# Patient Record
Sex: Female | Born: 1935
Health system: Southern US, Community
[De-identification: ages and names within clinical notes are randomized; demographics above are authoritative.]

## PROBLEM LIST (undated history)

## (undated) DIAGNOSIS — J309 Allergic rhinitis, unspecified: Secondary | ICD-10-CM

## (undated) DIAGNOSIS — R55 Syncope and collapse: Secondary | ICD-10-CM

## (undated) DIAGNOSIS — I10 Essential (primary) hypertension: Secondary | ICD-10-CM

## (undated) DIAGNOSIS — M159 Polyosteoarthritis, unspecified: Secondary | ICD-10-CM

## (undated) DIAGNOSIS — F419 Anxiety disorder, unspecified: Secondary | ICD-10-CM

## (undated) DIAGNOSIS — N12 Tubulo-interstitial nephritis, not specified as acute or chronic: Secondary | ICD-10-CM

## (undated) DIAGNOSIS — E785 Hyperlipidemia, unspecified: Secondary | ICD-10-CM

## (undated) DIAGNOSIS — K635 Polyp of colon: Secondary | ICD-10-CM

## (undated) DIAGNOSIS — N6019 Diffuse cystic mastopathy of unspecified breast: Secondary | ICD-10-CM

## (undated) DIAGNOSIS — K219 Gastro-esophageal reflux disease without esophagitis: Secondary | ICD-10-CM

## (undated) DIAGNOSIS — E039 Hypothyroidism, unspecified: Secondary | ICD-10-CM

## (undated) DIAGNOSIS — R42 Dizziness and giddiness: Secondary | ICD-10-CM

## (undated) HISTORY — DX: Polyosteoarthritis, unspecified: M15.9

## (undated) HISTORY — DX: Anxiety disorder, unspecified: F41.9

## (undated) HISTORY — DX: Polyp of colon: K63.5

## (undated) HISTORY — DX: Essential (primary) hypertension: I10

## (undated) HISTORY — DX: Hyperlipidemia, unspecified: E78.5

## (undated) HISTORY — DX: Syncope and collapse: R55

## (undated) HISTORY — DX: Gastro-esophageal reflux disease without esophagitis: K21.9

## (undated) HISTORY — DX: Allergic rhinitis, unspecified: J30.9

## (undated) HISTORY — DX: Hypothyroidism, unspecified: E03.9

## (undated) HISTORY — DX: Tubulo-interstitial nephritis, not specified as acute or chronic: N12

## (undated) HISTORY — DX: Diffuse cystic mastopathy of unspecified breast: N60.19

## (undated) HISTORY — DX: Dizziness and giddiness: R42

---

## 1994-07-02 HISTORY — PX: THYROIDECTOMY, PARTIAL: SHX18

## 1999-03-03 ENCOUNTER — Encounter: Payer: Self-pay | Admitting: Internal Medicine

## 1999-03-03 LAB — CONVERTED CEMR LAB

## 2002-04-22 ENCOUNTER — Encounter: Payer: Self-pay | Admitting: Gastroenterology

## 2002-05-11 ENCOUNTER — Inpatient Hospital Stay (HOSPITAL_COMMUNITY): Admission: EM | Admit: 2002-05-11 | Discharge: 2002-05-12 | Payer: Self-pay | Admitting: Emergency Medicine

## 2003-10-25 ENCOUNTER — Encounter: Payer: Self-pay | Admitting: Gastroenterology

## 2004-07-12 ENCOUNTER — Ambulatory Visit: Payer: Self-pay | Admitting: Internal Medicine

## 2004-08-18 ENCOUNTER — Ambulatory Visit: Payer: Self-pay | Admitting: Internal Medicine

## 2004-08-24 ENCOUNTER — Ambulatory Visit: Payer: Self-pay | Admitting: Family Medicine

## 2005-01-10 ENCOUNTER — Ambulatory Visit: Payer: Self-pay | Admitting: Internal Medicine

## 2005-02-16 ENCOUNTER — Ambulatory Visit: Payer: Self-pay | Admitting: Internal Medicine

## 2005-03-08 ENCOUNTER — Encounter: Payer: Self-pay | Admitting: Internal Medicine

## 2005-03-19 ENCOUNTER — Ambulatory Visit: Payer: Self-pay | Admitting: Internal Medicine

## 2005-03-21 ENCOUNTER — Ambulatory Visit: Payer: Self-pay | Admitting: Internal Medicine

## 2005-07-19 ENCOUNTER — Ambulatory Visit: Payer: Self-pay | Admitting: Internal Medicine

## 2006-01-18 ENCOUNTER — Ambulatory Visit: Payer: Self-pay | Admitting: Internal Medicine

## 2006-05-21 ENCOUNTER — Ambulatory Visit: Payer: Self-pay | Admitting: Internal Medicine

## 2006-10-01 ENCOUNTER — Ambulatory Visit: Payer: Self-pay | Admitting: Internal Medicine

## 2006-10-01 LAB — CONVERTED CEMR LAB
Albumin: 4 g/dL (ref 3.5–5.2)
BUN: 14 mg/dL (ref 6–23)
CO2: 33 meq/L — ABNORMAL HIGH (ref 19–32)
Chloride: 100 meq/L (ref 96–112)
Creatinine, Ser: 0.9 mg/dL (ref 0.4–1.2)
GFR calc non Af Amer: 64 mL/min
Sodium: 140 meq/L (ref 135–145)

## 2007-02-21 ENCOUNTER — Encounter: Payer: Self-pay | Admitting: Internal Medicine

## 2007-03-17 ENCOUNTER — Encounter: Payer: Self-pay | Admitting: Internal Medicine

## 2007-03-17 DIAGNOSIS — Z860100 Personal history of colon polyps, unspecified: Secondary | ICD-10-CM | POA: Insufficient documentation

## 2007-03-17 DIAGNOSIS — N6019 Diffuse cystic mastopathy of unspecified breast: Secondary | ICD-10-CM

## 2007-03-17 DIAGNOSIS — I10 Essential (primary) hypertension: Secondary | ICD-10-CM

## 2007-03-17 DIAGNOSIS — K219 Gastro-esophageal reflux disease without esophagitis: Secondary | ICD-10-CM | POA: Insufficient documentation

## 2007-03-17 DIAGNOSIS — M810 Age-related osteoporosis without current pathological fracture: Secondary | ICD-10-CM | POA: Insufficient documentation

## 2007-04-02 ENCOUNTER — Ambulatory Visit: Payer: Self-pay | Admitting: Internal Medicine

## 2007-04-02 DIAGNOSIS — R32 Unspecified urinary incontinence: Secondary | ICD-10-CM

## 2007-10-06 ENCOUNTER — Ambulatory Visit: Payer: Self-pay | Admitting: Internal Medicine

## 2007-10-07 LAB — CONVERTED CEMR LAB
BUN: 23 mg/dL (ref 6–23)
CO2: 26 meq/L (ref 19–32)
Chloride: 104 meq/L (ref 96–112)
Creatinine, Ser: 0.79 mg/dL (ref 0.40–1.20)
Eosinophils Relative: 5 % (ref 0–5)
Glucose, Bld: 79 mg/dL (ref 70–99)
HCT: 41.8 % (ref 36.0–46.0)
Hemoglobin: 13.6 g/dL (ref 12.0–15.0)
Lymphocytes Relative: 32 % (ref 12–46)
Monocytes Absolute: 0.4 10*3/uL (ref 0.1–1.0)
Monocytes Relative: 8 % (ref 3–12)
Neutro Abs: 2.7 10*3/uL (ref 1.7–7.7)
RBC: 4.54 M/uL (ref 3.87–5.11)

## 2007-12-23 ENCOUNTER — Telehealth: Payer: Self-pay | Admitting: Internal Medicine

## 2008-04-16 ENCOUNTER — Ambulatory Visit: Payer: Self-pay | Admitting: Internal Medicine

## 2008-09-21 ENCOUNTER — Encounter (INDEPENDENT_AMBULATORY_CARE_PROVIDER_SITE_OTHER): Payer: Self-pay | Admitting: *Deleted

## 2008-09-22 ENCOUNTER — Ambulatory Visit: Payer: Self-pay | Admitting: Internal Medicine

## 2008-09-23 LAB — CONVERTED CEMR LAB
ALT: 22 units/L (ref 0–35)
Basophils Relative: 0.5 % (ref 0.0–3.0)
CO2: 30 meq/L (ref 19–32)
Chloride: 106 meq/L (ref 96–112)
Direct LDL: 181.6 mg/dL
Eosinophils Absolute: 0.2 10*3/uL (ref 0.0–0.7)
Free T4: 1 ng/dL (ref 0.6–1.6)
GFR calc non Af Amer: 50.52 mL/min (ref >60)
Glucose, Bld: 84 mg/dL (ref 70–99)
HCT: 37.2 % (ref 36.0–46.0)
HDL: 53.1 mg/dL (ref >39.00)
Hemoglobin: 12.6 g/dL (ref 12.0–15.0)
Lymphocytes Relative: 25.4 % (ref 12.0–46.0)
Monocytes Absolute: 0.6 10*3/uL (ref 0.1–1.0)
Neutrophils Relative %: 61.6 % (ref 43.0–77.0)
Platelets: 211 10*3/uL (ref 150.0–400.0)
Potassium: 4 meq/L (ref 3.5–5.1)
Sodium: 142 meq/L (ref 135–145)
TSH: 1.98 microintl units/mL (ref 0.35–5.50)
Total Bilirubin: 0.9 mg/dL (ref 0.3–1.2)
Total CHOL/HDL Ratio: 5
Triglycerides: 108 mg/dL (ref 0.0–149.0)
VLDL: 21.6 mg/dL (ref 0.0–40.0)

## 2009-03-22 ENCOUNTER — Encounter (INDEPENDENT_AMBULATORY_CARE_PROVIDER_SITE_OTHER): Payer: Self-pay | Admitting: *Deleted

## 2009-03-29 ENCOUNTER — Ambulatory Visit: Payer: Self-pay | Admitting: Internal Medicine

## 2009-03-30 ENCOUNTER — Telehealth: Payer: Self-pay | Admitting: Gastroenterology

## 2009-09-27 ENCOUNTER — Ambulatory Visit: Payer: Self-pay | Admitting: Internal Medicine

## 2009-09-28 LAB — CONVERTED CEMR LAB
ALT: 16 units/L (ref 0–35)
AST: 19 units/L (ref 0–37)
Albumin: 4.6 g/dL (ref 3.5–5.2)
Alkaline Phosphatase: 65 units/L (ref 39–117)
Basophils Absolute: 0 10*3/uL (ref 0.0–0.1)
Basophils Relative: 1 % (ref 0–1)
Free T4: 1.28 ng/dL (ref 0.80–1.80)
Glucose, Bld: 92 mg/dL (ref 70–99)
Hemoglobin: 14.5 g/dL (ref 12.0–15.0)
Lymphocytes Relative: 24 % (ref 12–46)
MCHC: 33.9 g/dL (ref 30.0–36.0)
Neutro Abs: 3.9 10*3/uL (ref 1.7–7.7)
Neutrophils Relative %: 64 % (ref 43–77)
Platelets: 182 10*3/uL (ref 150–400)
Potassium: 4 meq/L (ref 3.5–5.3)
RDW: 12.8 % (ref 11.5–15.5)
Sodium: 141 meq/L (ref 135–145)
Total Bilirubin: 0.5 mg/dL (ref 0.3–1.2)
Total Protein: 7.3 g/dL (ref 6.0–8.3)

## 2010-04-04 ENCOUNTER — Ambulatory Visit: Payer: Self-pay | Admitting: Internal Medicine

## 2010-08-03 NOTE — Assessment & Plan Note (Signed)
Summary: FOLLOW UP / LFW   Vital Signs:  Patient profile:   77 year old female Weight:      162 pounds BMI:     27.91 Temp:     97.5 degrees F oral Pulse rate:   60 / minute Pulse rhythm:   regular BP sitting:   138 / 68  (left arm) Cuff size:   regular  Vitals Entered By: Mervin Hack CMA Duncan Dull) (September 27, 2009 10:26 AM) CC: 6 month follow-up   History of Present Illness: Doing well No new concerns  Still troubled with urinary incontinence Unable to get in house without losing urine feels it is the spasm Meds haven't helped Just wears a pad Doesn't keep her from going out or doing Fine if she voids regularly  doing fine with BP med No chest pain No sig headaches---occ allergy related  uses OTC allergy med ?loratadine This controls symptoms  Still on calcium and vitamin D doing some weight work walks only a little  Heartburn controlled by meds  Allergies: 1)  Detrol La (Tolterodine Tartrate) 2)  Buspar 3)  Ditropan  Past History:  Past medical, surgical, family and social histories (including risk factors) reviewed for relevance to current acute and chronic problems.  Past Medical History: Reviewed history from 04/16/2008 and no changes required. Colonic polyps, hx of GERD Hyperlipidemia Hypertension Hypothyroidism--post-op Osteoporosis Fibrocystic breasts Urinary incontinence  Past Surgical History: Reviewed history from 03/17/2007 and no changes required. Thyroidectomy, partial 1996 CP- Cath benign 01/03 Echo- normal LV fx, diast. dys fx 01/03 Rectal bleed - ? post polypectomy 11/03 DEXA 10/00 Echo- LV EF 60%, mild MR/TR 09/06  Family History: Reviewed history from 03/17/2007 and no changes required. Sister with HTN Sister with melanoma  Social History: Reviewed history from 03/17/2007 and no changes required. Marital Status: Widowed Children: 2 adopted children Occupation: Retired - Midwife Former Smoker--quit  1970 Alcohol use-no  Review of Systems       [employer] isup 5#---has had recent stress and then eats more Son giving her trouble--nothing new though sleeps okay  Physical Exam  General:  alert and normal appearance.   Neck:  supple, no masses, no thyromegaly, and no cervical lymphadenopathy.   Lungs:  normal respiratory effort and normal breath sounds.   Heart:  normal rate, regular rhythm, no murmur, and no gallop.   Abdomen:  soft and non-tender.   Msk:  no joint tenderness and no joint swelling.   Pulses:  1+ in feet Extremities:  no edema Neurologic:  alert & oriented X3, strength normal in all extremities, and gait normal.   Skin:  no rashes and no suspicious lesions.   Psych:  normally interactive, good eye contact, not anxious appearing, and not depressed appearing.     Impression & Recommendations:  Problem # 1:  HYPERTENSION (ICD-401.9) Assessment Unchanged  good control due for labs  Her updated medication list for this problem includes:    Triamterene-hctz 37.5-25 Mg Tabs (Triamterene-hctz) .Marland Kitchen... 1 tab daily  BP today: 138/68 Prior BP: 140/70 (03/29/2009)  Labs Reviewed: K+: 4.0 (09/22/2008) Creat: : 1.1 (09/22/2008)   Chol: 259 (09/22/2008)   HDL: 53.10 (09/22/2008)   TG: 108.0 (09/22/2008)  Orders: TLB-Renal Function Panel (80069-RENAL) TLB-CBC Platelet - w/Differential (85025-CBCD) TLB-Hepatic/Liver Function Pnl (80076-HEPATIC) Venipuncture (60454)  Problem # 2:  HYPOTHYROIDISM, POST-OP (ICD-244.9) Assessment: Comment Only  due for labs has not needed meds  Orders: TLB-TSH (Thyroid Stimulating Hormone) (84443-TSH) TLB-T4 (Thyrox), Free [phone])  Problem #  3:  OSTEOPOROSIS (ICD-733.00) Assessment: Unchanged on appropriate meds discussed weight bearing exercise  Her updated medication list for this problem includes:    Calcium 600/vitamin D 600-400 Mg-unit Tabs (Calcium carbonate-vitamin d) .Marland Kitchen... Take 1 by mouth two times a day    Vitamin  D3 400 Unit Tabs (Cholecalciferol) .Marland Kitchen... Take 1 by mouth once daily  Problem # 4:  GERD (ICD-530.81) Assessment: Unchanged fine on the med  Her updated medication list for this problem includes:    Omeprazole 20 Mg Cpdr (Omeprazole) .Marland Kitchen... Take one by mouth two times a day  Problem # 5:  URINARY INCONTINENCE (ICD-788.30) Assessment: Unchanged uses pads and behavioral approaches  Complete Medication List: 1)  Triamterene-hctz 37.5-25 Mg Tabs (Triamterene-hctz) .Marland Kitchen.. 1 tab daily 2)  Lorazepam 0.5 Mg Tabs (Lorazepam) .... Take 1/2- 1 by mouth two times a day as needed 3)  Omeprazole 20 Mg Cpdr (Omeprazole) .... Take one by mouth two times a day 4)  Fish Oil Concentrate 1000 Mg Caps (Omega-3 fatty acids) .... Take one by mouth once a day 5)  Centrum Silver Tabs (Multiple vitamins-minerals) .... Take 1 tablet by mouth once a day 6)  Vitamin C Cr 500 Mg Cr-caps (Ascorbic acid) .Marland Kitchen.. 1 daily by mouth 7)  Calcium 600/vitamin D 600-400 Mg-unit Tabs (Calcium carbonate-vitamin d) .... Take 1 by mouth two times a day 8)  Vitamin D3 400 Unit Tabs (Cholecalciferol) .... Take 1 by mouth once daily 9)  B-50 Complex Tabs (B complex-folic acid) .... Take 1 by mouth once daily 10)  Turmeric Curcumin Caps (Misc natural products) .... Once daily  Patient Instructions: 1)  Please schedule a follow-up appointment in 6 months .   Current Allergies (reviewed today): DETROL LA (TOLTERODINE TARTRATE) BUSPAR DITROPAN  Appended Document: FOLLOW UP / LFW

## 2010-08-03 NOTE — Assessment & Plan Note (Signed)
Summary: 6 MONTH FOLLOW UP/RBH   Vital Signs:  Patient profile:   77 year old female Weight:      158 pounds Temp:     97.5 degrees F oral Pulse rate:   60 / minute Pulse rhythm:   regular BP sitting:   128 / 68  (left arm) Cuff size:   regular  Vitals Entered By: Mervin Hack CMA Duncan Dull) (April 04, 2010 10:54 AM) CC: 6 month follow-up   History of Present Illness: Doing "great" Good recent reports from dentist and eye doctor  Has had some spells of diarrhea over the summer lasted 2 months tried align and immodium Stayed runny and as often as 2-3 times per day No new supplements  Stomach has been quiet discussed decreasing the omeprazole  No chest pain  No SOB  Still with ongoing urinary incontinence May be somewhat worse just uses pad doesn't limit her activity  Allergies: 1)  Detrol La (Tolterodine Tartrate) 2)  Buspar 3)  Ditropan  Past History:  Past medical, surgical, family and social histories (including risk factors) reviewed for relevance to current acute and chronic problems.  Past Medical History: Reviewed history from 04/16/2008 and no changes required. Colonic polyps, hx of GERD Hyperlipidemia Hypertension Hypothyroidism--post-op Osteoporosis Fibrocystic breasts Urinary incontinence  Past Surgical History: Reviewed history from 03/17/2007 and no changes required. Thyroidectomy, partial 1996 CP- Cath benign 01/03 Echo- normal LV fx, diast. dys fx 01/03 Rectal bleed - ? post polypectomy 11/03 DEXA 10/00 Echo- LV EF 60%, mild MR/TR 09/06  Family History: Reviewed history from 03/17/2007 and no changes required. Sister with HTN Sister with melanoma  Social History: Reviewed history from 03/17/2007 and no changes required. Marital Status: Widowed Children: 2 adopted children Occupation: Retired - Midwife Former Smoker--quit 1970 Alcohol use-no  Review of Systems       sleeps well appetite is fine weight is  fairly stable-- down 4# this time  Physical Exam  General:  alert and normal appearance.   Neck:  supple, no masses, no thyromegaly, no carotid bruits, and no cervical lymphadenopathy.   Lungs:  normal respiratory effort, no intercostal retractions, no accessory muscle use, and normal breath sounds.   Heart:  normal rate, regular rhythm, no murmur, and no gallop.   Abdomen:  soft and non-tender.   Msk:  no joint tenderness and no joint swelling.   Extremities:  no edema Psych:  normally interactive, good eye contact, not anxious appearing, and not depressed appearing.     Impression & Recommendations:  Problem # 1:  HYPERTENSION (ICD-401.9) Assessment Unchanged good control no changes needed labs next time  Her updated medication list for this problem includes:    Triamterene-hctz 37.5-25 Mg Tabs (Triamterene-hctz) .Marland Kitchen... 1 tab daily  BP today: 128/68 Prior BP: 138/68 (09/27/2009)  Labs Reviewed: K+: 4.0 (09/27/2009) Creat: : 0.87 (09/27/2009)   Chol: 259 (09/22/2008)   HDL: 53.10 (09/22/2008)   TG: 108.0 (09/22/2008)  Problem # 2:  URINARY INCONTINENCE (ICD-788.30) Assessment: Unchanged does okay with just the pads  Problem # 3:  OSTEOPOROSIS (ICD-733.00) Assessment: Unchanged mild balance issues tries to stay active continue current meds  Her updated medication list for this problem includes:    Calcium 600/vitamin D 600-400 Mg-unit Tabs (Calcium carbonate-vitamin d) .Marland Kitchen... Take 1 by mouth two times a day    Vitamin D3 400 Unit Tabs (Cholecalciferol) .Marland Kitchen... Take 1 by mouth once daily  Problem # 4:  GERD (ICD-530.81) Assessment: Unchanged will try decreasing to  daily unclear if this could be related to loose bowels  Her updated medication list for this problem includes:    Omeprazole 20 Mg Cpdr (Omeprazole) .Marland Kitchen... Take one by mouth once to twice a day  Complete Medication List: 1)  Triamterene-hctz 37.5-25 Mg Tabs (Triamterene-hctz) .Marland Kitchen.. 1 tab daily 2)  Omeprazole  20 Mg Cpdr (Omeprazole) .... Take one by mouth once to twice a day 3)  Fish Oil Concentrate 1000 Mg Caps (Omega-3 fatty acids) .... Take one by mouth once a day 4)  Centrum Silver Tabs (Multiple vitamins-minerals) .... Take 1 tablet by mouth once a day 5)  Vitamin C Cr 500 Mg Cr-caps (Ascorbic acid) .Marland Kitchen.. 1 daily by mouth 6)  Calcium 600/vitamin D 600-400 Mg-unit Tabs (Calcium carbonate-vitamin d) .... Take 1 by mouth two times a day 7)  Vitamin D3 400 Unit Tabs (Cholecalciferol) .... Take 1 by mouth once daily 8)  B-50 Complex Tabs (B complex-folic acid) .... Take 1 by mouth once daily 9)  Lecithin Gran (Lecithin) .... Take 1 by mouth two times a day  Patient Instructions: 1)  Please try taking omeprazole just once a day 2)  Please schedule a follow-up appointment in 6 months .   Current Allergies (reviewed today): DETROL LA (TOLTERODINE TARTRATE) BUSPAR DITROPAN  Appended Document: 6 MONTH FOLLOW UP/RBH    Clinical Lists Changes  Orders: Added new Service order of Flu Vaccine 1yrs + MEDICARE PATIENTS (W1191) - Signed Added new Service order of Administration Flu vaccine - MCR (Y7829) - Signed Observations: Added new observation of FLU VAX#1VIS: 01/24/10 version given April 04, 2010. (04/04/2010 11:11) Added new observation of FLU VAXLOT: [id number] (04/04/2010 11:11) Added new observation of FLU VAX EXP: 12/30/2010 (04/04/2010 11:11) Added new observation of FLU VAXBY: DeShannon Smith CMA (AAMA) (04/04/2010 11:11) Added new observation of FLU VAXRTE: IM (04/04/2010 11:11) Added new observation of FLU VAX DSE: 0.5 ml (04/04/2010 11:11) Added new observation of FLU VAXMFR: GlaxoSmithKline (04/04/2010 11:11) Added new observation of FLU VAX SITE: left deltoid (04/04/2010 11:11) Added new observation of FLU VAX: Fluvax MCR (04/04/2010 11:11)       Influenza Vaccine    Vaccine Type: Fluvax MCR    Site: left deltoid    Mfr: GlaxoSmithKline    Dose: 0.5 ml    Route: IM     Given by: Mervin Hack CMA (AAMA)    Exp. Date: 12/30/2010    Lot #: [id number]    VIS given: 01/24/10 version given April 04, 2010.  Flu Vaccine Consent Questions    Do you have a history of severe allergic reactions to this vaccine? no    Any prior history of allergic reactions to egg and/or gelatin? no    Do you have a sensitivity to the preservative Thimersol? no    Do you have a past history of Guillan-Barre Syndrome? no    Do you currently have an acute febrile illness? no    Have you ever had a severe reaction to latex? no    Vaccine information given and explained to patient? yes    Are you currently pregnant? no

## 2010-08-16 ENCOUNTER — Encounter: Payer: Self-pay | Admitting: Internal Medicine

## 2010-10-10 ENCOUNTER — Ambulatory Visit (INDEPENDENT_AMBULATORY_CARE_PROVIDER_SITE_OTHER): Payer: Medicare Other | Admitting: Internal Medicine

## 2010-10-10 ENCOUNTER — Encounter: Payer: Self-pay | Admitting: Internal Medicine

## 2010-10-10 VITALS — BP 112/70 | HR 64 | Temp 97.7°F | Ht 64.0 in | Wt 162.0 lb

## 2010-10-10 DIAGNOSIS — R32 Unspecified urinary incontinence: Secondary | ICD-10-CM

## 2010-10-10 DIAGNOSIS — I10 Essential (primary) hypertension: Secondary | ICD-10-CM

## 2010-10-10 DIAGNOSIS — M810 Age-related osteoporosis without current pathological fracture: Secondary | ICD-10-CM

## 2010-10-10 DIAGNOSIS — K219 Gastro-esophageal reflux disease without esophagitis: Secondary | ICD-10-CM

## 2010-10-10 LAB — HEPATIC FUNCTION PANEL
ALT: 15 U/L (ref 0–35)
Albumin: 4.1 g/dL (ref 3.5–5.2)
Bilirubin, Direct: 0.1 mg/dL (ref 0.0–0.3)
Total Protein: 6.8 g/dL (ref 6.0–8.3)

## 2010-10-10 LAB — CBC WITH DIFFERENTIAL/PLATELET
Basophils Relative: 0.5 % (ref 0.0–3.0)
Eosinophils Absolute: 0.3 10*3/uL (ref 0.0–0.7)
Hemoglobin: 15.4 g/dL — ABNORMAL HIGH (ref 12.0–15.0)
Lymphocytes Relative: 29.2 % (ref 12.0–46.0)
MCHC: 34.5 g/dL (ref 30.0–36.0)
Monocytes Relative: 8.2 % (ref 3.0–12.0)
Neutrophils Relative %: 57.6 % (ref 43.0–77.0)
RBC: 4.78 Mil/uL (ref 3.87–5.11)
WBC: 5.6 10*3/uL (ref 4.5–10.5)

## 2010-10-10 LAB — BASIC METABOLIC PANEL
BUN: 17 mg/dL (ref 6–23)
CO2: 31 mEq/L (ref 19–32)
Chloride: 104 mEq/L (ref 96–112)
Potassium: 4 mEq/L (ref 3.5–5.1)

## 2010-10-10 NOTE — Progress Notes (Signed)
  Subjective:    Patient ID: Yvette Thomas, female    DOB: 1924-12-10, 77 y.o.   MRN: [id number]  HPI DOing "super" Some "old age aches and pain" Stays active--cleans and does laundry, etc Has a garden also Exercises every morning---routine including jogging in place Considering yoga--has DVD Discussed walking  Has been able to cut down on omeprazole Taking only one a day Also eats an apple a day Bowels are more regular----tends to get loose stools in the summer Does increase fruits then  Still with incontinence Wears pads Gets spasms at times---losing urine just standing up from sitting position  No chest pain No SOB No edema Does gets some dull sinus headaches in AM-----sneezes and rhinorrhea. Better with moving around or an advil  Past Medical History  Diagnosis Date  . Colon polyps   . GERD (gastroesophageal reflux disease)   . Hyperlipidemia   . Hypertension   . Hypothyroidism   . Osteoporosis   . Fibrocystic breast   . Urinary incontinence     Past Surgical History  Procedure Date  . Thyroidectomy, partial 1996    Family History  Problem Relation Age of Onset  . Hypertension Sister   . Melanoma Sister     History   Social History  . Marital Status: Married    Spouse Name: N/A    Number of Children: 2  . Years of Education: N/A   Occupational History  . RETIRED     [employer]   Social History Main Topics  . Smoking status: Former Smoker    Quit date: 07/02/1968  . Smokeless tobacco: Not on file  . Alcohol Use: No  . Drug Use: Not on file  . Sexually Active: Not on file   Other Topics Concern  . Not on file   Social History Narrative  . No narrative on file   Review of Systems Appetite is fine Weight is stable Sleeps well---naps in day also    Objective:   Physical Exam  Constitutional: She appears well-developed and well-nourished.  Neck: Normal range of motion. No thyromegaly present.  Cardiovascular: Normal rate,  regular rhythm, normal heart sounds and intact distal pulses.  Exam reveals no gallop.   No murmur heard. Pulmonary/Chest: Effort normal and breath sounds normal. No respiratory distress. She has no wheezes. She has no rales.  Abdominal: Soft. She exhibits no mass. There is no tenderness.  Musculoskeletal: Normal range of motion. She exhibits no edema and no tenderness.  Lymphadenopathy:    She has no cervical adenopathy.  Psychiatric: She has a normal mood and affect. Her behavior is normal. Judgment and thought content normal.          Assessment & Plan:

## 2010-10-10 NOTE — Patient Instructions (Signed)
If your heartburn is quiet, please try to skip days with the omeprazole and slowly reduce how often you take it

## 2010-11-17 NOTE — H&P (Signed)
Yvette Thomas, Yvette Thomas                          ACCOUNT NO.:  [id number]   MEDICAL RECORD NO.:  [id number]                   PATIENT TYPE:  EMS   LOCATION:  ED                                   FACILITY:  [facility]   PHYSICIAN:  Wilhemina Bonito. Marina Goodell, M.D. [facility]             DATE OF BIRTH:  [birth year]   DATE OF ADMISSION:  05/11/2002  DATE OF DISCHARGE:                                HISTORY & PHYSICAL   CHIEF COMPLAINT:  Rectal bleeding.   HISTORY OF PRESENT ILLNESS:  The patient is a very nice 77 year old white  female, generally in good health, known to Dr. Orlie Dakin, who has a history of  chronic  gastroesophageal reflux disease, hemorrhoids, partial thyroidectomy  for a benign tumor in 1996. The patient had been referred to Dr. Arlyce Dice for  screening colonoscopy which she underwent on April 22, 2002. She was found  to have three polyps, two small polyps in the right colon which were  removed, and one larger polyp approximately 20 mm in the splenic flexure  which was removed with saline injection, snare and cautery. She was also  noted to have left colon diverticula. A biopsy from the larger polyp is  consistent with a benign adenoma.   The patient relates that she did have some left upper quadrant pain and  bloating for two to three days after the procedure, no severe pain. She was  able to eat. She did not develop any fever, nausea or vomiting, etc. She did  become constipated and then the first bowel movement had a small amount of  bright red blood on the tissue with straining. Since that time she has  intermittently noted some small amounts of bright red blood on the tissue,  but normal appearing bowel movements and  has not had any residual  discomfort. Three days ago she apparently saw some dark blood on the tissue  again with a normal looking bowel movement, and then this morning had two  grossly bloody bowel movements with a lot more  blood than she had been  seeing with blood in the  commode mixed in with the stool and describes it as  purplish red with clots. This occurred on two occasions and she then went to  see Dr. Orlie Dakin. She had no associated pain, cramping, diaphoresis, light  headedness, shortness of breath, etc. She was seen and evaluated by Dr.  Orlie Dakin. Dr. Arlyce Dice was contacted and she was referred to the emergency room  for evaluation.   She is noted to be hemodynamically stable but on rectal exam has grossly  bloody maroonish stool and is admitted for supportive management with  probable post polypectomy bleed. She has not been on any aspirin or  nonsteroidal anti-inflammatory drugs.   CURRENT MEDICATIONS:  1. Prilosec 20 p.o. q.d.  2. Actonel 35 every week.  3. Calcium q.d.  4. Vitamin E.  5. Zinc.  6. Folic acid.  7. Garlic.  8. Vitamin C.  9. Fish oil on a daily basis.   ALLERGIES:  No known drug allergies.   PAST MEDICAL HISTORY:  As outlined above.   SOCIAL HISTORY:  The patient is single. Lives alone. Her daughter  accompanies her today. No tobacco and no ETOH. She is retired.   FAMILY HISTORY:  Negative for GI disease.   REVIEW OF SYSTEMS:  CARDIOVASCULAR:  Negative. The patient did have a  catheterization last year which was negative.  PULMONARY:  Negative. GU:  Pertinent for some urinary stress incontinence.  GI:  As above.   PHYSICAL EXAMINATION:  GENERAL:  A well developed elderly white female in no  acute distress. Color is good.  SKIN:  Warm and dry.  VITAL SIGNS:  Blood pressure 159/87, pulse in the 70s, respirations 18,  temperature 96.8.  HEENT:  Normocephalic, atraumatic. EOMI. PERRLA.  Sclerae anicteric.  NECK:  Supple without nodes. She does have an incisional scar on the  anterior neck.  CARDIOVASCULAR:  Regular rate and rhythm with S1 and S2.  PULMONARY:  Clear.  ABDOMEN:  Soft, nontender. There are no palpable mass or hepatosplenomegaly.  Bowel sounds are active.  RECTAL:  Exam reveals grossly bloody maroonish  appearing stool.  There is no  external lesion or hemorrhoids noted. She is not tender on exam.  EXTREMITIES:  No cyanosis, clubbing or edema.  NEUROLOGIC:  Grossly nonfocal.   LABORATORY DATA:  Pending at the time of admission.   IMPRESSION:  109. A 77 year old white female with lower GI bleed, 19 days status post     polypectomy, suspect post polypectomy bleed from splenic flexure polyp     site, rule out diverticular bleed, although feel less likely.  2. Colon polyps.  3. Gastroesophageal reflux disease.  4. Status post partial thyroidectomy.   PLAN:  The patient is admitted to the service of Dr. Yancey Flemings for IV fluid  hydration. She will be placed at bed rest and on bowel rest with clear  liquids. Will transfuse her as indicated. Serial H&Hs if she has persistent  significant bleed. We will plan a repeat bowel prep and recauterization.  Hopefully this will not be necessary and she can be managed conservatively.        Amy Esterwood, P.A.-C. [facility]                John N. Marina Goodell, M.D. [facility]    AE/MEDQ  D:  05/11/2002  T:  05/11/2002  Job:  [id number]   cc:   Dr. Orlie Dakin

## 2010-11-17 NOTE — Discharge Summary (Signed)
Yvette Thomas, Yvette Thomas                          ACCOUNT NO.:  [id number]   MEDICAL RECORD NO.:  [id number]                   PATIENT TYPE:  INP   LOCATION:  0471                                 FACILITY:  [facility]   PHYSICIAN:  Wilhemina Bonito. Marina Goodell, M.D. [facility]             DATE OF BIRTH:  [birth year]   DATE OF ADMISSION:  05/11/2002  DATE OF DISCHARGE:  05/12/2002                                 DISCHARGE SUMMARY   ADMITTING DIAGNOSES:  43. A 77 year old white female with acute lower gastrointestinal bleed 19     days status post polypectomy, suspect post polypectomy bleed from splenic     flexure polyp site.  Rule out diverticular bleed, though less likely.  2. History of colon polyps.  3. Gastroesophageal reflux disease.  4. Status post partial thyroidectomy.   DISCHARGE DIAGNOSES:  74. A 77 year old white female, stable status post self limited post     polypectomy bleed with mild anemia and no transfusion requirement.  2. Other diagnoses as listed above.   CONSULTATIONS:  None.   PROCEDURE:  None.   BRIEF HISTORY:  The patient is a very nice 77 year old white female  generally in good health primary patient of Dr. Alphonsus Sias.  She had been  referred to Dr. Arlyce Dice for screening colonoscopy which she underwent on  April 22, 2002.  She was found to have three polyps, two small polyps in  the right colon which were removed and one larger polyp approximately 20 mm  in the splenic flexure which was also removed with saline injections and  cautery.  She was also noted to have left colon diverticula.  Biopsy from  the larger polyp is consistent with a benign adenoma.   The patient related that she had some left upper quadrant discomfort and  bloating for two to three days after the procedure, but no severe pain.  She  was able to eat.  Did not develop any fever, nausea, vomiting, etc. and this  discomfort resolved.  She did become constipated and with her first bowel  movement a few days later  had a small amount of bright red blood on the  tissue.  Since that time she says intermittently she has seen small amounts  of bright red blood, but normal appearing bowel movements.  Three days prior  to admission she noted some dark blood on her tissue and then on the morning  of admission had two grossly bloody bowel movements.  She was seen by Dr.  Alphonsus Sias and then referred for admission.   LABORATORY STUDIES:  On May 11, 2002 WBC 6.5, hemoglobin 12.5,  hematocrit 36.6, MCV 90.2, platelets 222,000.  Serial values were drawn.  On  November 11 hemoglobin 12.3, hematocrit 35.7.  Follow-up later that day  hemoglobin 13, hematocrit of 38.  Pro time 12.8, INR 0.9, PTT 32.   X-ray studies:  None.   HOSPITAL COURSE:  The  patient was admitted through [facility]  Room to the service of Dr. Yancey Flemings who was covering the hospital.  She  was initially placed bed rest, started on a clear liquid diet, started on IV  fluids and serial H&Hs were obtained.  Her hemoglobin was 12.5 on admission.  She did not manifest any further active bleeding through the remainder of  her hospitalization.  The following morning her hemoglobin remained stable  and her diet was advanced.  She tolerated this without difficulty.  We  checked one more hemoglobin in the afternoon of discharge, again stable, and  she was allowed discharge to home with instructions to limit her activity  level over the next few days, stay off of aspirin and all anti-inflammatory  medication.  She was asked to follow a low residue diet for two weeks and  use a stool softener and/or Milk of Magnesia to prevent constipation.  We  plan to check a follow-up hemoglobin via Dr. Alphonsus Sias office later in the week  and she is scheduled a follow-up appointment with Dr. Arlyce Dice in the office  on Wednesday, November 19.  She was to call for any problems in the interim.   CONDITION ON DISCHARGE:  Stable.   DISCHARGE MEDICATIONS:  1.  Prilosec 20 mg q.d.  2. Actonel 35 mg q. week as previous.  3. Calcium.  4. Vitamin E.  5. Zinc.  6. Folic acid.  7. Vitamin C.  8. Fish oil as previous.     Amy Esterwood, P.A.-C. [facility]                John N. Marina Goodell, M.D. [facility]    AE/MEDQ  D:  05/19/2002  T:  05/19/2002  Job:  [id number]

## 2010-11-30 ENCOUNTER — Other Ambulatory Visit: Payer: Self-pay | Admitting: Internal Medicine

## 2011-01-10 ENCOUNTER — Other Ambulatory Visit: Payer: Self-pay | Admitting: Internal Medicine

## 2011-04-10 ENCOUNTER — Ambulatory Visit (INDEPENDENT_AMBULATORY_CARE_PROVIDER_SITE_OTHER): Payer: Medicare Other | Admitting: Internal Medicine

## 2011-04-10 ENCOUNTER — Encounter: Payer: Self-pay | Admitting: Internal Medicine

## 2011-04-10 VITALS — BP 133/62 | HR 77 | Temp 98.4°F | Ht 64.0 in | Wt 160.0 lb

## 2011-04-10 DIAGNOSIS — K219 Gastro-esophageal reflux disease without esophagitis: Secondary | ICD-10-CM

## 2011-04-10 DIAGNOSIS — I10 Essential (primary) hypertension: Secondary | ICD-10-CM

## 2011-04-10 DIAGNOSIS — Z23 Encounter for immunization: Secondary | ICD-10-CM

## 2011-04-10 DIAGNOSIS — J309 Allergic rhinitis, unspecified: Secondary | ICD-10-CM | POA: Insufficient documentation

## 2011-04-10 DIAGNOSIS — M810 Age-related osteoporosis without current pathological fracture: Secondary | ICD-10-CM

## 2011-04-10 DIAGNOSIS — M159 Polyosteoarthritis, unspecified: Secondary | ICD-10-CM | POA: Insufficient documentation

## 2011-04-10 DIAGNOSIS — R32 Unspecified urinary incontinence: Secondary | ICD-10-CM

## 2011-04-10 NOTE — Assessment & Plan Note (Signed)
On calcium and vitamin D Does daily exercises

## 2011-04-10 NOTE — Assessment & Plan Note (Signed)
Still a problem but just uses pads

## 2011-04-10 NOTE — Progress Notes (Signed)
Subjective:    Patient ID: Yvette Thomas, female    DOB: [birth year], 77 y.o.   MRN: [id number]  HPI Doing fair but "for the first time in 4 years, I feel old" Has been having a lot of stress this summer Son in accident--foot and shoulder injury that required surgery (scooter in [place])  More arthritis pain in back and neck advil does help Walks okay Still does regular AM exercises---balance, etc  Some sinus problems Feels fuzzy headed Lots of nasal discharge in AM Sneezes, then it clear OTC allergy pill helps  Some memory problems Trouble with directions but doesn't get "lost" in car No functional changes--has put bills to automatic drafts for convenience Lives alone and does all ADLs and instrumental ADLs  No chest pain No edema No SOB occ dull headaches---relates to sinus  No bladder medications Still troubled with urgency and urge incontinence but meds caused side effects Uses several pads a day  Still with reflux Now down to once a day but can't cut it further  Current Outpatient Prescriptions on File Prior to Visit  Medication Sig Dispense Refill  . Ascorbic Acid (VITAMIN C) 500 MG tablet Take 500 mg by mouth daily.        . Calcium Carbonate-Vitamin D (CALCIUM 600+D) 600-400 MG-UNIT per tablet Take 1 tablet by mouth 2 (two) times daily.        . Cholecalciferol (VITAMIN D3) 400 UNITS CAPS Take 1 capsule by mouth daily.        . Lecithin GRAN Use as directed 1 capsule in the mouth or throat daily.        . Multiple Vitamins-Minerals (CENTRUM SILVER PO) Take 1 tablet by mouth daily.        . Omega-3 Fatty Acids (FISH OIL) 1000 MG CAPS Take 1 capsule by mouth daily.        Marland Kitchen triamterene-hydrochlorothiazide (MAXZIDE-25) 37.5-25 MG per tablet TAKE ONE TABLET BY MOUTH EVERY DAY  90 tablet  3  . Vitamins-Lipotropics (B-50 COMPLEX) TABS Take 1 tablet by mouth daily.          Allergies  Allergen Reactions  . Buspirone Hcl     REACTION: Tinnitis, dizzy  .  Oxybutynin Chloride     REACTION: Dried up  . Tolterodine Tartrate     REACTION: Headache    Past Medical History  Diagnosis Date  . Colon polyps   . GERD (gastroesophageal reflux disease)   . Hyperlipidemia   . Hypertension   . Hypothyroidism   . Osteoporosis   . Fibrocystic breast   . Urinary incontinence   . Allergic rhinitis, cause unspecified   . Osteoarthritis of multiple joints     Past Surgical History  Procedure Date  . Thyroidectomy, partial 1996    Family History  Problem Relation Age of Onset  . Hypertension Sister   . Melanoma Sister     History   Social History  . Marital Status: Widowed    Spouse Name: N/A    Number of Children: 2  . Years of Education: N/A   Occupational History  . RETIRED     [employer]   Social History Main Topics  . Smoking status: Former Smoker    Quit date: 07/02/1968  . Smokeless tobacco: Never Used  . Alcohol Use: No  . Drug Use: Not on file  . Sexually Active: Not on file   Other Topics Concern  . Not on file   Social History Narrative  .  No narrative on file   Review of Systems Appetite is fine Weight fairly stable Sleeps okay     Objective:   Physical Exam  Constitutional: She appears well-developed and well-nourished. No distress.  HENT:  Mouth/Throat: Oropharynx is clear and moist. No oropharyngeal exudate.       Pale nasal mucosal swelling  Neck: Normal range of motion. No thyromegaly present.  Cardiovascular: Normal rate, regular rhythm, normal heart sounds and intact distal pulses.  Exam reveals no gallop.   No murmur heard. Pulmonary/Chest: Effort normal and breath sounds normal. No respiratory distress. She has no wheezes. She has no rales.  Abdominal: Soft. There is no tenderness.  Musculoskeletal: She exhibits no edema and no tenderness.  Lymphadenopathy:    She has no cervical adenopathy.  Psychiatric: She has a normal mood and affect. Her behavior is normal. Judgment and  thought content normal.          Assessment & Plan:

## 2011-04-10 NOTE — Assessment & Plan Note (Signed)
Has cut down on PPI Discussed trying days off

## 2011-04-10 NOTE — Assessment & Plan Note (Signed)
BP Readings from Last 3 Encounters:  04/10/11 133/62  10/10/10 112/70  04/04/10 128/68   Good control No changes Labs next time

## 2011-04-10 NOTE — Assessment & Plan Note (Signed)
Mild and not really limiting Does okay with occ OTC NSAID

## 2011-04-10 NOTE — Assessment & Plan Note (Signed)
Discussed Neti pot or continuing OTC antihistamine

## 2011-04-10 NOTE — Patient Instructions (Addendum)
Please try to skip one day a week on the omeprazole. If you do okay after a month, try to skip 2 days per week Please try saline sprays or Neti pot for your nasal congestion

## 2011-10-10 ENCOUNTER — Ambulatory Visit (INDEPENDENT_AMBULATORY_CARE_PROVIDER_SITE_OTHER): Payer: Medicare Other | Admitting: Internal Medicine

## 2011-10-10 ENCOUNTER — Encounter: Payer: Self-pay | Admitting: *Deleted

## 2011-10-10 ENCOUNTER — Encounter: Payer: Self-pay | Admitting: Internal Medicine

## 2011-10-10 VITALS — BP 120/70 | HR 74 | Temp 98.2°F | Ht 64.0 in | Wt 160.0 lb

## 2011-10-10 DIAGNOSIS — I10 Essential (primary) hypertension: Secondary | ICD-10-CM

## 2011-10-10 DIAGNOSIS — M159 Polyosteoarthritis, unspecified: Secondary | ICD-10-CM

## 2011-10-10 DIAGNOSIS — M810 Age-related osteoporosis without current pathological fracture: Secondary | ICD-10-CM

## 2011-10-10 DIAGNOSIS — K219 Gastro-esophageal reflux disease without esophagitis: Secondary | ICD-10-CM

## 2011-10-10 LAB — HEPATIC FUNCTION PANEL
ALT: 20 U/L (ref 0–35)
AST: 20 U/L (ref 0–37)
Total Protein: 7.5 g/dL (ref 6.0–8.3)

## 2011-10-10 LAB — BASIC METABOLIC PANEL
CO2: 27 mEq/L (ref 19–32)
Chloride: 106 mEq/L (ref 96–112)
Creatinine, Ser: 1 mg/dL (ref 0.4–1.2)
Potassium: 3.9 mEq/L (ref 3.5–5.1)
Sodium: 143 mEq/L (ref 135–145)

## 2011-10-10 LAB — CBC WITH DIFFERENTIAL/PLATELET
Basophils Relative: 0.5 % (ref 0.0–3.0)
Eosinophils Absolute: 0.3 10*3/uL (ref 0.0–0.7)
HCT: 44.2 % (ref 36.0–46.0)
Hemoglobin: 15.1 g/dL — ABNORMAL HIGH (ref 12.0–15.0)
Lymphs Abs: 1.9 10*3/uL (ref 0.7–4.0)
MCHC: 34 g/dL (ref 30.0–36.0)
MCV: 92.6 fl (ref 78.0–100.0)
Monocytes Absolute: 0.7 10*3/uL (ref 0.1–1.0)
Neutro Abs: 4.1 10*3/uL (ref 1.4–7.7)
RBC: 4.77 Mil/uL (ref 3.87–5.11)

## 2011-10-10 NOTE — Assessment & Plan Note (Signed)
Failed wean attempt Will continue daily

## 2011-10-10 NOTE — Assessment & Plan Note (Signed)
Does okay with ben gay and ibuprofen occ

## 2011-10-10 NOTE — Assessment & Plan Note (Signed)
discussed trying to walk more

## 2011-10-10 NOTE — Progress Notes (Signed)
Subjective:    Patient ID: Yvette Thomas, female    DOB: 10/11/1924, 78 y.o.   MRN: [id number]  HPI Doing fairly well No major new concerns  Still concerned about her memory Remains independent in ADLs, instrumental ADLs, etc Forgets names No concerns from family though  Stomach has been okay Tried to wean the omeprazole but it didn't work Still on daily med  Still does daily exercises Has her regimen she has done for 6 years---ROM in bed (arms and legs)----calisthenics Still has neck and back pain--bengay helps and uses ibuprofen prn (several times per week)  Allergies are acting up some chroinic sinus symptoms--uses OTC sinus pill helps and [place]  No chest pain No SOB---except occ sensation of SOB while in a dream No edema No dizziness or syncope  Current Outpatient Prescriptions on File Prior to Visit  Medication Sig Dispense Refill  . Ascorbic Acid (VITAMIN C) 500 MG tablet Take 500 mg by mouth daily.        . Calcium Carbonate-Vitamin D (CALCIUM 600+D) 600-400 MG-UNIT per tablet Take 1 tablet by mouth 2 (two) times daily.        . Cholecalciferol (VITAMIN D3) 400 UNITS CAPS Take 1 capsule by mouth daily.        . Multiple Vitamins-Minerals (CENTRUM SILVER PO) Take 1 tablet by mouth daily.        . Omega-3 Fatty Acids (FISH OIL) 1000 MG CAPS Take 1 capsule by mouth daily.        Marland Kitchen omeprazole (PRILOSEC) 20 MG capsule Take 20 mg by mouth daily.        Marland Kitchen triamterene-hydrochlorothiazide (MAXZIDE-25) 37.5-25 MG per tablet TAKE ONE TABLET BY MOUTH EVERY DAY  90 tablet  3  . Vitamins-Lipotropics (B-50 COMPLEX) TABS Take 1 tablet by mouth daily.          Allergies  Allergen Reactions  . Buspirone Hcl     REACTION: Tinnitis, dizzy  . Oxybutynin Chloride     REACTION: Dried up  . Tolterodine Tartrate     REACTION: Headache    Past Medical History  Diagnosis Date  . Colon polyps   . GERD (gastroesophageal reflux disease)   . Hyperlipidemia   . Hypertension   .  Hypothyroidism   . Osteoporosis   . Fibrocystic breast   . Urinary incontinence   . Allergic rhinitis, cause unspecified   . Osteoarthritis of multiple joints     Past Surgical History  Procedure Date  . Thyroidectomy, partial 1996    Family History  Problem Relation Age of Onset  . Hypertension Sister   . Melanoma Sister     History   Social History  . Marital Status: Widowed    Spouse Name: N/A    Number of Children: 2  . Years of Education: N/A   Occupational History  . RETIRED     [employer]   Social History Main Topics  . Smoking status: Former Smoker    Quit date: 07/02/1968  . Smokeless tobacco: Never Used  . Alcohol Use: No  . Drug Use: Not on file  . Sexually Active: Not on file   Other Topics Concern  . Not on file   Social History Narrative  . No narrative on file   Review of Systems Weight stable Eats okay Generally sleeps okay---irregular and often on couch at times. NO sig daytime somnolence    Objective:   Physical Exam  Constitutional: She appears well-developed and well-nourished. No distress.  Neck: Normal range of motion. Neck supple. No thyromegaly present.  Cardiovascular: Normal rate, regular rhythm, normal heart sounds and intact distal pulses.  Exam reveals no gallop.   No murmur heard. Pulmonary/Chest: Effort normal and breath sounds normal. No respiratory distress. She has no wheezes. She has no rales.  Abdominal: Soft. There is no tenderness.  Musculoskeletal: She exhibits no edema and no tenderness.  Lymphadenopathy:    She has no cervical adenopathy.  Psychiatric: She has a normal mood and affect. Her behavior is normal. Thought content normal.          Assessment & Plan:

## 2011-10-10 NOTE — Assessment & Plan Note (Signed)
BP Readings from Last 3 Encounters:  10/10/11 120/70  04/10/11 133/62  10/10/10 112/70   Good control No changes needed Due for labs

## 2012-01-15 ENCOUNTER — Encounter: Payer: Self-pay | Admitting: Gastroenterology

## 2012-01-21 ENCOUNTER — Other Ambulatory Visit: Payer: Self-pay | Admitting: Internal Medicine

## 2012-02-20 ENCOUNTER — Other Ambulatory Visit: Payer: Self-pay | Admitting: Internal Medicine

## 2012-05-06 ENCOUNTER — Ambulatory Visit (INDEPENDENT_AMBULATORY_CARE_PROVIDER_SITE_OTHER): Payer: Medicare Other | Admitting: Internal Medicine

## 2012-05-06 ENCOUNTER — Encounter: Payer: Self-pay | Admitting: Internal Medicine

## 2012-05-06 VITALS — BP 112/68 | HR 62 | Temp 98.2°F | Wt 159.0 lb

## 2012-05-06 DIAGNOSIS — M159 Polyosteoarthritis, unspecified: Secondary | ICD-10-CM

## 2012-05-06 DIAGNOSIS — J309 Allergic rhinitis, unspecified: Secondary | ICD-10-CM

## 2012-05-06 DIAGNOSIS — F411 Generalized anxiety disorder: Secondary | ICD-10-CM

## 2012-05-06 DIAGNOSIS — Z23 Encounter for immunization: Secondary | ICD-10-CM

## 2012-05-06 DIAGNOSIS — F39 Unspecified mood [affective] disorder: Secondary | ICD-10-CM | POA: Insufficient documentation

## 2012-05-06 DIAGNOSIS — K219 Gastro-esophageal reflux disease without esophagitis: Secondary | ICD-10-CM

## 2012-05-06 DIAGNOSIS — I10 Essential (primary) hypertension: Secondary | ICD-10-CM

## 2012-05-06 DIAGNOSIS — F419 Anxiety disorder, unspecified: Secondary | ICD-10-CM

## 2012-05-06 NOTE — Assessment & Plan Note (Signed)
Told her it was okay to continue OTC antihistamine nightly

## 2012-05-06 NOTE — Assessment & Plan Note (Signed)
Seems mostly situational with family stress Liked valium in past---discussed concerns with this Consider SSRI if worse

## 2012-05-06 NOTE — Assessment & Plan Note (Signed)
BP Readings from Last 3 Encounters:  05/06/12 112/68  10/10/11 120/70  04/10/11 133/62

## 2012-05-06 NOTE — Progress Notes (Signed)
Subjective:    Patient ID: Yvette Thomas, female    DOB: 12/23/1924, 78 y.o.   MRN: [id number]  HPI "I'm just falling apart"  Having increased reflux problems Has tried to reduce the omprazole---down to three times a week Really has had resurgence of heartburn and acid on lower dose  Back feels more stooped over Still on vitamin D Never Rx for her bones Some back pain---uses advil which helps pain but causes constipation Neck is better--had a hard time this summer  Now with "catch" in left elbow  Pain if she twists it No known injury Knee pain at times also (and charley horses at times in thighs)  Runny nose and sneezing Gets "spacy" in head with sinus headaches Uses OTC allergy pill with success (at bedtime)  Current Outpatient Prescriptions on File Prior to Visit  Medication Sig Dispense Refill  . Calcium Carbonate-Vitamin D (CALCIUM 600+D) 600-400 MG-UNIT per tablet Take 1 tablet by mouth 2 (two) times daily.        Marland Kitchen ibuprofen (ADVIL,MOTRIN) 200 MG tablet Take 200-400 mg by mouth 2 (two) times daily as needed. For arthritis pain      . Multiple Vitamins-Minerals (CENTRUM SILVER PO) Take 1 tablet by mouth daily.        . Omega-3 Fatty Acids (FISH OIL) 1000 MG CAPS Take 1 capsule by mouth daily.        Marland Kitchen triamterene-hydrochlorothiazide (MAXZIDE-25) 37.5-25 MG per tablet TAKE ONE TABLET BY MOUTH EVERY DAY  90 tablet  3  . Vitamins-Lipotropics (B-50 COMPLEX) TABS Take 1 tablet by mouth daily.          Allergies  Allergen Reactions  . Buspirone Hcl     REACTION: Tinnitis, dizzy  . Oxybutynin Chloride     REACTION: Dried up  . Tolterodine Tartrate     REACTION: Headache    Past Medical History  Diagnosis Date  . Colon polyps   . GERD (gastroesophageal reflux disease)   . Hyperlipidemia   . Hypertension   . Hypothyroidism   . Osteoporosis   . Fibrocystic breast   . Urinary incontinence   . Allergic rhinitis, cause unspecified   . Osteoarthritis of multiple  joints     Past Surgical History  Procedure Date  . Thyroidectomy, partial 1996    Family History  Problem Relation Age of Onset  . Hypertension Sister   . Melanoma Sister     History   Social History  . Marital Status: Widowed    Spouse Name: N/A    Number of Children: 2  . Years of Education: N/A   Occupational History  . RETIRED     [employer]   Social History Main Topics  . Smoking status: Former Smoker    Quit date: 07/02/1968  . Smokeless tobacco: Never Used  . Alcohol Use: No  . Drug Use: Not on file  . Sexually Active: Not on file   Other Topics Concern  . Not on file   Social History Narrative  . No narrative on file   Review of Systems Still with sleep problems. Falls asleep on couch then inconsistent with going to bed. Knows this is her pattern but not good about changing Appetite is fine Weight stable Stress with son and 2 children in [place]     Objective:   Physical Exam  Constitutional: She appears well-developed and well-nourished. No distress.  Neck: Normal range of motion. Neck supple. No thyromegaly present.  Cardiovascular: Normal rate, regular  rhythm and normal heart sounds.  Exam reveals no gallop.   No murmur heard. Pulmonary/Chest: Effort normal and breath sounds normal. No respiratory distress. She has no wheezes. She has no rales.  Abdominal: Soft. There is no tenderness.  Musculoskeletal: She exhibits no edema.       Normal ROM and no tenderness in left elbow  Lymphadenopathy:    She has no cervical adenopathy.  Psychiatric: She has a normal mood and affect. Her behavior is normal.       Stress with son but no overt depression          Assessment & Plan:

## 2012-05-06 NOTE — Assessment & Plan Note (Signed)
Seems to be reasonably controlled with ibuprofen Normal renal function  Lab Results  Component Value Date   CREATININE 1.0 10/10/2011   On PPI

## 2012-05-06 NOTE — Patient Instructions (Signed)
Please take the omeprazole every day again

## 2012-05-06 NOTE — Assessment & Plan Note (Signed)
Has failed trying to wean Will go back to daily on the omeprazole

## 2012-09-29 ENCOUNTER — Ambulatory Visit (INDEPENDENT_AMBULATORY_CARE_PROVIDER_SITE_OTHER): Payer: Medicare Other | Admitting: Internal Medicine

## 2012-09-29 ENCOUNTER — Encounter: Payer: Self-pay | Admitting: Internal Medicine

## 2012-09-29 VITALS — BP 156/84 | HR 67 | Temp 98.2°F | Wt 163.0 lb

## 2012-09-29 DIAGNOSIS — S46811A Strain of other muscles, fascia and tendons at shoulder and upper arm level, right arm, initial encounter: Secondary | ICD-10-CM

## 2012-09-29 DIAGNOSIS — S43499A Other sprain of unspecified shoulder joint, initial encounter: Secondary | ICD-10-CM

## 2012-09-29 MED ORDER — TRAMADOL HCL 50 MG PO TABS: 25.0000 mg | ORAL_TABLET | Freq: Three times a day (TID) | ORAL | Status: DC | PRN

## 2012-09-29 NOTE — Patient Instructions (Signed)
Please try chiropractic or massage therapy for your pain. Let me know if you want to try physical therapy. You can take the stronger pain medicine, tramadol, if the pain is bad. Start with a half tablet

## 2012-09-29 NOTE — Progress Notes (Signed)
Subjective:    Patient ID: Yvette Thomas, female    DOB: [birth year], 79 y.o.   MRN: [id number]  HPI "I'm falling apart" Has had a hard past year  Still concerned about her arthritis Lately, the ibuprofen hasn't been helping Right shoulder pain---seems to be pulling her over Trouble standing up straight Started about a month ago No known injury No swelling Uses arm okay but limited abduction (has to use other arm to lift over head) Tried sleeping with pillow under back to straighten her out---may have helped Ibuprofen not doing much anymore---even trying 2 at a time Heat is inconsistent helping  Current Outpatient Prescriptions on File Prior to Visit  Medication Sig Dispense Refill  . Calcium Carbonate-Vitamin D (CALCIUM 600+D) 600-400 MG-UNIT per tablet Take 1 tablet by mouth 2 (two) times daily.        Marland Kitchen ibuprofen (ADVIL,MOTRIN) 200 MG tablet Take 200-400 mg by mouth 2 (two) times daily as needed. For arthritis pain      . Multiple Vitamins-Minerals (CENTRUM SILVER PO) Take 1 tablet by mouth daily.        . Omega-3 Fatty Acids (FISH OIL) 1000 MG CAPS Take 1 capsule by mouth daily.        Marland Kitchen omeprazole (PRILOSEC) 20 MG capsule Take 20 mg by mouth daily.       Marland Kitchen triamterene-hydrochlorothiazide (MAXZIDE-25) 37.5-25 MG per tablet TAKE ONE TABLET BY MOUTH EVERY DAY  90 tablet  3  . Vitamins-Lipotropics (B-50 COMPLEX) TABS Take 1 tablet by mouth daily.         No current facility-administered medications on file prior to visit.    Allergies  Allergen Reactions  . Buspirone Hcl     REACTION: Tinnitis, dizzy  . Oxybutynin Chloride     REACTION: Dried up  . Tolterodine Tartrate     REACTION: Headache    Past Medical History  Diagnosis Date  . Colon polyps   . GERD (gastroesophageal reflux disease)   . Hyperlipidemia   . Hypertension   . Hypothyroidism   . Osteoporosis   . Fibrocystic breast   . Urinary incontinence   . Allergic rhinitis, cause unspecified   .  Osteoarthritis of multiple joints   . Anxiety     Past Surgical History  Procedure Laterality Date  . Thyroidectomy, partial  1996    Family History  Problem Relation Age of Onset  . Hypertension Sister   . Melanoma Sister     History   Social History  . Marital Status: Widowed    Spouse Name: N/A    Number of Children: 2  . Years of Education: N/A   Occupational History  . RETIRED     [employer]   Social History Main Topics  . Smoking status: Former Smoker    Quit date: 07/02/1968  . Smokeless tobacco: Never Used  . Alcohol Use: No  . Drug Use: Not on file  . Sexually Active: Not on file   Other Topics Concern  . Not on file   Social History Narrative  . No narrative on file   Review of Systems Still has issues with her nerves---trying to work on her estate---"more than I can handle" Appetite still good--still likes sweets Weight up 4#     Objective:   Physical Exam  Constitutional: She appears well-developed and well-nourished. No distress.  Musculoskeletal:  Mild kyphoscoliosis curved towards left Right shoulder is tilted down ROM of right shoulder is fairly good---just mild decreased abduction  Right trapezius is tight and slightly tender No localized shoulder tenderness          Assessment & Plan:

## 2012-09-29 NOTE — Assessment & Plan Note (Signed)
Seems to be related to her kyphoscoliosis She is considering chiropractic and I also suggested massage Tramadol if pain is especially bad Continue heat

## 2012-10-13 ENCOUNTER — Encounter: Payer: Self-pay | Admitting: Internal Medicine

## 2012-10-13 ENCOUNTER — Ambulatory Visit (INDEPENDENT_AMBULATORY_CARE_PROVIDER_SITE_OTHER): Payer: Medicare Other | Admitting: Internal Medicine

## 2012-10-13 VITALS — BP 150/80 | HR 69 | Temp 98.2°F | Ht 64.0 in | Wt 162.0 lb

## 2012-10-13 DIAGNOSIS — Z0000 Encounter for general adult medical examination without abnormal findings: Secondary | ICD-10-CM

## 2012-10-13 DIAGNOSIS — I10 Essential (primary) hypertension: Secondary | ICD-10-CM

## 2012-10-13 DIAGNOSIS — Z23 Encounter for immunization: Secondary | ICD-10-CM

## 2012-10-13 DIAGNOSIS — K219 Gastro-esophageal reflux disease without esophagitis: Secondary | ICD-10-CM

## 2012-10-13 DIAGNOSIS — S46811D Strain of other muscles, fascia and tendons at shoulder and upper arm level, right arm, subsequent encounter: Secondary | ICD-10-CM

## 2012-10-13 DIAGNOSIS — Z5189 Encounter for other specified aftercare: Secondary | ICD-10-CM

## 2012-10-13 LAB — BASIC METABOLIC PANEL
BUN: 16 mg/dL (ref 6–23)
Calcium: 9.7 mg/dL (ref 8.4–10.5)
GFR: 68.29 mL/min (ref >60.00)
Potassium: 3.8 mEq/L (ref 3.5–5.1)
Sodium: 139 mEq/L (ref 135–145)

## 2012-10-13 LAB — HEPATIC FUNCTION PANEL
ALT: 18 U/L (ref 0–35)
AST: 19 U/L (ref 0–37)
Alkaline Phosphatase: 51 U/L (ref 39–117)
Bilirubin, Direct: 0 mg/dL (ref 0.0–0.3)
Total Bilirubin: 0.5 mg/dL (ref 0.3–1.2)

## 2012-10-13 LAB — CBC WITH DIFFERENTIAL/PLATELET
Basophils Absolute: 0 10*3/uL (ref 0.0–0.1)
Eosinophils Relative: 3.3 % (ref 0.0–5.0)
HCT: 42.5 % (ref 36.0–46.0)
Hemoglobin: 14.6 g/dL (ref 12.0–15.0)
Lymphocytes Relative: 26.7 % (ref 12.0–46.0)
Lymphs Abs: 1.7 10*3/uL (ref 0.7–4.0)
Monocytes Relative: 7.9 % (ref 3.0–12.0)
Platelets: 191 10*3/uL (ref 150.0–400.0)
WBC: 6.4 10*3/uL (ref 4.5–10.5)

## 2012-10-13 NOTE — Progress Notes (Signed)
Subjective:    Patient ID: Yvette Thomas, female    DOB: 11/15/1924, 79 y.o.   MRN: [id number]  HPI Here for physical Her shoulder and neck did get a little better with chiropractic ---has gone 3 times Signed long term contract Pain goes into back at times Uses advil and now has the tramadol for prn  No other concerns No falls Tries to stay active-- goes out with friends. Independent with instrumental ADLs Current Outpatient Prescriptions on File Prior to Visit  Medication Sig Dispense Refill  . Calcium Carbonate-Vitamin D (CALCIUM 600+D) 600-400 MG-UNIT per tablet Take 1 tablet by mouth 2 (two) times daily.        Marland Kitchen ibuprofen (ADVIL,MOTRIN) 200 MG tablet Take 200-400 mg by mouth 2 (two) times daily as needed. For arthritis pain      . Multiple Vitamins-Minerals (CENTRUM SILVER PO) Take 1 tablet by mouth daily.        . Omega-3 Fatty Acids (FISH OIL) 1000 MG CAPS Take 1 capsule by mouth daily.        Marland Kitchen omeprazole (PRILOSEC) 20 MG capsule Take 20 mg by mouth daily.       . traMADol (ULTRAM) 50 MG tablet Take 0.5-1 tablets (25-50 mg total) by mouth 3 (three) times daily as needed for pain.  40 tablet  0  . triamterene-hydrochlorothiazide (MAXZIDE-25) 37.5-25 MG per tablet TAKE ONE TABLET BY MOUTH EVERY DAY  90 tablet  3  . Vitamins-Lipotropics (B-50 COMPLEX) TABS Take 1 tablet by mouth daily.         No current facility-administered medications on file prior to visit.    Allergies  Allergen Reactions  . Buspirone Hcl     REACTION: Tinnitis, dizzy  . Oxybutynin Chloride     REACTION: Dried up  . Tolterodine Tartrate     REACTION: Headache    Past Medical History  Diagnosis Date  . Colon polyps   . GERD (gastroesophageal reflux disease)   . Hyperlipidemia   . Hypertension   . Hypothyroidism   . Osteoporosis   . Fibrocystic breast   . Urinary incontinence   . Allergic rhinitis, cause unspecified   . Osteoarthritis of multiple joints   . Anxiety     Past Surgical  History  Procedure Laterality Date  . Thyroidectomy, partial  1996    Family History  Problem Relation Age of Onset  . Hypertension Sister   . Melanoma Sister   . Heart disease Mother   . Heart disease Father   . Hypertension Brother     History   Social History  . Marital Status: Widowed    Spouse Name: N/A    Number of Children: 2  . Years of Education: N/A   Occupational History  . RETIRED     [employer]   Social History Main Topics  . Smoking status: Former Smoker    Quit date: 07/02/1968  . Smokeless tobacco: Never Used  . Alcohol Use: No  . Drug Use: Not on file  . Sexually Active: Not on file   Other Topics Concern  . Not on file   Social History Narrative   Has living will    No formal health care POA but requests daughter.   Would accept resuscitation attempts but no prolonged artificial ventilation.   Would consider tube feedings   Review of Systems  Constitutional: Negative for fatigue and unexpected weight change.       Wears seat belt  HENT: Negative for  hearing loss, dental problem and tinnitus.        Regular with dentist  Eyes: Negative for visual disturbance.       No diplopia or unilateral vision loss Has had recurrent flashes of light  Respiratory: Negative for cough, chest tightness and shortness of breath.   Cardiovascular: Positive for palpitations. Negative for chest pain.       Rare skips in heart  Gastrointestinal: Positive for constipation. Negative for nausea, vomiting, abdominal pain and blood in stool.       Heartburn controlled on daily omeprazole---didn't tolerate further wean Some constipation with pain meds---better now  Endocrine: Negative for cold intolerance and heat intolerance.  Genitourinary: Negative for dysuria and difficulty urinating.       Has incontinence despite frequent voiding---wears protection  Musculoskeletal: Positive for back pain and arthralgias. Negative for joint swelling.  Skin: Negative  for rash.       No suspicious lesions  Allergic/Immunologic: Positive for environmental allergies. Negative for immunocompromised state.       Uses OTC sinus pills with good results  Neurological: Positive for headaches. Negative for dizziness, syncope, weakness, light-headedness and numbness.       Occasional headaches ---advil helps  Psychiatric/Behavioral: Positive for sleep disturbance. Negative for dysphoric mood. The patient is not nervous/anxious.        Sleeps irregular hours       Objective:   Physical Exam  Constitutional: She is oriented to person, place, and time. She appears well-developed and well-nourished. No distress.  HENT:  Head: Normocephalic.  Right Ear: External ear normal.  Left Ear: External ear normal.  Mouth/Throat: Oropharynx is clear and moist. No oropharyngeal exudate.  Eyes: Conjunctivae and EOM are normal. Pupils are equal, round, and reactive to light.  Neck: Normal range of motion. Neck supple. No thyromegaly present.  Cardiovascular: Normal rate, regular rhythm, normal heart sounds and intact distal pulses.  Exam reveals no gallop.   No murmur heard. Pulmonary/Chest: Effort normal and breath sounds normal. No respiratory distress. She has no wheezes. She has no rales.  Abdominal: Soft. There is no tenderness.  Musculoskeletal: She exhibits no edema and no tenderness.  Lymphadenopathy:    She has no cervical adenopathy.  Neurological: She is alert and oriented to person, place, and time.  Skin: No rash noted. No erythema.  Psychiatric: She has a normal mood and affect. Her behavior is normal.          Assessment & Plan:

## 2012-10-13 NOTE — Assessment & Plan Note (Signed)
BP Readings from Last 3 Encounters:  10/13/12 150/80  09/29/12 156/84  05/06/12 112/68   BP okay for her No change Due for labs

## 2012-10-13 NOTE — Assessment & Plan Note (Signed)
Okay with daily omeprazole 

## 2012-10-13 NOTE — Assessment & Plan Note (Signed)
Generally healthy Needs to work on [employer] or regular walking

## 2012-10-13 NOTE — Addendum Note (Signed)
Addended by: Sueanne Margarita on: 10/13/2012 12:32 PM   Modules accepted: Orders

## 2012-10-13 NOTE — Assessment & Plan Note (Signed)
Now getting chiropractic care Has meds as well

## 2012-10-14 ENCOUNTER — Encounter: Payer: Self-pay | Admitting: *Deleted

## 2012-12-15 ENCOUNTER — Telehealth: Payer: Self-pay

## 2012-12-15 ENCOUNTER — Ambulatory Visit (INDEPENDENT_AMBULATORY_CARE_PROVIDER_SITE_OTHER): Payer: Medicare Other | Admitting: Family Medicine

## 2012-12-15 ENCOUNTER — Encounter: Payer: Self-pay | Admitting: Family Medicine

## 2012-12-15 VITALS — BP 139/94 | HR 96 | Temp 97.8°F | Ht 64.0 in

## 2012-12-15 DIAGNOSIS — H8110 Benign paroxysmal vertigo, unspecified ear: Secondary | ICD-10-CM

## 2012-12-15 MED ORDER — MECLIZINE HCL 25 MG PO TABS: 25.0000 mg | ORAL_TABLET | Freq: Three times a day (TID) | ORAL | Status: DC | PRN

## 2012-12-15 NOTE — Telephone Encounter (Signed)
Pt already has appt today to see Dr Patsy Lager at 12:30 pm.; pt said 12/14/12 had dizziness when stood up and head felt tight. No CP, SOB, or H/A. Today BP 198/89 and dizzy only when stands up fast. Advised pt to get up slowly and keep appt already scheduled; if condition changes or worsens pt will call back.

## 2012-12-15 NOTE — Progress Notes (Signed)
Nature conservation officer at [facility] [address] [place] [address] Phone: [phone] Fax: [phone]  Date:  12/15/2012   Name:  Yvette Thomas   DOB:  07/21/1924   MRN:  [id number] Gender: female Age: 79 y.o.  Primary Physician:  Tillman Abide, MD  Evaluating MD: Hannah Beat, MD   Chief Complaint: Dizziness   History of Present Illness:  Yvette Thomas is a 79 y.o. pleasant patient who presents with the following:  Acute vertigo: Woke up yesterday morning, and was getting some spinning. Got back in the bed and the next time, felt like spinning again. BP was 200/90 at that time. Was checking her BP off and on. When getting up from sitting, had spinning.   No recent illness.  No history of vertigo, daughter has intermittent vertigo. No syncope or presyncope.   Patient Active Problem List   Diagnosis Date Noted  . Routine general medical examination at a health care facility 10/13/2012  . Strain of right trapezius muscle 09/29/2012  . Anxiety   . Allergic rhinitis, cause unspecified   . Osteoarthritis of multiple joints   . URINARY INCONTINENCE 04/02/2007  . HYPERTENSION 03/17/2007  . GERD 03/17/2007  . FIBROCYSTIC BREAST DISEASE 03/17/2007  . OSTEOPOROSIS 03/17/2007  . COLONIC POLYPS, HX OF 03/17/2007    Past Medical History  Diagnosis Date  . Colon polyps   . GERD (gastroesophageal reflux disease)   . Hyperlipidemia   . Hypertension   . Hypothyroidism   . Osteoporosis   . Fibrocystic breast   . Urinary incontinence   . Allergic rhinitis, cause unspecified   . Osteoarthritis of multiple joints   . Anxiety     Past Surgical History  Procedure Laterality Date  . Thyroidectomy, partial  1996    History   Social History  . Marital Status: Widowed    Spouse Name: N/A    Number of Children: 2  . Years of Education: N/A   Occupational History  . RETIRED     [employer]   Social History Main Topics  . Smoking status: Former  Smoker    Quit date: 07/02/1968  . Smokeless tobacco: Never Used  . Alcohol Use: No  . Drug Use: Not on file  . Sexually Active: Not on file   Other Topics Concern  . Not on file   Social History Narrative   Has living will    No formal health care POA but requests daughter.   Would accept resuscitation attempts but no prolonged artificial ventilation.   Would consider tube feedings    Family History  Problem Relation Age of Onset  . Hypertension Sister   . Melanoma Sister   . Heart disease Mother   . Heart disease Father   . Hypertension Brother     Allergies  Allergen Reactions  . Buspirone Hcl     REACTION: Tinnitis, dizzy  . Oxybutynin Chloride     REACTION: Dried up  . Tolterodine Tartrate     REACTION: Headache    Medication list has been reviewed and updated.  Outpatient Prescriptions Prior to Visit  Medication Sig Dispense Refill  . Calcium Carbonate-Vitamin D (CALCIUM 600+D) 600-400 MG-UNIT per tablet Take 1 tablet by mouth 2 (two) times daily.        Marland Kitchen ibuprofen (ADVIL,MOTRIN) 200 MG tablet Take 200-400 mg by mouth 2 (two) times daily as needed. For arthritis pain      . Multiple Vitamins-Minerals (CENTRUM SILVER PO) Take  1 tablet by mouth daily.        . Omega-3 Fatty Acids (FISH OIL) 1000 MG CAPS Take 1 capsule by mouth daily.        Marland Kitchen omeprazole (PRILOSEC) 20 MG capsule Take 20 mg by mouth daily.       . traMADol (ULTRAM) 50 MG tablet Take 0.5-1 tablets (25-50 mg total) by mouth 3 (three) times daily as needed for pain.  40 tablet  0  . triamterene-hydrochlorothiazide (MAXZIDE-25) 37.5-25 MG per tablet TAKE ONE TABLET BY MOUTH EVERY DAY  90 tablet  3  . Vitamins-Lipotropics (B-50 COMPLEX) TABS Take 1 tablet by mouth daily.         No facility-administered medications prior to visit.    Review of Systems:   GEN: No acute illnesses, no fevers, chills. GI: No n/v/d, eating normally Pulm: No SOB Interactive and getting along well at  home.  Otherwise, ROS is as per the HPI.   Physical Examination: BP 139/94  Pulse 96  Temp(Src) 97.8 F (36.6 C)  Ht 5\' 4"  (1.626 m)  SpO2 97%  Ideal Body Weight: Weight in (lb) to have BMI = 25: 145.3  Filed Vitals:   12/15/12 1240 12/15/12 1242 12/15/12 1245 12/15/12 1246  BP:  141/79 157/86 139/94  Pulse:  73 86 96  Temp: 97.8 F (36.6 C)     Height: 5\' 4"  (1.626 m)     SpO2: 97%        GEN: WDWN, NAD, Non-toxic, A & O x 3 HEENT: Atraumatic, Normocephalic. Neck supple. No masses, No LAD. Ears and Nose: No external deformity. TM clear. Vertigo induced with rotation of head. No nystagmus. Dramatic vertigo from sitting up after lying down.  CV: RRR, No M/G/R. No JVD. No thrill. No extra heart sounds. PULM: CTA B, no wheezes, crackles, rhonchi. No retractions. No resp. distress. No accessory muscle use. EXTR: No c/c/e NEURO Normal gait.  PSYCH: Normally interactive. Conversant. Not depressed or anxious appearing.  Calm demeanor.    Assessment and Plan:  Benign paroxysmal positional vertigo  Classic history and inducible in the office. No driving until improved Meclizine If not improved in a few weeks, then vestibular rehab an option vs. Potential other causes  Orders Today:  No orders of the defined types were placed in this encounter.    Updated Medication List: (Includes new medications, updates to list, dose adjustments) Meds ordered this encounter  Medications  . meclizine (ANTIVERT) 25 MG tablet    Sig: Take 1 tablet (25 mg total) by mouth 3 (three) times daily as needed for dizziness.    Dispense:  30 tablet    Refill:  1    Medications Discontinued: There are no discontinued medications.    Signed, Elpidio Galea. Angalina Ante, MD 12/15/2012 12:55 PM

## 2012-12-22 ENCOUNTER — Telehealth: Payer: Self-pay

## 2012-12-22 NOTE — Telephone Encounter (Signed)
Pt seen 12/15/12 for dizziness; pt taking meclizine 25 mg 1/2 tab three times a day; dizziness is better but still dizzy when stands; advised to get up from sitting or laying position slowly. Pt started with sinus pressure and allergies; head feels full and tight. No fever,cough, S/T or earache.Pt wants to know if can take OTC med she has, Diphen Hy-dramamine 25 mg for sinus and allergies. Pt also wants Dr Karle Starch opinion; Dr Patsy Lager advised pt not to see chiropractor for 1 month due to moving head around; pt wants to return to chiropractor asap for back issue. Please advise. [facility] [address]

## 2012-12-22 NOTE — Telephone Encounter (Signed)
I would recommend cetirizine 10mg  daily or loratadine 10mg  1-2 daily for allergies. The diphenyhydramine may cause a lot of sedation when used with meclizine  I think it would be better to wait on the chirpractor until the vertigo is mostly or all gone

## 2012-12-23 NOTE — Telephone Encounter (Signed)
Spoke with patient and advised results, [place] the [place] and per pt she will try one of these

## 2013-01-12 ENCOUNTER — Ambulatory Visit: Payer: Medicare Other | Admitting: Internal Medicine

## 2013-01-12 ENCOUNTER — Telehealth: Payer: Self-pay

## 2013-01-12 NOTE — Telephone Encounter (Signed)
Pt rescheduled appt for tomorrow 01/13/2013

## 2013-01-12 NOTE — Telephone Encounter (Signed)
Probably best to keep the appointment to check BP here and to be sure there are no worrisome neurologic signs given that the vertigo is still here a month later

## 2013-01-12 NOTE — Telephone Encounter (Signed)
Pt continues with vertigo and pt wants to come in for appt. Pt scheduled to see Dr Alphonsus Sias today at 2:45 pm. Pt wanted to ask Dr Alphonsus Sias if pt could continue with Meclizine and if so she will need refill to [facility] [address]. Pt wants to know if could start going back to chiropractor and massage therapist even thou still having dizziness.Pt's blood pressure at home averages 170/80s in the morning; after rest BP averages 130/78. If Dr Alphonsus Sias can answer questions and thinks BP is OK pt will cancel appt today Please advise. Pt request call back.

## 2013-01-13 ENCOUNTER — Encounter: Payer: Self-pay | Admitting: Internal Medicine

## 2013-01-13 ENCOUNTER — Ambulatory Visit (INDEPENDENT_AMBULATORY_CARE_PROVIDER_SITE_OTHER): Payer: Medicare Other | Admitting: Internal Medicine

## 2013-01-13 VITALS — BP 140/70 | HR 72 | Temp 97.5°F | Wt 159.0 lb

## 2013-01-13 DIAGNOSIS — R42 Dizziness and giddiness: Secondary | ICD-10-CM

## 2013-01-13 MED ORDER — MECLIZINE HCL 25 MG PO TABS: 12.5000 mg | ORAL_TABLET | Freq: Three times a day (TID) | ORAL | Status: DC | PRN

## 2013-01-13 NOTE — Assessment & Plan Note (Signed)
Persistent but no worrisome features Will continue the meclizine for now If persists, would consider ENT eval  Okay to try chiropractor for her usual symptoms if needed

## 2013-01-13 NOTE — Patient Instructions (Signed)
If your vertigo doesn't improve in the next month or so, please call and I can refer you to an ENT for further evaluation. Okay to go back to the chiropractor if needed.

## 2013-01-13 NOTE — Progress Notes (Signed)
Subjective:    Patient ID: Yvette Thomas, female    DOB: 05-04-1925, 79 y.o.   MRN: [id number]  HPI Still having dizziness Stopped driving for safety over the past 2 weeks or more  When she gets up in AM, getting out of bed starts the symptoms Rocking sensation back and forth Trouble with balance Occurs if gets up from low chair Notices it if bends head forward in chair at hair dresser Nausea only the very first time. No vomiting  Has tinnitus Hearing is okay  Using meclizine 1/2 tab tid Has continued this --it does seem to help some  Current Outpatient Prescriptions on File Prior to Visit  Medication Sig Dispense Refill  . Calcium Carbonate-Vitamin D (CALCIUM 600+D) 600-400 MG-UNIT per tablet Take 1 tablet by mouth 2 (two) times daily.        Marland Kitchen ibuprofen (ADVIL,MOTRIN) 200 MG tablet Take 200-400 mg by mouth 2 (two) times daily as needed. For arthritis pain      . Multiple Vitamins-Minerals (CENTRUM SILVER PO) Take 1 tablet by mouth daily.        . Omega-3 Fatty Acids (FISH OIL) 1000 MG CAPS Take 1 capsule by mouth daily.        Marland Kitchen omeprazole (PRILOSEC) 20 MG capsule Take 20 mg by mouth daily.       . traMADol (ULTRAM) 50 MG tablet Take 0.5-1 tablets (25-50 mg total) by mouth 3 (three) times daily as needed for pain.  40 tablet  0  . triamterene-hydrochlorothiazide (MAXZIDE-25) 37.5-25 MG per tablet TAKE ONE TABLET BY MOUTH EVERY DAY  90 tablet  3  . Vitamins-Lipotropics (B-50 COMPLEX) TABS Take 1 tablet by mouth daily.         No current facility-administered medications on file prior to visit.    Allergies  Allergen Reactions  . Buspirone Hcl     REACTION: Tinnitis, dizzy  . Oxybutynin Chloride     REACTION: Dried up  . Tolterodine Tartrate     REACTION: Headache    Past Medical History  Diagnosis Date  . Colon polyps   . GERD (gastroesophageal reflux disease)   . Hyperlipidemia   . Hypertension   . Hypothyroidism   . Osteoporosis   . Fibrocystic breast   .  Urinary incontinence   . Allergic rhinitis, cause unspecified   . Osteoarthritis of multiple joints   . Anxiety     Past Surgical History  Procedure Laterality Date  . Thyroidectomy, partial  1996    Family History  Problem Relation Age of Onset  . Hypertension Sister   . Melanoma Sister   . Heart disease Mother   . Heart disease Father   . Hypertension Brother     History   Social History  . Marital Status: Widowed    Spouse Name: N/A    Number of Children: 2  . Years of Education: N/A   Occupational History  . RETIRED     [employer] TEACHER   Social History Main Topics  . Smoking status: Former Smoker    Quit date: 07/02/1968  . Smokeless tobacco: Never Used  . Alcohol Use: No  . Drug Use: Not on file  . Sexually Active: Not on file   Other Topics Concern  . Not on file   Social History Narrative   Has living will    No formal health care POA but requests daughter.   Would accept resuscitation attempts but no prolonged artificial ventilation.   Would consider  tube feedings   Review of Systems No headaches---but has sense of pressure Some sinus fullness No weakness in extremities No speech or swallowing difficulty Notices memory issues---not really changed in past month (but more nocticeable in the past year)    Objective:   Physical Exam  Constitutional: She appears well-developed and well-nourished. No distress.  HENT:  Mouth/Throat: Oropharynx is clear and moist. No oropharyngeal exudate.  TMs fine  Eyes: Conjunctivae and EOM are normal. Pupils are equal, round, and reactive to light.  No nystagmus  Neurological: She is alert. She has normal strength. She displays no tremor. No cranial nerve deficit. She exhibits normal muscle tone. She displays a negative Romberg sign. Coordination and gait normal.          Assessment & Plan:

## 2013-01-24 ENCOUNTER — Other Ambulatory Visit: Payer: Self-pay | Admitting: Internal Medicine

## 2013-04-03 ENCOUNTER — Other Ambulatory Visit: Payer: Self-pay | Admitting: Internal Medicine

## 2013-04-14 ENCOUNTER — Ambulatory Visit: Payer: Medicare Other | Admitting: Internal Medicine

## 2013-04-15 ENCOUNTER — Ambulatory Visit (INDEPENDENT_AMBULATORY_CARE_PROVIDER_SITE_OTHER): Payer: Medicare Other | Admitting: Internal Medicine

## 2013-04-15 ENCOUNTER — Encounter: Payer: Self-pay | Admitting: Internal Medicine

## 2013-04-15 VITALS — BP 128/70 | HR 68 | Temp 98.2°F | Wt 157.0 lb

## 2013-04-15 DIAGNOSIS — M159 Polyosteoarthritis, unspecified: Secondary | ICD-10-CM

## 2013-04-15 DIAGNOSIS — I10 Essential (primary) hypertension: Secondary | ICD-10-CM

## 2013-04-15 DIAGNOSIS — R42 Dizziness and giddiness: Secondary | ICD-10-CM

## 2013-04-15 DIAGNOSIS — K219 Gastro-esophageal reflux disease without esophagitis: Secondary | ICD-10-CM

## 2013-04-15 DIAGNOSIS — Z23 Encounter for immunization: Secondary | ICD-10-CM

## 2013-04-15 NOTE — Assessment & Plan Note (Signed)
Worst area is neck Some help with chiropractic

## 2013-04-15 NOTE — Assessment & Plan Note (Signed)
Mild symptoms Continues on the PPI

## 2013-04-15 NOTE — Addendum Note (Signed)
Addended by: Sueanne Margarita on: 04/15/2013 03:54 PM   Modules accepted: Orders

## 2013-04-15 NOTE — Assessment & Plan Note (Signed)
Better now Has the meclizine for if it recurs

## 2013-04-15 NOTE — Assessment & Plan Note (Signed)
BP Readings from Last 3 Encounters:  04/15/13 128/70  01/13/13 140/70  12/15/12 139/94   Good control Lab Results  Component Value Date   CREATININE 0.8 10/13/2012

## 2013-04-15 NOTE — Progress Notes (Signed)
Subjective:    Patient ID: Yvette Thomas, female    DOB: 03/05/1925, 79 y.o.   MRN: [id number]  HPI Doing well  Vertigo is better Seems to have stopped completely No longer needing the meclizine  Continues with the chiropractor Notes are reviewed Still has problems with neck flexion and trouble holding her head and shoulders up No major pain issues advil helps---but causes constipation (apple juice helps this)  No chest pain (other than heartburn--heaviness when she lies down) No overt heartburn No swallowing problems  No SOB Rare headaches--relates to allergy and sinus Some edema--- not bad  Current Outpatient Prescriptions on File Prior to Visit  Medication Sig Dispense Refill  . Calcium Carbonate-Vitamin D (CALCIUM 600+D) 600-400 MG-UNIT per tablet Take 1 tablet by mouth 2 (two) times daily.        Marland Kitchen ibuprofen (ADVIL,MOTRIN) 200 MG tablet Take 200-400 mg by mouth 2 (two) times daily as needed. For arthritis pain      . Multiple Vitamins-Minerals (CENTRUM SILVER PO) Take 1 tablet by mouth daily.        . Omega-3 Fatty Acids (FISH OIL) 1000 MG CAPS Take 1 capsule by mouth daily.        Marland Kitchen triamterene-hydrochlorothiazide (MAXZIDE-25) 37.5-25 MG per tablet TAKE ONE TABLET BY MOUTH EVERY DAY  90 tablet  3  . Vitamins-Lipotropics (B-50 COMPLEX) TABS Take 1 tablet by mouth daily.         No current facility-administered medications on file prior to visit.    Allergies  Allergen Reactions  . Buspirone Hcl     REACTION: Tinnitis, dizzy  . Oxybutynin Chloride     REACTION: Dried up  . Tolterodine Tartrate     REACTION: Headache    Past Medical History  Diagnosis Date  . Colon polyps   . GERD (gastroesophageal reflux disease)   . Hyperlipidemia   . Hypertension   . Hypothyroidism   . Osteoporosis   . Fibrocystic breast   . Urinary incontinence   . Allergic rhinitis, cause unspecified   . Osteoarthritis of multiple joints   . Anxiety     Past Surgical History   Procedure Laterality Date  . Thyroidectomy, partial  1996    Family History  Problem Relation Age of Onset  . Hypertension Sister   . Melanoma Sister   . Heart disease Mother   . Heart disease Father   . Hypertension Brother     History   Social History  . Marital Status: Widowed    Spouse Name: N/A    Number of Children: 2  . Years of Education: N/A   Occupational History  . RETIRED     [employer] TEACHER   Social History Main Topics  . Smoking status: Former Smoker    Quit date: 07/02/1968  . Smokeless tobacco: Never Used  . Alcohol Use: No  . Drug Use: Not on file  . Sexual Activity: Not on file   Other Topics Concern  . Not on file   Social History Narrative   Has living will    No formal health care POA but requests daughter.   Would accept resuscitation attempts but no prolonged artificial ventilation.   Would consider tube feedings   Review of Systems Appetite is fine Weight is down 2# Sleeps fairly well --off and on for 7-8 hours a day    Objective:   Physical Exam  Constitutional: She appears well-developed and well-nourished. No distress.  Neck: Normal range of motion. Neck  supple. No thyromegaly present.  Cardiovascular: Normal rate, regular rhythm, normal heart sounds and intact distal pulses.  Exam reveals no gallop.   No murmur heard. Pulmonary/Chest: Effort normal and breath sounds normal. No respiratory distress. She has no wheezes. She has no rales.  Musculoskeletal: She exhibits no edema and no tenderness.  Lymphadenopathy:    She has no cervical adenopathy.  Psychiatric: She has a normal mood and affect. Her behavior is normal.          Assessment & Plan:

## 2013-10-01 ENCOUNTER — Telehealth: Payer: Self-pay | Admitting: Internal Medicine

## 2013-10-01 ENCOUNTER — Emergency Department: Payer: Self-pay | Admitting: Emergency Medicine

## 2013-10-01 LAB — BASIC METABOLIC PANEL
Anion Gap: 5 — ABNORMAL LOW (ref 7–16)
BUN: 18 mg/dL (ref 7–18)
Calcium, Total: 9.2 mg/dL (ref 8.5–10.1)
Chloride: 109 mmol/L — ABNORMAL HIGH (ref 98–107)
Co2: 28 mmol/L (ref 21–32)
Creatinine: 1.01 mg/dL (ref 0.60–1.30)
EGFR (African American): 58 — ABNORMAL LOW
EGFR (Non-African Amer.): 50 — ABNORMAL LOW
Glucose: 83 mg/dL (ref 65–99)
Osmolality: 284 (ref 275–301)
POTASSIUM: 3.8 mmol/L (ref 3.5–5.1)
Sodium: 142 mmol/L (ref 136–145)

## 2013-10-01 LAB — CBC WITH DIFFERENTIAL/PLATELET
BASOS ABS: 0.1 10*3/uL (ref 0.0–0.1)
Basophil %: 1.1 %
EOS ABS: 0.3 10*3/uL (ref 0.0–0.7)
Eosinophil %: 4.1 %
HCT: 41.9 % (ref 35.0–47.0)
HGB: 14 g/dL (ref 12.0–16.0)
LYMPHS ABS: 1.4 10*3/uL (ref 1.0–3.6)
Lymphocyte %: 23.2 %
MCH: 30.8 pg (ref 26.0–34.0)
MCHC: 33.4 g/dL (ref 32.0–36.0)
MCV: 92 fL (ref 80–100)
MONO ABS: 0.4 x10 3/mm (ref 0.2–0.9)
Monocyte %: 7.1 %
NEUTROS ABS: 4 10*3/uL (ref 1.4–6.5)
Neutrophil %: 64.5 %
Platelet: 191 10*3/uL (ref 150–440)
RBC: 4.54 10*6/uL (ref 3.80–5.20)
RDW: 13.5 % (ref 11.5–14.5)
WBC: 6.2 10*3/uL (ref 3.6–11.0)

## 2013-10-01 LAB — URINALYSIS, COMPLETE
Bilirubin,UR: NEGATIVE
Blood: NEGATIVE
Glucose,UR: NEGATIVE mg/dL (ref 0–75)
KETONE: NEGATIVE
Leukocyte Esterase: NEGATIVE
Nitrite: NEGATIVE
Ph: 6 (ref 4.5–8.0)
Protein: NEGATIVE
RBC,UR: 2 /HPF (ref 0–5)
Specific Gravity: 1.014 (ref 1.003–1.030)
Squamous Epithelial: 1

## 2013-10-01 LAB — TROPONIN I
Troponin-I: <0.02 ng/mL
Troponin-I: <0.02 ng/mL

## 2013-10-01 NOTE — Telephone Encounter (Signed)
This note states that they "discussed delay with RN team lead who advised to send her to ED now".  I never spoke to anyone with [facility] or anyone regarding this patient.  Rena, the triage nurse did not either.

## 2013-10-01 NOTE — Telephone Encounter (Signed)
[facility] are [facility] Discussed with daughter Has had some problems for a couple of weeks Doesn't sound like ER is necessary  Will see what they say at urgent care  Please check on her on Monday Should probably have an appt here in follow up

## 2013-10-01 NOTE — Telephone Encounter (Signed)
Patient Information:  Caller Name: Yvette Thomas  Phone: [phone]  Patient: Yvette Thomas, Yvette Thomas  Gender: Female  DOB: Nov 24, 1924  Age: 80 Years  PCP: Tillman AbideLetvak , [facility] Practice)  Office Follow Up:  Does the office need to follow up with this patient?: No  Instructions For The Office: N/A  RN Note:  Symptomatic hypertension.  History of chronic, intermittent  vertigo and unsteady walking. Fell at home in garage 3 weeks ago and vertigo stopped for 1 week.  Evaluated by chiropractor with xrays.  No appointments remain for [facility] [place] [facility] for 10/01/13.  RN waited on prolonged hold to reach office staff for posible work in appointment  Discussed delay with RN team lead who advised to send her to ED now.  Instructed patient to have someone drive her to [facility].  Plans to go to [facility] since it is closer as soon as she can get someone to drive her.  Symptoms  Reason For Call & Symptoms: Emergent Call:  Vertigo and HTN.   Felt very "swimmy headed" when got up from sink after hair wash at beauty parlor.  Withheld BP meds this morning due to late to hair apointment and had not eaten.  BP left arm 204/105.  Reviewed Health History In EMR: Yes  Reviewed Medications In EMR: Yes  Reviewed Allergies In EMR: Yes  Reviewed Surgeries / Procedures: Yes  Date of Onset of Symptoms: 10/01/2013  Guideline(s) Used:  High Blood Pressure  Disposition Per Guideline:   Go to ED Now (or to Office with PCP Approval)  Reason For Disposition Reached:   BP > 160 / 100 and any cardiac or neurologic symptoms (e.g., chest pain, difficulty breathing, unsteady gait, blurred vision)  Advice Given:  N/A  RN Overrode Recommendation:  Go To ED  Unable to reach office scheduler for possible work in appointment.

## 2013-10-02 ENCOUNTER — Encounter: Payer: Self-pay | Admitting: Cardiovascular Disease

## 2013-10-02 ENCOUNTER — Ambulatory Visit (INDEPENDENT_AMBULATORY_CARE_PROVIDER_SITE_OTHER): Payer: Medicare PPO | Admitting: Cardiovascular Disease

## 2013-10-02 VITALS — BP 162/86 | HR 63 | Ht 60.0 in | Wt 163.0 lb

## 2013-10-02 DIAGNOSIS — I10 Essential (primary) hypertension: Secondary | ICD-10-CM

## 2013-10-02 DIAGNOSIS — R42 Dizziness and giddiness: Secondary | ICD-10-CM

## 2013-10-02 DIAGNOSIS — R0602 Shortness of breath: Secondary | ICD-10-CM

## 2013-10-02 DIAGNOSIS — R0789 Other chest pain: Secondary | ICD-10-CM

## 2013-10-02 DIAGNOSIS — N390 Urinary tract infection, site not specified: Secondary | ICD-10-CM

## 2013-10-02 DIAGNOSIS — E785 Hyperlipidemia, unspecified: Secondary | ICD-10-CM | POA: Insufficient documentation

## 2013-10-02 DIAGNOSIS — R9431 Abnormal electrocardiogram [ECG] [EKG]: Secondary | ICD-10-CM

## 2013-10-02 MED ORDER — SULFAMETHOXAZOLE-TMP DS 800-160 MG PO TABS: 1.0000 | ORAL_TABLET | Freq: Two times a day (BID) | ORAL | Status: DC

## 2013-10-02 MED ORDER — AMLODIPINE BESY-BENAZEPRIL HCL 5-40 MG PO CAPS: 1.0000 | ORAL_CAPSULE | Freq: Every day | ORAL | Status: DC

## 2013-10-02 NOTE — Assessment & Plan Note (Signed)
She seems to be having frequent episodes of benign positional vertigo. Recommended she continue her meclizine, consider evaluation by ear nose throat. She also reports having tinnitus.

## 2013-10-02 NOTE — Patient Instructions (Addendum)
Stop triamterene HCTZ Start amlodipine benazepril one a day Monitor your blood pressure at home Goal blood pressure 130 to 140   See if Dr. Alphonsus SiasLetvak can check cholesterol RED YEAST RICE for cholesterol (1 to 4 a day), Oatmeal/fiber for cholesterol  google "splash" for emergency button  Please take bactrim one pill twice a day for UTI  Please call us if you have new issues that need to be addressed before your next appt.

## 2013-10-02 NOTE — Assessment & Plan Note (Signed)
She reports that most of her other brothers and sisters are on cholesterol pill. Cholesterol in 2010 was 260. Order for lipid panel placed. Suggested she try red yeast rice, oatmeal, fiber, weight loss

## 2013-10-02 NOTE — Assessment & Plan Note (Signed)
Uncertain if Escherichia coli positive urine culture done yesterday is contributing to any symptoms. She is requesting that we treat this. We will start Bactrim DS twice a day for 5 days. Suggested she followup with Dr. Alphonsus SiasLetvak. Sensitivities are still pending

## 2013-10-02 NOTE — Assessment & Plan Note (Signed)
EKG showing right bundle branch block. EKG findings in the emergency room also consistent with right bundle branch block. She is asymptomatic, clinical exam otherwise essentially benign. She's not having symptoms, no further testing at this time

## 2013-10-02 NOTE — Progress Notes (Signed)
Patient ID: Yvette Thomas, female    DOB: Jun 26, 1925, 80 y.o.   MRN: [id number]  HPI Comments: Ms. Yvette Thomas is a pleasant 80 year old woman with history of hypertension, hyperlipidemia, remote smoking history for at least 20 years, urine incontinence who presents after being seen in the emergency room yesterday 10/01/2013 for abnormal EKG, malaise.  She reports that over the past 2 days she has had significant vertigo symptoms. She has been taking her meclizine. Blood pressure measurements at home have measured often greater than 200 systolic, over 100 diastolic. She went to the urgent care with blood pressure 170 to 180 systolic. She was sent to the emergency room .  In the emergency room she was noted to have normal sinus rhythm with right bundle branch block, T-wave abnormality in the anterolateral and inferior leads, UA was slightly positive with  urine culture growing Escherichia coli 80K cfu/ml. CBC was normal, basic metabolic panel essentially normal, negative cardiac enzymes.  CT scan of the head showed mild cerebral atrophy, chronic small vessel ischemic changes   She reports that in general she feels well apart from incontinence and her vertigo. She reports having accidents with her urine. This has been ongoing for quite some time . She is concerned about her high blood pressure. She has not been checking her blood pressure at home on a regular basis . She denies any leg edema, no near syncope or syncope . Typically meclizine helps her vertigo but she becomes very sleepy   EKG today shows normal sinus rhythm with rate 63 beats per minute, right bundle branch block    Outpatient Encounter Prescriptions as of 10/02/2013  Medication Sig  . Calcium Carbonate-Vitamin D (CALCIUM 600+D) 600-400 MG-UNIT per tablet Take 1 tablet by mouth 2 (two) times daily.    Marland Kitchen ibuprofen (ADVIL,MOTRIN) 200 MG tablet Take 200-400 mg by mouth 2 (two) times daily as needed. For arthritis pain  . Multiple  Vitamins-Minerals (CENTRUM SILVER PO) Take 1 tablet by mouth daily.    . Omega-3 Fatty Acids (FISH OIL) 1000 MG CAPS Take 1 capsule by mouth daily.    Marland Kitchen omeprazole (PRILOSEC) 20 MG capsule Take 20 mg by mouth daily.  . vitamin E 400 UNIT capsule Take 800 Units by mouth daily.  . Vitamins-Lipotropics (B-50 COMPLEX) TABS Take 1 tablet by mouth daily.    Marland Kitchen  triamterene-hydrochlorothiazide (MAXZIDE-25) 37.5-25 MG per tablet TAKE ONE TABLET BY MOUTH EVERY DAY  . [DISCONTINUED] Magnesium 100 MG CAPS Take 1 capsule by mouth daily.     Review of Systems  Constitutional: Negative.   HENT: Negative.   Eyes: Negative.   Respiratory: Negative.   Cardiovascular: Negative.   Gastrointestinal: Negative.   Endocrine: Negative.   Musculoskeletal: Negative.   Skin: Negative.   Allergic/Immunologic: Negative.   Neurological:       Vertigo  Hematological: Negative.   Psychiatric/Behavioral: Negative.   All other systems reviewed and are negative.    BP 162/86  Pulse 63  Ht 5' (1.524 m)  Wt 163 lb (73.936 kg)  BMI 31.83 kg/m2  Physical Exam  Nursing note and vitals reviewed. Constitutional: She is oriented to person, place, and time. She appears well-developed and well-nourished.  HENT:  Head: Normocephalic.  Nose: Nose normal.  Mouth/Throat: Oropharynx is clear and moist.  Eyes: Conjunctivae are normal. Pupils are equal, round, and reactive to light.  Neck: Normal range of motion. Neck supple. No JVD present.  Cardiovascular: Normal rate, regular rhythm, S1 normal, S2 normal,  normal heart sounds and intact distal pulses.  Exam reveals no gallop and no friction rub.   No murmur heard. Pulmonary/Chest: Effort normal and breath sounds normal. No respiratory distress. She has no wheezes. She has no rales. She exhibits no tenderness.  Abdominal: Soft. Bowel sounds are normal. She exhibits no distension. There is no tenderness.  Musculoskeletal: Normal range of motion. She exhibits no edema and  no tenderness.  Lymphadenopathy:    She has no cervical adenopathy.  Neurological: She is alert and oriented to person, place, and time. Coordination normal.  Skin: Skin is warm and dry. No rash noted. No erythema.  Psychiatric: She has a normal mood and affect. Her behavior is normal. Judgment and thought content normal.    Assessment and Plan

## 2013-10-02 NOTE — Assessment & Plan Note (Signed)
She commented numerous times on her incontinence. We will hold her HCTZ and given her high blood pressure in the past several days we'll start amlodipine benazepril 5/40 mg daily. We've asked her to monitor her blood pressure and followup closely with Dr. Alphonsus SiasLetvak.

## 2013-10-03 LAB — URINE CULTURE

## 2013-10-05 ENCOUNTER — Ambulatory Visit: Payer: Medicare Other | Admitting: Cardiovascular Disease

## 2013-10-06 NOTE — Telephone Encounter (Signed)
Pt has appt 10/20/13, seen by cardiology

## 2013-10-14 ENCOUNTER — Ambulatory Visit: Payer: Medicare Other | Admitting: Internal Medicine

## 2013-10-20 ENCOUNTER — Encounter: Payer: Self-pay | Admitting: Internal Medicine

## 2013-10-20 ENCOUNTER — Ambulatory Visit (INDEPENDENT_AMBULATORY_CARE_PROVIDER_SITE_OTHER): Payer: Commercial Managed Care - HMO | Admitting: Internal Medicine

## 2013-10-20 VITALS — BP 138/60 | HR 79 | Temp 97.7°F | Wt 162.0 lb

## 2013-10-20 DIAGNOSIS — R42 Dizziness and giddiness: Secondary | ICD-10-CM

## 2013-10-20 DIAGNOSIS — F39 Unspecified mood [affective] disorder: Secondary | ICD-10-CM

## 2013-10-20 DIAGNOSIS — M810 Age-related osteoporosis without current pathological fracture: Secondary | ICD-10-CM

## 2013-10-20 DIAGNOSIS — I10 Essential (primary) hypertension: Secondary | ICD-10-CM

## 2013-10-20 DIAGNOSIS — K219 Gastro-esophageal reflux disease without esophagitis: Secondary | ICD-10-CM

## 2013-10-20 DIAGNOSIS — E785 Hyperlipidemia, unspecified: Secondary | ICD-10-CM

## 2013-10-20 DIAGNOSIS — R32 Unspecified urinary incontinence: Secondary | ICD-10-CM

## 2013-10-20 LAB — CBC WITH DIFFERENTIAL/PLATELET
BASOS ABS: 0.1 10*3/uL (ref 0.0–0.1)
Basophils Relative: 0.7 % (ref 0.0–3.0)
Eosinophils Absolute: 0.3 10*3/uL (ref 0.0–0.7)
Eosinophils Relative: 3.9 % (ref 0.0–5.0)
HCT: 42.9 % (ref 36.0–46.0)
Hemoglobin: 14.4 g/dL (ref 12.0–15.0)
Lymphocytes Relative: 22.7 % (ref 12.0–46.0)
Lymphs Abs: 1.7 10*3/uL (ref 0.7–4.0)
MCHC: 33.5 g/dL (ref 30.0–36.0)
MCV: 92.3 fl (ref 78.0–100.0)
MONOS PCT: 6.8 % (ref 3.0–12.0)
Monocytes Absolute: 0.5 10*3/uL (ref 0.1–1.0)
NEUTROS PCT: 65.9 % (ref 43.0–77.0)
Neutro Abs: 5.1 10*3/uL (ref 1.4–7.7)
PLATELETS: 197 10*3/uL (ref 150.0–400.0)
RBC: 4.65 Mil/uL (ref 3.87–5.11)
RDW: 13.1 % (ref 11.5–14.6)
WBC: 7.7 10*3/uL (ref 4.5–10.5)

## 2013-10-20 LAB — LIPID PANEL
CHOL/HDL RATIO: 4
Cholesterol: 226 mg/dL — ABNORMAL HIGH (ref 0–200)
HDL: 56.9 mg/dL (ref >39.00)
LDL CALC: 145 mg/dL — AB (ref 0–99)
TRIGLYCERIDES: 122 mg/dL (ref 0.0–149.0)
VLDL: 24.4 mg/dL (ref 0.0–40.0)

## 2013-10-20 LAB — COMPREHENSIVE METABOLIC PANEL
ALT: 20 U/L (ref 0–35)
AST: 24 U/L (ref 0–37)
Albumin: 4.3 g/dL (ref 3.5–5.2)
Alkaline Phosphatase: 57 U/L (ref 39–117)
BUN: 19 mg/dL (ref 6–23)
CO2: 29 meq/L (ref 19–32)
CREATININE: 0.8 mg/dL (ref 0.4–1.2)
Calcium: 10.1 mg/dL (ref 8.4–10.5)
Chloride: 106 mEq/L (ref 96–112)
GFR: 77.64 mL/min (ref >60.00)
GLUCOSE: 104 mg/dL — AB (ref 70–99)
Potassium: 3.8 mEq/L (ref 3.5–5.1)
SODIUM: 142 meq/L (ref 135–145)
Total Bilirubin: 0.8 mg/dL (ref 0.3–1.2)
Total Protein: 7.3 g/dL (ref 6.0–8.3)

## 2013-10-20 LAB — TSH: TSH: 2 u[IU]/mL (ref 0.35–5.50)

## 2013-10-20 LAB — T4, FREE: FREE T4: 1.06 ng/dL (ref 0.60–1.60)

## 2013-10-20 NOTE — Assessment & Plan Note (Signed)
Quiet on the med 

## 2013-10-20 NOTE — Assessment & Plan Note (Signed)
Gives her lots of trouble at times Sedation with meclizine but it helps Discussed using lowest effective dose

## 2013-10-20 NOTE — Assessment & Plan Note (Signed)
Is on vitamin D 

## 2013-10-20 NOTE — Assessment & Plan Note (Signed)
Anxiety at times Not bad enough for meds

## 2013-10-20 NOTE — Progress Notes (Signed)
Pre visit review using our clinic review tool, if applicable. No additional management support is needed unless otherwise documented below in the visit note. 

## 2013-10-20 NOTE — Patient Instructions (Signed)
Please bring your blood pressure cuff to your next appointment

## 2013-10-20 NOTE — Assessment & Plan Note (Signed)
Discussed primary prevention--she prefers to stay off meds (other than red yeast rice)

## 2013-10-20 NOTE — Assessment & Plan Note (Signed)
BP Readings from Last 3 Encounters:  10/20/13 138/60  10/02/13 162/86  04/15/13 128/70   Better now Generally tolerating the new med Still gets some high numbers at home--will have her bring her machine to her next visit

## 2013-10-20 NOTE — Assessment & Plan Note (Signed)
Better off the diuretic but persists Uses pad

## 2013-10-20 NOTE — Progress Notes (Signed)
Subjective:    Patient ID: Yvette Thomas, female    DOB: 11/07/1924, 80 y.o.   MRN: [id number]  HPI Reviewed recent history ER visit than follow up with Dr Mariah MillingGollan meds changed--off diuretic and on amlodipine/benazepril Takes her BP regularly---very high at home at times. 193/92 this AM--but fine here (might have been anxious) Usually 140-160/80's  No headaches Still foggy and dull and wobbly on feet Vertigo has returned--- but better right now again Meclizine helps the vertigo but feels foggy. Even at half dose  No longer having urinary spasm and less frequently Still has incontinence and needs pad Never had burning---discussed that she probably had asymptomatic bacteriuria  Stomach fine on the PPI No swallowing problems  Current Outpatient Prescriptions on File Prior to Visit  Medication Sig Dispense Refill  . amLODipine-benazepril (LOTREL) 5-40 MG per capsule Take 1 capsule by mouth daily.  30 capsule  6  . Calcium Carbonate-Vitamin D (CALCIUM 600+D) 600-400 MG-UNIT per tablet Take 1 tablet by mouth 2 (two) times daily.        Marland Kitchen. ibuprofen (ADVIL,MOTRIN) 200 MG tablet Take 200-400 mg by mouth 2 (two) times daily as needed. For arthritis pain      . Multiple Vitamins-Minerals (CENTRUM SILVER PO) Take 1 tablet by mouth daily.        . Omega-3 Fatty Acids (FISH OIL) 1000 MG CAPS Take 1 capsule by mouth daily.        Marland Kitchen. omeprazole (PRILOSEC) 20 MG capsule Take 20 mg by mouth daily.      . Vitamins-Lipotropics (B-50 COMPLEX) TABS Take 1 tablet by mouth daily.         No current facility-administered medications on file prior to visit.    Allergies  Allergen Reactions  . Buspirone Hcl     REACTION: Tinnitis, dizzy  . Oxybutynin Chloride     REACTION: Dried up  . Tolterodine Tartrate     REACTION: Headache    Past Medical History  Diagnosis Date  . Colon polyps   . GERD (gastroesophageal reflux disease)   . Hyperlipidemia   . Hypertension   . Hypothyroidism   .  Osteoporosis   . Fibrocystic breast   . Urinary incontinence   . Allergic rhinitis, cause unspecified   . Osteoarthritis of multiple joints   . Anxiety   . Syncope and collapse     Hx  . Pyelitis   . Vertigo     Past Surgical History  Procedure Laterality Date  . Thyroidectomy, partial  1996    Family History  Problem Relation Age of Onset  . Hypertension Sister   . Melanoma Sister   . Heart disease Mother   . Heart disease Father   . Hypertension Brother     History   Social History  . Marital Status: Widowed    Spouse Name: N/A    Number of Children: 2  . Years of Education: N/A   Occupational History  . RETIRED     [employer]   Social History Main Topics  . Smoking status: Former Smoker -- 25 years    Quit date: 07/02/1968  . Smokeless tobacco: Never Used  . Alcohol Use: No  . Drug Use: No  . Sexual Activity: Not on file   Other Topics Concern  . Not on file   Social History Narrative   Has living will    No formal health care POA but requests daughter.   Would accept resuscitation attempts but no  prolonged artificial ventilation.   Would consider tube feedings   Review of Systems Appetite is fine Weight is stable--wants to lose some of her stomach Sleeps okay Slight pedal edema by the end of the day---better in AM No dizziness or syncope    Objective:   Physical Exam  Constitutional: She appears well-developed and well-nourished. No distress.  Neck: Normal range of motion. Neck supple. No thyromegaly present.  Cardiovascular: Normal rate, regular rhythm and normal heart sounds.  Exam reveals no gallop.   No murmur heard. Pulmonary/Chest: Effort normal and breath sounds normal. No respiratory distress. She has no wheezes. She has no rales.  Musculoskeletal: She exhibits no edema and no tenderness.  Lymphadenopathy:    She has no cervical adenopathy.  Psychiatric: She has a normal mood and affect. Her behavior is normal.           Assessment & Plan:

## 2013-10-21 ENCOUNTER — Telehealth: Payer: Self-pay | Admitting: Internal Medicine

## 2013-10-21 NOTE — Telephone Encounter (Signed)
Relevant patient education assigned to patient using Emmi. ° °

## 2014-01-04 ENCOUNTER — Encounter: Payer: Self-pay | Admitting: Cardiovascular Disease

## 2014-01-04 ENCOUNTER — Ambulatory Visit (INDEPENDENT_AMBULATORY_CARE_PROVIDER_SITE_OTHER): Payer: Commercial Managed Care - HMO | Admitting: Cardiovascular Disease

## 2014-01-04 VITALS — BP 152/80 | HR 72 | Ht 60.0 in | Wt 164.5 lb

## 2014-01-04 DIAGNOSIS — R42 Dizziness and giddiness: Secondary | ICD-10-CM

## 2014-01-04 DIAGNOSIS — E785 Hyperlipidemia, unspecified: Secondary | ICD-10-CM

## 2014-01-04 DIAGNOSIS — R32 Unspecified urinary incontinence: Secondary | ICD-10-CM

## 2014-01-04 DIAGNOSIS — I10 Essential (primary) hypertension: Secondary | ICD-10-CM

## 2014-01-04 MED ORDER — CLONIDINE HCL 0.1 MG PO TABS: 0.1000 mg | ORAL_TABLET | Freq: Two times a day (BID) | ORAL | Status: DC

## 2014-01-04 MED ORDER — BENAZEPRIL HCL 40 MG PO TABS: 40.0000 mg | ORAL_TABLET | Freq: Every day | ORAL | Status: DC

## 2014-01-04 NOTE — Progress Notes (Signed)
Patient ID: Yvette Thomas, female    DOB: Mar 05, 1925, 80 y.o.   MRN: [id number]  HPI Comments: Yvette Thomas is a pleasant 80 year old woman with history of hypertension, hyperlipidemia, remote smoking history for at least 20 years, urine incontinence, seen in the emergency room  10/01/2013 for abnormal EKG, malaise, who presents for routine followup.  On her prior clinic visit, triamterene HCTZ was held for symptoms of incontinence, palpitations, dizziness. In followup today she reports that the symptoms have resolved. On the other hand she has developed worsening lower extremity edema. She attributes this to the new blood pressure pill. Otherwise she feels well overall. She does report having some continued incontinence. No spasms.  She has difficulty using her blood pressure cuff machine at home  On her last clinic visit she had vertigo symptoms. She has been taking her meclizine. Blood pressure measurements at home were elevated She was sent to the emergency room . EKG showed normal sinus rhythm with right bundle branch block, T-wave abnormality in the anterolateral and inferior leads, UA was slightly positive with  urine culture growing Escherichia coli 80K cfu/ml. CBC was normal, basic metabolic panel essentially normal, negative cardiac enzymes.  CT scan of the head showed mild cerebral atrophy, chronic small vessel ischemic changes   Outpatient Encounter Prescriptions as of 01/04/2014  Medication Sig  . amLODipine-benazepril (LOTREL) 5-40 MG per capsule Take 1 capsule by mouth daily.  . Calcium Carbonate-Vitamin D (CALCIUM 600+D) 600-400 MG-UNIT per tablet Take 1 tablet by mouth 2 (two) times daily.    Marland Kitchen. ibuprofen (ADVIL,MOTRIN) 200 MG tablet Take 200-400 mg by mouth 2 (two) times daily as needed. For arthritis pain  . Multiple Vitamins-Minerals (CENTRUM SILVER PO) Take 1 tablet by mouth daily.    . Omega-3 Fatty Acids (FISH OIL) 1000 MG CAPS Take 1 capsule by mouth daily.    Marland Kitchen. omeprazole  (PRILOSEC) 20 MG capsule Take 20 mg by mouth daily.  . Red Yeast Rice 600 MG CAPS Take 1 capsule by mouth daily.  . Vitamins-Lipotropics (B-50 COMPLEX) TABS Take 1 tablet by mouth daily.    . [DISCONTINUED] aspirin 81 MG tablet Take 81 mg by mouth daily.     Review of Systems  Constitutional: Negative.   HENT: Negative.   Eyes: Negative.   Respiratory: Negative.   Cardiovascular: Positive for leg swelling.  Gastrointestinal: Negative.   Endocrine: Negative.   Genitourinary:       Incontinence  Musculoskeletal: Negative.   Skin: Negative.   Allergic/Immunologic: Negative.   Neurological: Negative.        Vertigo  Hematological: Negative.   Psychiatric/Behavioral: Negative.   All other systems reviewed and are negative.   BP 152/80  Pulse 72  Ht 5' (1.524 m)  Wt 164 lb 8 oz (74.617 kg)  BMI 32.13 kg/m2  Physical Exam  Nursing note and vitals reviewed. Constitutional: She is oriented to person, place, and time. She appears well-developed and well-nourished.  HENT:  Head: Normocephalic.  Nose: Nose normal.  Mouth/Throat: Oropharynx is clear and moist.  Eyes: Conjunctivae are normal. Pupils are equal, round, and reactive to light.  Neck: Normal range of motion. Neck supple. No JVD present.  Cardiovascular: Normal rate, regular rhythm, S1 normal, S2 normal, normal heart sounds and intact distal pulses.  Exam reveals no gallop and no friction rub.   No murmur heard. 1+ pitting edema to the mid shins bilaterally  Pulmonary/Chest: Effort normal and breath sounds normal. No respiratory distress. She has no  wheezes. She has no rales. She exhibits no tenderness.  Abdominal: Soft. Bowel sounds are normal. She exhibits no distension. There is no tenderness.  Musculoskeletal: Normal range of motion. She exhibits no edema and no tenderness.  Lymphadenopathy:    She has no cervical adenopathy.  Neurological: She is alert and oriented to person, place, and time. Coordination normal.   Skin: Skin is warm and dry. No rash noted. No erythema.  Psychiatric: She has a normal mood and affect. Her behavior is normal. Judgment and thought content normal.    Assessment and Plan

## 2014-01-04 NOTE — Assessment & Plan Note (Signed)
HCTZ held on her prior clinic visit in an effort to make her incontinence better. She reports having less spasm, possibly still having symptoms even without HCTZ.

## 2014-01-04 NOTE — Assessment & Plan Note (Signed)
We did not discuss her cholesterol with her on today's visit. Given recent side effects from the amlodipine, would not start any new medications at this time. We'll discuss whether she would like to be on a cholesterol medication in the future. She has indicated that she does not like taking medications.

## 2014-01-04 NOTE — Assessment & Plan Note (Signed)
She does not check her blood pressure at home. Borderline elevated today. Given her worsening leg edema, we will hold the amlodipine benazepril and continue benazepril 40 mg daily. We will try to avoid adding the HCTZ back as she reports her vertigo/palpitations, incontinence may be better. We have suggested she try clonidine 0.1 mg twice a day and asked her to call us if she has side effects of dry mouth. Also suggested she bring in her blood pressure cuff to our office or to Dr. Alphonsus SiasLetvak to have this checked.

## 2014-01-04 NOTE — Patient Instructions (Addendum)
Please hold the benazepril  amlodipine  Start benazepril one a day  Also start clonidine one pill twice a day  Please call us if you have new issues that need to be addressed before your next appt.  Your physician wants you to follow-up in: 1 month

## 2014-01-04 NOTE — Assessment & Plan Note (Signed)
Reports her vertigo has resolved. No longer taking meclizine

## 2014-01-19 ENCOUNTER — Ambulatory Visit (INDEPENDENT_AMBULATORY_CARE_PROVIDER_SITE_OTHER): Payer: Commercial Managed Care - HMO | Admitting: Internal Medicine

## 2014-01-19 ENCOUNTER — Encounter: Payer: Self-pay | Admitting: Internal Medicine

## 2014-01-19 VITALS — BP 148/80 | HR 65 | Temp 98.0°F | Wt 158.0 lb

## 2014-01-19 DIAGNOSIS — I10 Essential (primary) hypertension: Secondary | ICD-10-CM

## 2014-01-19 NOTE — Progress Notes (Signed)
Pre visit review using our clinic review tool, if applicable. No additional management support is needed unless otherwise documented below in the visit note. 

## 2014-01-19 NOTE — Progress Notes (Signed)
Subjective:    Patient ID: Yvette Thomas, female    DOB: Feb 07, 1925, 80 y.o.   MRN: [id number]  HPI Here with sister Yvette Thomas  Now off the amlodipine---leg swelling is some better but still noted at night Off the HCTZ--still notes some incontinence   Has some chest pain from hiatal hernia Will try a tums-this helps Has to sleep sitting up--- otherwise will get the symptoms back No PND  Does note some swimmy headedness-- also some vertigo which is different Gets pressure in head at times Some DOE--may be worse over the last few months  Did test her new BP cuff here--seemed to correlate with our cuff  Current Outpatient Prescriptions on File Prior to Visit  Medication Sig Dispense Refill  . benazepril (LOTENSIN) 40 MG tablet Take 1 tablet (40 mg total) by mouth daily.  30 tablet  6  . Calcium Carbonate-Vitamin D (CALCIUM 600+D) 600-400 MG-UNIT per tablet Take 1 tablet by mouth 2 (two) times daily.        . cloNIDine (CATAPRES) 0.1 MG tablet Take 1 tablet (0.1 mg total) by mouth 2 (two) times daily.  60 tablet  11  . ibuprofen (ADVIL,MOTRIN) 200 MG tablet Take 200-400 mg by mouth 2 (two) times daily as needed. For arthritis pain      . Multiple Vitamins-Minerals (CENTRUM SILVER PO) Take 1 tablet by mouth daily.        . Omega-3 Fatty Acids (FISH OIL) 1000 MG CAPS Take 1 capsule by mouth daily.        Marland Kitchen. omeprazole (PRILOSEC) 20 MG capsule Take 20 mg by mouth daily.      . Red Yeast Rice 600 MG CAPS Take 1 capsule by mouth daily.      . Vitamins-Lipotropics (B-50 COMPLEX) TABS Take 1 tablet by mouth daily.         No current facility-administered medications on file prior to visit.    Allergies  Allergen Reactions  . Buspirone Hcl     REACTION: Tinnitis, dizzy  . Oxybutynin Chloride     REACTION: Dried up  . Tolterodine Tartrate     REACTION: Headache    Past Medical History  Diagnosis Date  . Colon polyps   . GERD (gastroesophageal reflux disease)   . Hyperlipidemia     . Hypertension   . Hypothyroidism   . Osteoporosis   . Fibrocystic breast   . Urinary incontinence   . Allergic rhinitis, cause unspecified   . Osteoarthritis of multiple joints   . Anxiety   . Syncope and collapse     Hx  . Pyelitis   . Vertigo     Past Surgical History  Procedure Laterality Date  . Thyroidectomy, partial  1996    Family History  Problem Relation Age of Onset  . Hypertension Sister   . Melanoma Sister   . Heart disease Mother   . Heart disease Father   . Hypertension Brother     History   Social History  . Marital Status: Widowed    Spouse Name: N/A    Number of Children: 2  . Years of Education: N/A   Occupational History  . RETIRED     [employer]   Social History Main Topics  . Smoking status: Former Smoker -- 25 years    Quit date: 07/02/1968  . Smokeless tobacco: Never Used  . Alcohol Use: No  . Drug Use: No  . Sexual Activity: Not on file   Other  Topics Concern  . Not on file   Social History Narrative   Has living will    No formal health care POA but requests daughter.   Would accept resuscitation attempts but no prolonged artificial ventilation.   Would consider tube feedings   Review of Systems Has gained a few pounds Constipated recently--better with stool softener--relates to being tense    Objective:   Physical Exam  Constitutional: She appears well-developed and well-nourished. No distress.  Neck: Normal range of motion. Neck supple. No thyromegaly present.  Cardiovascular: Normal rate, regular rhythm and normal heart sounds.  Exam reveals no gallop.   No murmur heard. Pulmonary/Chest: Effort normal and breath sounds normal. No respiratory distress. She has no wheezes. She has no rales.  Musculoskeletal: She exhibits no edema.  Lymphadenopathy:    She has no cervical adenopathy.  Psychiatric:   A little anxious          Assessment & Plan:

## 2014-01-19 NOTE — Assessment & Plan Note (Signed)
BP Readings from Last 3 Encounters:  01/19/14 148/80  01/04/14 152/80  10/20/13 138/60   Okay off the amlodipine---edema definitely better and weight down 6# Was only taking the clonidine once a day---told her to take it bid. May want to change to patch for predictable dosing and less risk of rebound Has follow up coming up with Dr Mariah MillingGollan

## 2014-01-27 ENCOUNTER — Telehealth: Payer: Self-pay

## 2014-01-27 NOTE — Telephone Encounter (Signed)
Pt wants to know if she should renew her BP medication. States she has a lot of other questions about how she has been feeling, dizzy, and some vertigo. Please call.

## 2014-01-27 NOTE — Telephone Encounter (Signed)
Would continue on clonidine Refill clonidine 0.1 mg TID or 1 1/2 pills BID Would monitor BP for a few more days If still elevated, call us on Friday or Monday

## 2014-01-27 NOTE — Telephone Encounter (Signed)
Spoke w/ pt.  She reports that she was prescribed clonidine 0.1mg  BID at her last ov to try and see if this would help w/ her BP.  She reports a 3 day average of 167/90; she woke up w/ HA this am, 192/100, after her meds 162/91. She reports that her ankles are still a little swollen, she his continuing to have vertigo, fogginess, anxiety, dry mouth, and reflux from her hernia.  She has an appt w/ Dr. Mariah MillingGollan on 8/10, but would like to know if she should refill her clonidine or if something else needs to be called in.  Please advise.  Thank you.

## 2014-01-28 NOTE — Telephone Encounter (Signed)
Patient called again. She is going out please call her on her cell phone # [phone]

## 2014-01-28 NOTE — Telephone Encounter (Signed)
Spoke w/ pt.  Advised her of Dr. Windell HummingbirdGollan's recommendation.  She verbalizes understanding and will call if sx do not resolve.

## 2014-02-08 ENCOUNTER — Encounter: Payer: Self-pay | Admitting: Cardiovascular Disease

## 2014-02-08 ENCOUNTER — Ambulatory Visit (INDEPENDENT_AMBULATORY_CARE_PROVIDER_SITE_OTHER): Payer: Commercial Managed Care - HMO | Admitting: Cardiovascular Disease

## 2014-02-08 VITALS — BP 180/82 | HR 62 | Ht 60.0 in | Wt 158.5 lb

## 2014-02-08 DIAGNOSIS — E785 Hyperlipidemia, unspecified: Secondary | ICD-10-CM

## 2014-02-08 DIAGNOSIS — I10 Essential (primary) hypertension: Secondary | ICD-10-CM

## 2014-02-08 DIAGNOSIS — F39 Unspecified mood [affective] disorder: Secondary | ICD-10-CM

## 2014-02-08 DIAGNOSIS — R9431 Abnormal electrocardiogram [ECG] [EKG]: Secondary | ICD-10-CM

## 2014-02-08 MED ORDER — DOXAZOSIN MESYLATE 4 MG PO TABS: 4.0000 mg | ORAL_TABLET | Freq: Every day | ORAL | Status: DC

## 2014-02-08 NOTE — Patient Instructions (Addendum)
Your heart rate at home is slow Only take clonidine twice a day  Please start doxazosin 4 mg every evening Monitor your blood pressure and call the office with numbers  Walk daily for leg strength  Consider holding the vit C and fish oil pills  Please call us if you have new issues that need to be addressed before your next appt.  Your physician wants you to follow-up in: 1 month

## 2014-02-08 NOTE — Assessment & Plan Note (Signed)
Heart rate is very low, possibly from the clonidine. We will decrease the dose back to 0.1 mg twice a day with close monitoring of her pulse rate. For high pressure, we will avoid calcium channel blockers and diuretics as she has had side effects on both. We'll start doxazosin 4 mg every evening with close monitoring of her blood pressure. This could be increased up to 4 mg twice a day if tolerated. Other options include hydralazine.

## 2014-02-08 NOTE — Assessment & Plan Note (Signed)
Currently not on a statin. Recommended weight loss through diet management. Also strongly recommended daily walking

## 2014-02-08 NOTE — Assessment & Plan Note (Signed)
She reports having significant anxiety and would like a pill for that. Recommended she talk with primary care. Strongly recommended she start a daily walking program as this would help her mood and her sleep.

## 2014-02-08 NOTE — Progress Notes (Signed)
Patient ID: Yvette Thomas, female    DOB: [birth year], 80 y.o.   MRN: [id number]  HPI Comments: [name] is a pleasant 80 year old woman with history of hypertension, hyperlipidemia, remote smoking history for at least 20 years, urine incontinence, seen in the emergency room  10/01/2013 for abnormal EKG, malaise, who presents for routine followup. She reports having a Hiatal hernia She feels that she has a problem with anxiety  In followup today, she reports that her blood pressure has been elevated at home. Typically systolic pressure runs between 160 and 190, diastolic 80 into the 90s, heart rates in the 40s.  On her prior clinic visit, triamterene HCTZ was held for symptoms of incontinence, palpitations, dizziness. Calcium channel blocker caused leg swelling She was started on clonidine Now she is taking clonidine 0.1 mg 3 times a day and continues to have high blood pressure  Previous history of vertigo symptoms. She was previously taking  meclizine.   EKG shows normal sinus rhythm with rate 62 beats per minute, right bundle branch block    Outpatient Encounter Prescriptions as of 02/08/2014  Medication Sig  . aspirin EC 81 MG tablet Take 81 mg by mouth every other day.  . benazepril (LOTENSIN) 40 MG tablet Take 1 tablet (40 mg total) by mouth daily.  . Calcium Carbonate-Vitamin D (CALCIUM 600+D) 600-400 MG-UNIT per tablet Take 1 tablet by mouth 2 (two) times daily.    . Cholecalciferol (VITAMIN D-3 PO) Take 400 Units by mouth daily.  . cloNIDine (CATAPRES) 0.1 MG tablet Take 1 tablet (0.1 mg total) by mouth 2 (two) times daily.  Marland Kitchen ibuprofen (ADVIL,MOTRIN) 200 MG tablet Take 200-400 mg by mouth 2 (two) times daily as needed. For arthritis pain  . Multiple Vitamins-Minerals (CENTRUM SILVER PO) Take 1 tablet by mouth daily.    . Omega-3 Fatty Acids (FISH OIL) 1000 MG CAPS Take 1 capsule by mouth daily.    Marland Kitchen omeprazole (PRILOSEC) 20 MG capsule Take 20 mg by mouth daily.  . Red Yeast  Rice 600 MG CAPS Take 1 capsule by mouth daily.  . Vitamins-Lipotropics (B-50 COMPLEX) TABS Take 1 tablet by mouth daily.      Review of Systems  Constitutional: Negative.   HENT: Negative.   Eyes: Negative.   Respiratory: Negative.   Cardiovascular: Positive for leg swelling.  Gastrointestinal: Negative.   Endocrine: Negative.   Genitourinary:       Incontinence  Musculoskeletal: Negative.   Skin: Negative.   Allergic/Immunologic: Negative.   Neurological: Negative.        Vertigo  Hematological: Negative.   Psychiatric/Behavioral: The patient is nervous/anxious.   All other systems reviewed and are negative.   BP 180/82  Pulse 62  Ht 5' (1.524 m)  Wt 158 lb 8 oz (71.895 kg)  BMI 30.95 kg/m2  Physical Exam  Nursing note and vitals reviewed. Constitutional: She is oriented to person, place, and time. She appears well-developed and well-nourished.  HENT:  Head: Normocephalic.  Nose: Nose normal.  Mouth/Throat: Oropharynx is clear and moist.  Eyes: Conjunctivae are normal. Pupils are equal, round, and reactive to light.  Neck: Normal range of motion. Neck supple. No JVD present.  Cardiovascular: Normal rate, regular rhythm, S1 normal, S2 normal, normal heart sounds and intact distal pulses.  Exam reveals no gallop and no friction rub.   No murmur heard. 1+ pitting edema to the mid shins bilaterally  Pulmonary/Chest: Effort normal and breath sounds normal. No respiratory distress. She has no  wheezes. She has no rales. She exhibits no tenderness.  Abdominal: Soft. Bowel sounds are normal. She exhibits no distension. There is no tenderness.  Musculoskeletal: Normal range of motion. She exhibits no edema and no tenderness.  Lymphadenopathy:    She has no cervical adenopathy.  Neurological: She is alert and oriented to person, place, and time. Coordination normal.  Skin: Skin is warm and dry. No rash noted. No erythema.  Psychiatric: She has a normal mood and affect. Her  behavior is normal. Judgment and thought content normal.    Assessment and Plan

## 2014-02-12 ENCOUNTER — Telehealth: Payer: Self-pay | Admitting: *Deleted

## 2014-02-12 NOTE — Telephone Encounter (Signed)
Patient called and is having problems with Doxazosin . She is out of focus on this medicine. Her bp this morning 188/108 p 88

## 2014-02-12 NOTE — Telephone Encounter (Signed)
Spoke w/pt. °

## 2014-02-12 NOTE — Telephone Encounter (Signed)
Spoke w/ pt.  She reports that the first 2 days after starting doxazosin, she had good days with no headaches, and her BP was good.  Reports that this am, she got a phone call and could not remember basic info, such as her phone number or zip code. She became upset and her BP went up to 188/108, HR 88 and she did not feel good.  She calmed down, ate something and rechecked her BP 181/91, HR 56. Advised pt to monitor her BP over the weekend, but she states that Dr. Mariah MillingGollan told her to call if anything unusual happened and she requests that I make him aware. She repeatedly asks for an antianxiety med, as Dr. Alphonsus SiasLetvak will not prescribe for her, as he fears it may interfere w/ her meds.  Please advise.  Thank you.

## 2014-02-15 NOTE — Telephone Encounter (Signed)
Would follow up today with blood pressure Has she started a walking program? She needs to walk daily for exercise

## 2014-02-15 NOTE — Telephone Encounter (Signed)
Spoke w/ pt.  She reports that her BP was "great" over the weekend w/ no headaches:  Sat   133/72, HR 64 Sun 135/71, HR 68 She will drop off log of BP records next week.

## 2014-02-18 ENCOUNTER — Telehealth: Payer: Self-pay | Admitting: *Deleted

## 2014-02-18 ENCOUNTER — Telehealth: Payer: Self-pay | Admitting: Internal Medicine

## 2014-02-18 NOTE — Telephone Encounter (Signed)
Please let her know that anxiety meds can have lots of side effects  If she just needs something short term, okay to send rx for hydroxyzine 25mg  1/2-1 tab tid prn for anxiety #30 x 0 If this isn't helpful, or she has ongoing symptoms, she really needs an appt to discuss this

## 2014-02-18 NOTE — Telephone Encounter (Signed)
Pt called requesting something for anxiety. She would not give me any other details other than she has some issues going on. Please advise.

## 2014-02-19 ENCOUNTER — Ambulatory Visit (INDEPENDENT_AMBULATORY_CARE_PROVIDER_SITE_OTHER): Payer: Commercial Managed Care - HMO | Admitting: Internal Medicine

## 2014-02-19 ENCOUNTER — Encounter: Payer: Self-pay | Admitting: Internal Medicine

## 2014-02-19 ENCOUNTER — Ambulatory Visit: Payer: Commercial Managed Care - HMO | Admitting: Internal Medicine

## 2014-02-19 VITALS — BP 150/80 | HR 74 | Temp 98.3°F | Wt 158.0 lb

## 2014-02-19 DIAGNOSIS — F411 Generalized anxiety disorder: Secondary | ICD-10-CM | POA: Insufficient documentation

## 2014-02-19 MED ORDER — ALPRAZOLAM 0.25 MG PO TABS: 0.1250 mg | ORAL_TABLET | Freq: Two times a day (BID) | ORAL | Status: DC | PRN

## 2014-02-19 NOTE — Progress Notes (Signed)
Pre visit review using our clinic review tool, if applicable. No additional management support is needed unless otherwise documented below in the visit note. 

## 2014-02-19 NOTE — Assessment & Plan Note (Signed)
Seems to be reactive to health issues with siblings, her BP and multiple medical visits No GAD and no depression At peace but fears disability Has dredged up old guilt also--discussed speaking to pastor  Will give alprazolam for emergency use Continue regular exercise

## 2014-02-19 NOTE — Telephone Encounter (Signed)
Spoke with patient and she will come in today at 12:30 for an appt to discuss with Dr. Alphonsus SiasLetvak

## 2014-02-19 NOTE — Patient Instructions (Signed)
If your mind is racing and the anxiety gets bad, that is when you should try the anxiety medication. Try 1/2 first, stay at home and don't drive until you see how the medication affects you.

## 2014-02-19 NOTE — Progress Notes (Signed)
Subjective:    Patient ID: Yvette Thomas, female    DOB: [birth year], 80 y.o.   MRN: [id number]  HPI Here with daughter  Has been feeling anxious Feels it goes back about 4 months Wonders if it is related to the higher BP Gets upset everytime it is taken Had ER visit Now bothered with the recent changes Sister in hospital--- may be dying Brother with recent stroke also They are a couple of years older---thinking she will be next  No ongoing anxiety problems Not depressed Doesn't have suicidal ideation. Fears disability but not really death though Fears not pleasing people---is perfectionist Has lots she "has to get done" --like cleaning house  Having daily anxiety now She can feel the pressure building in her face when the spells come down Has been trying to walk in her house--feels wobbly  Has strong faith Active in church  Current Outpatient Prescriptions on File Prior to Visit  Medication Sig Dispense Refill  . aspirin EC 81 MG tablet Take 81 mg by mouth every other day.      . benazepril (LOTENSIN) 40 MG tablet Take 1 tablet (40 mg total) by mouth daily.  30 tablet  6  . Calcium Carbonate-Vitamin D (CALCIUM 600+D) 600-400 MG-UNIT per tablet Take 1 tablet by mouth 2 (two) times daily.        . Cholecalciferol (VITAMIN D-3 PO) Take 400 Units by mouth daily.      . cloNIDine (CATAPRES) 0.1 MG tablet Take 1 tablet (0.1 mg total) by mouth 2 (two) times daily.  60 tablet  11  . doxazosin (CARDURA) 4 MG tablet Take 1 tablet (4 mg total) by mouth daily.  30 tablet  6  . ibuprofen (ADVIL,MOTRIN) 200 MG tablet Take 200-400 mg by mouth 2 (two) times daily as needed. For arthritis pain      . Multiple Vitamins-Minerals (CENTRUM SILVER PO) Take 1 tablet by mouth daily.        . Omega-3 Fatty Acids (FISH OIL) 1000 MG CAPS Take 1 capsule by mouth daily.        Marland Kitchen. omeprazole (PRILOSEC) 20 MG capsule Take 20 mg by mouth daily.      . Red Yeast Rice 600 MG CAPS Take 1 capsule by mouth  daily.      . Vitamins-Lipotropics (B-50 COMPLEX) TABS Take 1 tablet by mouth daily.         No current facility-administered medications on file prior to visit.    Allergies  Allergen Reactions  . Buspirone Hcl     REACTION: Tinnitis, dizzy  . Oxybutynin Chloride     REACTION: Dried up  . Tolterodine Tartrate     REACTION: Headache    Past Medical History  Diagnosis Date  . Colon polyps   . GERD (gastroesophageal reflux disease)   . Hyperlipidemia   . Hypertension   . Hypothyroidism   . Osteoporosis   . Fibrocystic breast   . Urinary incontinence   . Allergic rhinitis, cause unspecified   . Osteoarthritis of multiple joints   . Anxiety   . Syncope and collapse     Hx  . Pyelitis   . Vertigo     Past Surgical History  Procedure Laterality Date  . Thyroidectomy, partial  1996    Family History  Problem Relation Age of Onset  . Hypertension Sister   . Melanoma Sister   . Heart disease Mother   . Heart disease Father   . Hypertension Brother  History   Social History  . Marital Status: Widowed    Spouse Name: N/A    Number of Children: 2  . Years of Education: N/A   Occupational History  . RETIRED     [employer]   Social History Main Topics  . Smoking status: Former Smoker -- 25 years    Quit date: 07/02/1968  . Smokeless tobacco: Never Used  . Alcohol Use: No  . Drug Use: No  . Sexual Activity: Not on file   Other Topics Concern  . Not on file   Social History Narrative   Has living will    No formal health care POA but requests daughter.   Would accept resuscitation attempts but no prolonged artificial ventilation.   Would consider tube feedings   Review of Systems Some tinnitus Sleep is always a problem--initiates late, falls asleep on couch, etc Some headache with recent BP med changes Seems to be settling into current med regimen Appetite is okay     Objective:   Physical Exam  Constitutional: She appears  well-developed and well-nourished. No distress.  Psychiatric:  Mildly anxious Not depressed Remains independent and socially active Normal speech and appearance No thought process disturbance          Assessment & Plan:

## 2014-02-25 NOTE — Telephone Encounter (Signed)
Pt dropped off recent BP readings:  8/12 4:45pm 163/81, 63 8/13 10:30pm 161/80, 66 8/14 1:15pm 181/91, 56 8/15 11:20am 133/72, 64 8/16 5:10pm 135/71, 68 8/17 9:00om 166/78, 60 8/18 10:15pm 176/83, 59 8/19 11:45am 173/84, 63 8/20 3:30pm 172/84, 67 8/21 9:30am 182/98, 83 8/22 4:00pm 129/69, 64 New headache 8/23 8:35pm 162/85, 55 Dull headache 8/24     No headache 8/25 11:40pm 160/89 60 No headache

## 2014-02-27 NOTE — Telephone Encounter (Signed)
Would increase the doxazosin up to 4 mg BID Would also give hydralazine 25 mg TID PRN for BP >150 Would continue to monitor BP and heart rate at home

## 2014-03-01 MED ORDER — DOXAZOSIN MESYLATE 4 MG PO TABS: 4.0000 mg | ORAL_TABLET | Freq: Two times a day (BID) | ORAL | Status: DC

## 2014-03-01 MED ORDER — HYDRALAZINE HCL 25 MG PO TABS: 25.0000 mg | ORAL_TABLET | Freq: Three times a day (TID) | ORAL | Status: DC | PRN

## 2014-03-01 NOTE — Telephone Encounter (Signed)
Spoke w/ pt.  Advised her of Dr. Windell Hummingbird recommendation.  She is agreeable and asks that her prescription bottles be as detailed as possible. Reports that Dr. Alphonsus Sias prescribed her xanax, but she reports that it makes her dizzy, so she has not been taking it.   She will continue to monitor her BP  & HR and will call w/ numbers.  Pt states that she will let us know when she can come by to let us check her BP machine.

## 2014-03-05 ENCOUNTER — Telehealth: Payer: Self-pay

## 2014-03-05 NOTE — Telephone Encounter (Signed)
Pt called states she thinks she is having a reaction, not sure which one, sates she is taking 3, just started a new on Monday. HR is 90, BP is 112/50. Also states she has been waking up with headaches and some "swimmy headed", has had this symptom most of the summer.

## 2014-03-05 NOTE — Telephone Encounter (Signed)
If she hasn't taken hydralazine since Wed, it's unlikely that it contributed to palpitations this morning.  Her HR/BP were within an acceptable range when she checked after noon.  As her blood pressure appears to be labile, she is likely best served by continuing to take it prn as opposed to fixed dosing.  If she'd like to try a lower dose, we could call in  tid prn.

## 2014-03-05 NOTE — Telephone Encounter (Signed)
Spoke w/ pt.  Advised her of Chris's recommendation.  She verbalizes understanding and prefers to stay on the  hydralazine prn.  Asked pt to call back if we can be of further assistance.

## 2014-03-05 NOTE — Telephone Encounter (Signed)
Spoke w/ pt.  She reports that she woke up today w/ HA and her heart was pounding, so she went back to sleep. She woke up again around noon, had breakfast @ 12:30, took her pills at that time.  A 1/2 hr later, her heart was pounding again, so she took her BP: 112/52, HP 90. Pt is concerned this may be r/t hydralazine that was started this week.  Reports that she took 1 hydralazine on Tues, took 3 pills on Wed, as her BP at that time was 160/80. Reports that she is feeling fine now, w/ no HA or "wooziness". She would like to know if any changes can be made before the weekend.  Please advise.  Thank you.

## 2014-03-12 ENCOUNTER — Encounter: Payer: Self-pay | Admitting: Cardiovascular Disease

## 2014-03-12 ENCOUNTER — Ambulatory Visit (INDEPENDENT_AMBULATORY_CARE_PROVIDER_SITE_OTHER): Payer: Commercial Managed Care - HMO | Admitting: Cardiovascular Disease

## 2014-03-12 VITALS — BP 180/80 | HR 51 | Ht 60.0 in | Wt 159.0 lb

## 2014-03-12 DIAGNOSIS — E785 Hyperlipidemia, unspecified: Secondary | ICD-10-CM

## 2014-03-12 DIAGNOSIS — I10 Essential (primary) hypertension: Secondary | ICD-10-CM

## 2014-03-12 DIAGNOSIS — R0789 Other chest pain: Secondary | ICD-10-CM

## 2014-03-12 DIAGNOSIS — F411 Generalized anxiety disorder: Secondary | ICD-10-CM

## 2014-03-12 DIAGNOSIS — R42 Dizziness and giddiness: Secondary | ICD-10-CM

## 2014-03-12 MED ORDER — DOXAZOSIN MESYLATE 4 MG PO TABS: 4.0000 mg | ORAL_TABLET | Freq: Two times a day (BID) | ORAL | Status: DC

## 2014-03-12 NOTE — Assessment & Plan Note (Signed)
She is afraid of taking her Xanax. We have encouraged her to take this for hypertension in the setting of anxiety. Encouraged her to continue her walking regimen. She does go out with a friend several days per week to go shopping

## 2014-03-12 NOTE — Progress Notes (Signed)
Patient ID: Yvette Thomas, female    DOB: Mar 07, 1925, 80 y.o.   MRN: [id number]  HPI Comments: Ms. Miao is a pleasant 80 year old woman with history of hypertension, hyperlipidemia, remote smoking history for at least 20 years, urine incontinence, seen in the emergency room  10/01/2013 for abnormal EKG, malaise, who presents for routine followup. She reports having a Hiatal hernia She feels that she has a problem with anxiety  In followup today, she provides blood pressure measurements from home. In general they are better but still mildly elevated with systolic pressures in the 140-160 range. Occasional 180 and 190 systolic. She reports feeling dizzy in the head which she attributes to anxiety and her nerves. It is not vertigo She has periodically been taking hydralazine Blood pressure is elevated today, she did not take her morning medications  In the past, triamterene HCTZ was held for symptoms of incontinence, palpitations, dizziness. Calcium channel blocker caused leg swelling clonidine 0.1 mg 3 times a day caused bradycardia and the dose was decreased  Previous history of vertigo symptoms. She was previously taking  meclizine.   EKG shows normal sinus rhythm with rate 51 beats per minute, right bundle branch block    Outpatient Encounter Prescriptions as of 03/12/2014  Medication Sig  . ALPRAZolam (XANAX) 0.25 MG tablet Take 0.5-1 tablets (0.125-0.25 mg total) by mouth 2 (two) times daily as needed for anxiety.  Marland Kitchen aspirin EC 81 MG tablet Take 81 mg by mouth every other day.  . benazepril (LOTENSIN) 40 MG tablet Take 1 tablet (40 mg total) by mouth daily.  . Calcium Carbonate-Vitamin D (CALCIUM 600+D) 600-400 MG-UNIT per tablet Take 1 tablet by mouth 2 (two) times daily.    . Cholecalciferol (VITAMIN D-3 PO) Take 400 Units by mouth daily.  . cloNIDine (CATAPRES) 0.1 MG tablet Take 1 tablet (0.1 mg total) by mouth 2 (two) times daily.  Marland Kitchen doxazosin (CARDURA) 4 MG tablet Take 4 mg by  mouth daily.  . hydrALAZINE (APRESOLINE) 25 MG tablet Take 1 tablet (25 mg total) by mouth 3 (three) times daily as needed (for systolic BP over 147).  Marland Kitchen ibuprofen (ADVIL,MOTRIN) 200 MG tablet Take 200-400 mg by mouth 2 (two) times daily as needed. For arthritis pain  . Multiple Vitamins-Minerals (CENTRUM SILVER PO) Take 1 tablet by mouth daily.    . Omega-3 Fatty Acids (FISH OIL) 1000 MG CAPS Take 1 capsule by mouth daily.    Marland Kitchen omeprazole (PRILOSEC) 20 MG capsule Take 20 mg by mouth daily.  . Red Yeast Rice 600 MG CAPS Take 1 capsule by mouth daily.  . Vitamins-Lipotropics (B-50 COMPLEX) TABS Take 1 tablet by mouth daily.    . [DISCONTINUED] doxazosin (CARDURA) 4 MG tablet Take 1 tablet (4 mg total) by mouth 2 (two) times daily.    Review of Systems  Constitutional: Negative.   HENT: Negative.   Eyes: Negative.   Respiratory: Negative.   Cardiovascular: Negative.   Gastrointestinal: Negative.   Endocrine: Negative.   Genitourinary:       Incontinence  Musculoskeletal: Negative.   Skin: Negative.   Allergic/Immunologic: Negative.   Neurological: Positive for dizziness.  Hematological: Negative.   Psychiatric/Behavioral: The patient is nervous/anxious.   All other systems reviewed and are negative.   BP 180/80  Pulse 51  Ht 5' (1.524 m)  Wt 159 lb (72.122 kg)  BMI 31.05 kg/m2  Physical Exam  Nursing note and vitals reviewed. Constitutional: She is oriented to person, place, and time. She  appears well-developed and well-nourished.  HENT:  Head: Normocephalic.  Nose: Nose normal.  Mouth/Throat: Oropharynx is clear and moist.  Eyes: Conjunctivae are normal. Pupils are equal, round, and reactive to light.  Neck: Normal range of motion. Neck supple. No JVD present.  Cardiovascular: Normal rate, regular rhythm, S1 normal, S2 normal, normal heart sounds and intact distal pulses.  Exam reveals no gallop and no friction rub.   No murmur heard. 1+ pitting edema to the mid shins  bilaterally  Pulmonary/Chest: Effort normal and breath sounds normal. No respiratory distress. She has no wheezes. She has no rales. She exhibits no tenderness.  Abdominal: Soft. Bowel sounds are normal. She exhibits no distension. There is no tenderness.  Musculoskeletal: Normal range of motion. She exhibits no edema and no tenderness.  Lymphadenopathy:    She has no cervical adenopathy.  Neurological: She is alert and oriented to person, place, and time. Coordination normal.  Skin: Skin is warm and dry. No rash noted. No erythema.  Psychiatric: She has a normal mood and affect. Her behavior is normal. Judgment and thought content normal.    Assessment and Plan

## 2014-03-12 NOTE — Patient Instructions (Addendum)
You are doing well.  Please increase the dozasoxin up to 4 mg twice a day Take hydralazine for blood pressure >160  Continue to monitor your blood pressure  Please call us if you have new issues that need to be addressed before your next appt.  Your physician wants you to follow-up in: 1 month.

## 2014-03-12 NOTE — Assessment & Plan Note (Signed)
She denies having active vertigo.

## 2014-03-12 NOTE — Assessment & Plan Note (Signed)
We'll hold off on adding any cholesterol medication at this time. We'll discuss in followup

## 2014-03-12 NOTE — Assessment & Plan Note (Signed)
We have suggested she increase Cardura to 4 mg twice a day and take hydralazine only for systolic pressure greater than 160. Blood pressures have improved on her current regimen. Continues to have bradycardia but is relatively asymptomatic. In the future, will likely try to decrease the dose of her clonidine which is most likely contributing to her bradycardia

## 2014-03-22 ENCOUNTER — Other Ambulatory Visit: Payer: Self-pay | Admitting: Internal Medicine

## 2014-04-13 ENCOUNTER — Ambulatory Visit (INDEPENDENT_AMBULATORY_CARE_PROVIDER_SITE_OTHER): Payer: Commercial Managed Care - HMO | Admitting: Cardiovascular Disease

## 2014-04-13 ENCOUNTER — Encounter: Payer: Self-pay | Admitting: Cardiovascular Disease

## 2014-04-13 VITALS — BP 150/78 | HR 66 | Ht 60.0 in | Wt 156.0 lb

## 2014-04-13 DIAGNOSIS — F411 Generalized anxiety disorder: Secondary | ICD-10-CM

## 2014-04-13 DIAGNOSIS — I10 Essential (primary) hypertension: Secondary | ICD-10-CM

## 2014-04-13 DIAGNOSIS — R0789 Other chest pain: Secondary | ICD-10-CM

## 2014-04-13 DIAGNOSIS — E785 Hyperlipidemia, unspecified: Secondary | ICD-10-CM

## 2014-04-13 DIAGNOSIS — R9431 Abnormal electrocardiogram [ECG] [EKG]: Secondary | ICD-10-CM

## 2014-04-13 NOTE — Assessment & Plan Note (Signed)
No changes to her medications. Suspect anxiety is playing a role in some of her high blood pressures otherwise range is 130 up to 150 systolic, occasional 120 systolic

## 2014-04-13 NOTE — Assessment & Plan Note (Signed)
One half pill of Xanax does not seem to be helping her. She is very scared about taking the Xanax. Encouraged her to take a full pill when she has an anxiety attack

## 2014-04-13 NOTE — Assessment & Plan Note (Signed)
Currently not on a cholesterol medication. She does take red yeast rice

## 2014-04-13 NOTE — Patient Instructions (Addendum)
You are doing well. No medication changes were made.  Please call us if you have new issues that need to be addressed before your next appt.  Your physician wants you to follow-up in: 3 months.  You will receive a reminder letter in the mail two months in advance. If you don't receive a letter, please call our office to schedule the follow-up appointment.  Your next appointment will be scheduled in our new office located at :  [facility]  [address], [address]  [place], [place] [id number]

## 2014-04-13 NOTE — Progress Notes (Signed)
Patient ID: Yvette Thomas, female    DOB: 04/27/1925, 80 y.o.   MRN: [id number]  HPI Comments: Yvette Thomas is a pleasant 80 year old woman with history of hypertension, hyperlipidemia, remote smoking history for at least 20 years, urine incontinence, seen in the emergency room  10/01/2013 for abnormal EKG, malaise, who presents for routine followup. She reports having a Hiatal hernia Significant anxiety Recently given Xanax. She has only tried one half pill 4 times and it did not seem to help her much  In followup today, blood pressure seems to range between 130 up to 150, occasionally 170 when she is anxious. She is taking hydralazine when necessary for systolic pressure more than 170. She does not do any regular exercise, is relatively sedentary. Daughter presents with her today and reports she is very sedentary. Patient states she is too anxious sometimes to go out. Recent episode when she was at church and had a panic attack.  In the past, triamterene HCTZ was held for symptoms of incontinence, palpitations, dizziness. Calcium channel blocker caused leg swelling Previous history of vertigo symptoms. She was previously taking  meclizine.   EKG shows normal sinus rhythm with rate 66 beats per minute, right bundle branch block    Outpatient Encounter Prescriptions as of 04/13/2014  Medication Sig  . ALPRAZolam (XANAX) 0.25 MG tablet Take 0.5-1 tablets (0.125-0.25 mg total) by mouth 2 (two) times daily as needed for anxiety.  Marland Kitchen. aspirin EC 81 MG tablet Take 81 mg by mouth every other day.  . benazepril (LOTENSIN) 40 MG tablet Take 1 tablet (40 mg total) by mouth daily.  . Calcium Carbonate-Vitamin D (CALCIUM 600+D) 600-400 MG-UNIT per tablet Take 1 tablet by mouth 2 (two) times daily.    . Cholecalciferol (VITAMIN D-3 PO) Take 400 Units by mouth daily.  . cloNIDine (CATAPRES) 0.1 MG tablet Take 1 tablet (0.1 mg total) by mouth 2 (two) times daily.  Marland Kitchen. doxazosin (CARDURA) 4 MG tablet Take 1  tablet (4 mg total) by mouth 2 (two) times daily.  . hydrALAZINE (APRESOLINE) 25 MG tablet Take 25 mg by mouth 3 (three) times daily as needed (for systolic BP over 409160).  Marland Kitchen. ibuprofen (ADVIL,MOTRIN) 200 MG tablet Take 200-400 mg by mouth 2 (two) times daily as needed. For arthritis pain  . meclizine (ANTIVERT) 25 MG tablet TAKE ONE-HALF TO ONE TABLET BY MOUTH THREE TIMES DAILY AS NEEDED FOR DIZZINESS  . Multiple Vitamins-Minerals (CENTRUM SILVER PO) Take 1 tablet by mouth daily.    . Omega-3 Fatty Acids (FISH OIL) 1000 MG CAPS Take 1 capsule by mouth daily.    Marland Kitchen. omeprazole (PRILOSEC) 20 MG capsule Take 20 mg by mouth daily.  . Red Yeast Rice 600 MG CAPS Take 1 capsule by mouth daily.  . Vitamins-Lipotropics (B-50 COMPLEX) TABS Take 1 tablet by mouth daily.      Review of Systems  HENT: Negative.   Eyes: Negative.   Respiratory: Negative.   Cardiovascular: Negative.   Gastrointestinal: Negative.   Endocrine: Negative.   Genitourinary:       Incontinence  Musculoskeletal: Negative.   Skin: Negative.   Allergic/Immunologic: Negative.   Neurological: Positive for weakness.  Hematological: Negative.   Psychiatric/Behavioral: The patient is nervous/anxious.   All other systems reviewed and are negative.   BP 150/78  Pulse 66  Ht 5' (1.524 m)  Wt 156 lb (70.761 kg)  BMI 30.47 kg/m2  Physical Exam  Nursing note and vitals reviewed. Constitutional: She is oriented to  person, place, and time. She appears well-developed and well-nourished.  HENT:  Head: Normocephalic.  Nose: Nose normal.  Mouth/Throat: Oropharynx is clear and moist.  Eyes: Conjunctivae are normal. Pupils are equal, round, and reactive to light.  Neck: Normal range of motion. Neck supple. No JVD present.  Cardiovascular: Normal rate, regular rhythm, S1 normal, S2 normal, normal heart sounds and intact distal pulses.  Exam reveals no gallop and no friction rub.   No murmur heard. 1+ pitting edema to the mid shins  bilaterally  Pulmonary/Chest: Effort normal and breath sounds normal. No respiratory distress. She has no wheezes. She has no rales. She exhibits no tenderness.  Abdominal: Soft. Bowel sounds are normal. She exhibits no distension. There is no tenderness.  Musculoskeletal: Normal range of motion. She exhibits no edema and no tenderness.  Lymphadenopathy:    She has no cervical adenopathy.  Neurological: She is alert and oriented to person, place, and time. Coordination normal.  Skin: Skin is warm and dry. No rash noted. No erythema.  Psychiatric: She has a normal mood and affect. Her behavior is normal. Judgment and thought content normal.    Assessment and Plan

## 2014-04-27 ENCOUNTER — Encounter: Payer: Self-pay | Admitting: Internal Medicine

## 2014-04-27 ENCOUNTER — Ambulatory Visit (INDEPENDENT_AMBULATORY_CARE_PROVIDER_SITE_OTHER): Payer: Commercial Managed Care - HMO | Admitting: Internal Medicine

## 2014-04-27 VITALS — BP 138/60 | HR 63 | Temp 97.4°F | Wt 157.0 lb

## 2014-04-27 DIAGNOSIS — Z7189 Other specified counseling: Secondary | ICD-10-CM | POA: Insufficient documentation

## 2014-04-27 DIAGNOSIS — Z0000 Encounter for general adult medical examination without abnormal findings: Secondary | ICD-10-CM

## 2014-04-27 DIAGNOSIS — K219 Gastro-esophageal reflux disease without esophagitis: Secondary | ICD-10-CM

## 2014-04-27 DIAGNOSIS — E785 Hyperlipidemia, unspecified: Secondary | ICD-10-CM

## 2014-04-27 DIAGNOSIS — F39 Unspecified mood [affective] disorder: Secondary | ICD-10-CM

## 2014-04-27 DIAGNOSIS — I10 Essential (primary) hypertension: Secondary | ICD-10-CM

## 2014-04-27 DIAGNOSIS — Z23 Encounter for immunization: Secondary | ICD-10-CM

## 2014-04-27 LAB — LIPID PANEL
CHOLESTEROL: 203 mg/dL — AB (ref 0–200)
HDL: 48.8 mg/dL (ref >39.00)
LDL CALC: 134 mg/dL — AB (ref 0–99)
NONHDL: 154.2
Total CHOL/HDL Ratio: 4
Triglycerides: 103 mg/dL (ref 0.0–149.0)
VLDL: 20.6 mg/dL (ref 0.0–40.0)

## 2014-04-27 LAB — CBC WITH DIFFERENTIAL/PLATELET
BASOS PCT: 0.8 % (ref 0.0–3.0)
Basophils Absolute: 0 10*3/uL (ref 0.0–0.1)
EOS ABS: 0.3 10*3/uL (ref 0.0–0.7)
Eosinophils Relative: 5.6 % — ABNORMAL HIGH (ref 0.0–5.0)
HCT: 38.9 % (ref 36.0–46.0)
Hemoglobin: 12.9 g/dL (ref 12.0–15.0)
Lymphocytes Relative: 26.7 % (ref 12.0–46.0)
Lymphs Abs: 1.3 10*3/uL (ref 0.7–4.0)
MCHC: 33.2 g/dL (ref 30.0–36.0)
MCV: 90.7 fl (ref 78.0–100.0)
MONO ABS: 0.3 10*3/uL (ref 0.1–1.0)
Monocytes Relative: 6.8 % (ref 3.0–12.0)
Neutro Abs: 2.9 10*3/uL (ref 1.4–7.7)
Neutrophils Relative %: 60.1 % (ref 43.0–77.0)
PLATELETS: 171 10*3/uL (ref 150.0–400.0)
RBC: 4.29 Mil/uL (ref 3.87–5.11)
RDW: 13 % (ref 11.5–15.5)
WBC: 4.8 10*3/uL (ref 4.0–10.5)

## 2014-04-27 LAB — COMPREHENSIVE METABOLIC PANEL
ALBUMIN: 3.5 g/dL (ref 3.5–5.2)
ALK PHOS: 52 U/L (ref 39–117)
ALT: 12 U/L (ref 0–35)
AST: 16 U/L (ref 0–37)
BUN: 20 mg/dL (ref 6–23)
CO2: 26 mEq/L (ref 19–32)
Calcium: 9.4 mg/dL (ref 8.4–10.5)
Chloride: 108 mEq/L (ref 96–112)
Creatinine, Ser: 0.8 mg/dL (ref 0.4–1.2)
GFR: 75.23 mL/min (ref >60.00)
Glucose, Bld: 80 mg/dL (ref 70–99)
Potassium: 4.1 mEq/L (ref 3.5–5.1)
SODIUM: 139 meq/L (ref 135–145)
TOTAL PROTEIN: 6.9 g/dL (ref 6.0–8.3)
Total Bilirubin: 0.7 mg/dL (ref 0.2–1.2)

## 2014-04-27 LAB — T4, FREE: Free T4: 1.19 ng/dL (ref 0.60–1.60)

## 2014-04-27 NOTE — Progress Notes (Signed)
Pre visit review using our clinic review tool, if applicable. No additional management support is needed unless otherwise documented below in the visit note. 

## 2014-04-27 NOTE — Assessment & Plan Note (Signed)
See social history 

## 2014-04-27 NOTE — Assessment & Plan Note (Signed)
I have personally reviewed the Medicare Annual Wellness questionnaire and have noted 1. The patient's medical and social history 2. Their use of alcohol, tobacco or illicit drugs 3. Their current medications and supplements 4. The patient's functional ability including ADL's, fall risks, home safety risks and hearing or visual             impairment. 5. Diet and physical activities 6. Evidence for depression or mood disorders  The patients weight, height, BMI and visual acuity have been recorded in the chart I have made referrals, counseling and provided education to the patient based review of the above and I have provided the pt with a written personalized care plan for preventive services.  I have provided you with a copy of your personalized plan for preventive services. Please take the time to review along with your updated medication list.  Needs flu shot and prevnar No cancer screening due to age [facility]

## 2014-04-27 NOTE — Assessment & Plan Note (Signed)
BP Readings from Last 3 Encounters:  04/27/14 138/60  04/13/14 150/78  03/12/14 180/80   Good control now Some orthostasis but will continue current meds since generally tolerating

## 2014-04-27 NOTE — Patient Instructions (Signed)
Let me know if your dizziness gets worse---I would cut back some on your medications for blood pressure.

## 2014-04-27 NOTE — Progress Notes (Signed)
Subjective:    Patient ID: Yvette Thomas, female    DOB: 1924-09-17, 80 y.o.   MRN: [id number]  HPI Here for Medicare wellness and follow up Reviewed her form and advanced directives Sees Dr Mariah MillingGollan, dentist and eye doctor No falls No depression or anhedonia No hearing loss. Some vision issues--but is okay. Cataracts are worsening some No tobacco or alcohol Does try to exercise regularly Independent in instrumental ADLs Has noticed some memory issues  Still checking BP every day---urged her not to do this Generally fine--- 131/67-180/82 Headaches in AM are better now No chest pain Has vertigo intermittently---avoids the meclizine Will have mild orthostatic dizziness as well--no syncope   Hiatal hernia "giving me a fit for a while" This seems better Continues on the omeprazole  Anxiety is better Not getting the panicky symptoms Was worried about her age, increasing needs, etc No depression or anhedonia  Ringing in ears is worse Hearing is okay  Current Outpatient Prescriptions on File Prior to Visit  Medication Sig Dispense Refill  . ALPRAZolam (XANAX) 0.25 MG tablet Take 0.5-1 tablets (0.125-0.25 mg total) by mouth 2 (two) times daily as needed for anxiety.  30 tablet  0  . aspirin EC 81 MG tablet Take 81 mg by mouth every other day.      . benazepril (LOTENSIN) 40 MG tablet Take 1 tablet (40 mg total) by mouth daily.  30 tablet  6  . Calcium Carbonate-Vitamin D (CALCIUM 600+D) 600-400 MG-UNIT per tablet Take 1 tablet by mouth 2 (two) times daily.        . Cholecalciferol (VITAMIN D-3 PO) Take 400 Units by mouth daily.      . cloNIDine (CATAPRES) 0.1 MG tablet Take 1 tablet (0.1 mg total) by mouth 2 (two) times daily.  60 tablet  11  . doxazosin (CARDURA) 4 MG tablet Take 1 tablet (4 mg total) by mouth 2 (two) times daily.  60 tablet  6  . hydrALAZINE (APRESOLINE) 25 MG tablet Take 25 mg by mouth 3 (three) times daily as needed (for systolic BP over 130160).      Marland Kitchen.  ibuprofen (ADVIL,MOTRIN) 200 MG tablet Take 200-400 mg by mouth 2 (two) times daily as needed. For arthritis pain      . Multiple Vitamins-Minerals (CENTRUM SILVER PO) Take 1 tablet by mouth daily.        . Omega-3 Fatty Acids (FISH OIL) 1000 MG CAPS Take 1 capsule by mouth daily.        Marland Kitchen. omeprazole (PRILOSEC) 20 MG capsule Take 20 mg by mouth daily.      . Red Yeast Rice 600 MG CAPS Take 1 capsule by mouth daily.      . Vitamins-Lipotropics (B-50 COMPLEX) TABS Take 1 tablet by mouth daily.         No current facility-administered medications on file prior to visit.    Allergies  Allergen Reactions  . Buspirone Hcl     REACTION: Tinnitis, dizzy  . Oxybutynin Chloride     REACTION: Dried up  . Tolterodine Tartrate     REACTION: Headache    Past Medical History  Diagnosis Date  . Colon polyps   . GERD (gastroesophageal reflux disease)   . Hyperlipidemia   . Hypertension   . Hypothyroidism   . Osteoporosis   . Fibrocystic breast   . Urinary incontinence   . Allergic rhinitis, cause unspecified   . Osteoarthritis of multiple joints   . Anxiety   .  Syncope and collapse     Hx  . Pyelitis   . Vertigo     Past Surgical History  Procedure Laterality Date  . Thyroidectomy, partial  1996    Family History  Problem Relation Age of Onset  . Hypertension Sister   . Melanoma Sister   . Heart disease Mother   . Heart disease Father   . Hypertension Brother     History   Social History  . Marital Status: Widowed    Spouse Name: N/A    Number of Children: 2  . Years of Education: N/A   Occupational History  . RETIRED     [employer]   Social History Main Topics  . Smoking status: Former Smoker -- 25 years    Quit date: 07/02/1968  . Smokeless tobacco: Never Used  . Alcohol Use: No  . Drug Use: No  . Sexual Activity: Not on file   Other Topics Concern  . Not on file   Social History Narrative   Has living will    No formal health care POA but  requests daughter.   Would accept resuscitation attempts but no prolonged artificial ventilation.   No tube feedings if cognitively unaware   Review of Systems Still doesn't sleep well--- falls asleep on the couch, then hard time initiating sleep later. Appetite is good Weight stable Constipated all summer--better now but still trouble at times Voids okay--- does use pad for urge incontinence Agoraphobia in past--worried it was coming back. Has improved (just had a bad summer)     Objective:   Physical Exam  Constitutional: She is oriented to person, place, and time. She appears well-developed and well-nourished. No distress.  HENT:  Mouth/Throat: Oropharynx is clear and moist. No oropharyngeal exudate.  Neck: Normal range of motion. Neck supple. No thyromegaly present.  Cardiovascular: Normal rate, regular rhythm, normal heart sounds and intact distal pulses.  Exam reveals no gallop.   No murmur heard. Pulmonary/Chest: Effort normal and breath sounds normal. No respiratory distress. She has no wheezes. She has no rales.  Abdominal: Soft. There is no tenderness.  Musculoskeletal: She exhibits no edema and no tenderness.  Lymphadenopathy:    She has no cervical adenopathy.  Neurological: She is alert and oriented to person, place, and time.  President -- "Obama, Bush, Clinton" [phone]  I am not good at math D-[id number] Recall 3/3  Skin: No rash noted.  Psychiatric: She has a normal mood and affect. Her behavior is normal.          Assessment & Plan:

## 2014-04-27 NOTE — Assessment & Plan Note (Signed)
Anxiety is better Dysthymia at times  Hasn't been using the alprazolam

## 2014-04-27 NOTE — Assessment & Plan Note (Signed)
Controlled on the PPI 

## 2014-04-27 NOTE — Addendum Note (Signed)
Addended by: Sueanne MargaritaSMITH, DESHANNON L on: 04/27/2014 03:11 PM   Modules accepted: Orders

## 2014-04-27 NOTE — Assessment & Plan Note (Signed)
Takes red yeast rice I am not excited about statin for primary prevention at her age

## 2014-05-15 ENCOUNTER — Other Ambulatory Visit: Payer: Self-pay | Admitting: Internal Medicine

## 2014-07-16 ENCOUNTER — Encounter: Payer: Self-pay | Admitting: Cardiovascular Disease

## 2014-07-16 ENCOUNTER — Ambulatory Visit (INDEPENDENT_AMBULATORY_CARE_PROVIDER_SITE_OTHER): Payer: PPO | Admitting: Cardiovascular Disease

## 2014-07-16 VITALS — BP 130/60 | HR 56 | Ht 62.0 in | Wt 156.2 lb

## 2014-07-16 DIAGNOSIS — R9431 Abnormal electrocardiogram [ECG] [EKG]: Secondary | ICD-10-CM

## 2014-07-16 DIAGNOSIS — F39 Unspecified mood [affective] disorder: Secondary | ICD-10-CM

## 2014-07-16 DIAGNOSIS — E785 Hyperlipidemia, unspecified: Secondary | ICD-10-CM

## 2014-07-16 DIAGNOSIS — I10 Essential (primary) hypertension: Secondary | ICD-10-CM

## 2014-07-16 NOTE — Assessment & Plan Note (Signed)
Blood pressure is well controlled on today's visit. No changes made to the medications. 

## 2014-07-16 NOTE — Assessment & Plan Note (Signed)
Anxiety seems to be much better on today's visit. She is active, no complaints

## 2014-07-16 NOTE — Patient Instructions (Signed)
You are doing well. No medication changes were made.  Please call us if you have new issues that need to be addressed before your next appt.  Your physician wants you to follow-up in: 6 months.  You will receive a reminder letter in the mail two months in advance. If you don't receive a letter, please call our office to schedule the follow-up appointment.   

## 2014-07-16 NOTE — Progress Notes (Signed)
Patient ID: Yvette Thomas, female    DOB: January 30, 1925, 81 y.o.   MRN: [id number]  HPI Comments: Yvette Thomas is a pleasant 81 year old woman with history of hypertension, hyperlipidemia, remote smoking history for at least 20 years, urine incontinence, seen in the emergency room  10/01/2013 for abnormal EKG, malaise, who presents for routine followup of her hypertension. She reports having a Hiatal hernia Significant anxiety Previously given Xanax. She has only tried one half pill 4 times and it did not seem to help her much  In follow-up today, she reports that she is doing much better. She no longer checks her blood pressure at home and in fact does not even have a blood pressure cuff She is active, bothered by arthritis. No shortness of breath, no chest pain, no leg edema. Daughter reports that she is doing well She states that her weight is relatively stable. She likes to go shopping She does not feel that she is having any problems with anxiety  EKG on today's visit shows normal sinus rhythm with rate 56 bpm, right bundle branch block  Other past medical history In the past, triamterene HCTZ was held for symptoms of incontinence, palpitations, dizziness. Calcium channel blocker caused leg swelling Previous history of vertigo symptoms. She was previously taking  meclizine.    Allergies  Allergen Reactions  . Buspirone Hcl     REACTION: Tinnitis, dizzy  . Oxybutynin Chloride     REACTION: Dried up  . Tolterodine Tartrate     REACTION: Headache    Outpatient Encounter Prescriptions as of 07/16/2014  Medication Sig  . aspirin EC 81 MG tablet Take 81 mg by mouth every other day.  . benazepril (LOTENSIN) 40 MG tablet Take 1 tablet (40 mg total) by mouth daily.  . Calcium Carbonate-Vitamin D (CALCIUM 600+D) 600-400 MG-UNIT per tablet Take 1 tablet by mouth 2 (two) times daily.    . Cholecalciferol (VITAMIN D-3 PO) Take 400 Units by mouth daily.  . cloNIDine (CATAPRES) 0.1 MG tablet  Take 1 tablet (0.1 mg total) by mouth 2 (two) times daily.  Marland Kitchen. doxazosin (CARDURA) 4 MG tablet Take 1 tablet (4 mg total) by mouth 2 (two) times daily.  . hydrALAZINE (APRESOLINE) 25 MG tablet Take 25 mg by mouth 3 (three) times daily as needed (for systolic BP over 409160).  Marland Kitchen. ibuprofen (ADVIL,MOTRIN) 200 MG tablet Take 200-400 mg by mouth 2 (two) times daily as needed. For arthritis pain  . Multiple Vitamins-Minerals (CENTRUM SILVER PO) Take 1 tablet by mouth daily.    . Omega-3 Fatty Acids (FISH OIL) 1000 MG CAPS Take 1 capsule by mouth daily.    . Red Yeast Rice 600 MG CAPS Take 1 capsule by mouth daily.  . Vitamins-Lipotropics (B-50 COMPLEX) TABS Take 1 tablet by mouth daily.    . [DISCONTINUED] ALPRAZolam (XANAX) 0.25 MG tablet Take 0.5-1 tablets (0.125-0.25 mg total) by mouth 2 (two) times daily as needed for anxiety. (Patient not taking: Reported on 07/16/2014)  . [DISCONTINUED] omeprazole (PRILOSEC) 20 MG capsule TAKE ONE CAPSULE BY MOUTH TWICE DAILY (Patient not taking: Reported on 07/16/2014)    Past Medical History  Diagnosis Date  . Colon polyps   . GERD (gastroesophageal reflux disease)   . Hyperlipidemia   . Hypertension   . Hypothyroidism   . Osteoporosis   . Fibrocystic breast   . Urinary incontinence   . Allergic rhinitis, cause unspecified   . Osteoarthritis of multiple joints   . Anxiety   .  Syncope and collapse     Hx  . Pyelitis   . Vertigo     Past Surgical History  Procedure Laterality Date  . Thyroidectomy, partial  1996    Social History  reports that she quit smoking about 46 years ago. She has never used smokeless tobacco. She reports that she does not drink alcohol or use illicit drugs.  Family History family history includes Heart disease in her father and mother; Hypertension in her brother and sister; Melanoma in her sister.     Review of Systems  HENT: Negative.   Respiratory: Negative.   Cardiovascular: Negative.   Gastrointestinal:  Negative.   Genitourinary:       Incontinence  Musculoskeletal: Positive for gait problem.  Skin: Negative.   Neurological: Negative.   Hematological: Negative.   Psychiatric/Behavioral: The patient is nervous/anxious.   All other systems reviewed and are negative.   BP 130/60 mmHg  Pulse 56  Ht  (1.575 m)  Wt 156 lb 4 oz (70.875 kg)  BMI 28.57 kg/m2  Physical Exam  Constitutional: She is oriented to person, place, and time. She appears well-developed and well-nourished.  HENT:  Head: Normocephalic.  Nose: Nose normal.  Mouth/Throat: Oropharynx is clear and moist.  Eyes: Conjunctivae are normal. Pupils are equal, round, and reactive to light.  Neck: Normal range of motion. Neck supple. No JVD present.  Cardiovascular: Normal rate, regular rhythm, S1 normal, S2 normal, normal heart sounds and intact distal pulses.  Exam reveals no gallop and no friction rub.   No murmur heard. 1+ pitting edema to the mid shins bilaterally  Pulmonary/Chest: Effort normal and breath sounds normal. No respiratory distress. She has no wheezes. She has no rales. She exhibits no tenderness.  Abdominal: Soft. Bowel sounds are normal. She exhibits no distension. There is no tenderness.  Musculoskeletal: Normal range of motion. She exhibits no edema or tenderness.  Lymphadenopathy:    She has no cervical adenopathy.  Neurological: She is alert and oriented to person, place, and time. Coordination normal.  Skin: Skin is warm and dry. No rash noted. No erythema.  Psychiatric: She has a normal mood and affect. Her behavior is normal. Judgment and thought content normal.    Assessment and Plan  Nursing note and vitals reviewed.

## 2014-07-16 NOTE — Assessment & Plan Note (Signed)
There has been a slow decline in her total cholesterol level over the past 5 years. She continues on red yeast rice Total cholesterol 200

## 2014-07-27 ENCOUNTER — Other Ambulatory Visit: Payer: Self-pay | Admitting: Cardiovascular Disease

## 2014-10-18 ENCOUNTER — Telehealth: Payer: Self-pay

## 2014-10-18 NOTE — Telephone Encounter (Signed)
Pt has question regarding her medicaiton, states she has 3 BP medicines, states she experiences dizziness and anxiety when she takes them. Please call.

## 2014-10-19 NOTE — Telephone Encounter (Signed)
Spoke w/ pt. She reports that she feels she is taking too many BP meds. Reports that she takes cardura 4 mg, clonidine 0.1 mg & benazepril 40 mg in the am. States that she feels dizzy, wobbly, foggy and cannot drive in the mornings. Pt states that she suffers from mild agoraphobia, but is able to go out each day and walk.  Reports that she tried spacing her am meds out, but this did not help much and she didn't want to wait too long, as she takes 2 pills at night. Pt is going out of town next Monday and is concerned that she will have to cancel her trip.  Pt does not own a BP monitor, as she states that hers would read high, but BPs in [facility] offices would be low.  Advised her to obtain a BP monitor and check when she is symptomatic.  She would like to know if her meds can be adjusted, as she feels so bad. Please advise.  Thank you.

## 2014-10-20 NOTE — Telephone Encounter (Signed)
Would move the Cardura to the evening or before bed  We really need blood pressure measurements Unclear if she does not feel well because her blood pressure is running high or low Hard to make adjustments without numbers  We also need pulse rate  If she continues to feel poorly, potentially she could cut the clonidine in half twice a day, half in the morning, half at night Again continue to monitor blood pressure and heart rate

## 2014-10-20 NOTE — Telephone Encounter (Signed)
Spoke w/ pt.  Advised her of Dr. Windell HummingbirdGollan's recommendation.  She reports that she took her meds an hour apart and that seemed to help.  Advised her that if this is working for her, to try to do this again for a few days and see how she feels.  She reports that she already takes the Cardura BID so she will try to to cut the clonidine in 1/2 if she continues to feel bad.  Asked her to call back w/ any questions or concerns.

## 2014-10-22 ENCOUNTER — Telehealth: Payer: Self-pay | Admitting: *Deleted

## 2014-10-22 NOTE — Telephone Encounter (Signed)
Pt called stating that she was asked to record her BP readings She needs to know how many we need and how many times a day. Please advise.

## 2015-01-12 ENCOUNTER — Ambulatory Visit (INDEPENDENT_AMBULATORY_CARE_PROVIDER_SITE_OTHER): Payer: PPO | Admitting: Cardiovascular Disease

## 2015-01-12 ENCOUNTER — Encounter: Payer: Self-pay | Admitting: Cardiovascular Disease

## 2015-01-12 VITALS — BP 158/60 | HR 62 | Ht 60.0 in | Wt 160.5 lb

## 2015-01-12 DIAGNOSIS — F39 Unspecified mood [affective] disorder: Secondary | ICD-10-CM

## 2015-01-12 DIAGNOSIS — I10 Essential (primary) hypertension: Secondary | ICD-10-CM | POA: Diagnosis not present

## 2015-01-12 DIAGNOSIS — E785 Hyperlipidemia, unspecified: Secondary | ICD-10-CM | POA: Diagnosis not present

## 2015-01-12 DIAGNOSIS — F411 Generalized anxiety disorder: Secondary | ICD-10-CM

## 2015-01-12 NOTE — Patient Instructions (Signed)
You are doing well. No medication changes were made.  Please call us if you have new issues that need to be addressed before your next appt.  Your physician wants you to follow-up in: 6 months.  You will receive a reminder letter in the mail two months in advance. If you don't receive a letter, please call our office to schedule the follow-up appointment.   

## 2015-01-12 NOTE — Assessment & Plan Note (Signed)
Recommended she take her Xanax as needed for anxiety attacks Unclear if she may may benefit from SSRI. Will defer to Dr. Alphonsus SiasLetvak

## 2015-01-12 NOTE — Assessment & Plan Note (Signed)
Possible underlying depression. Fearful of going out, unsteady gait, multitude of symptoms including vertigo, shaky, anxiety, panicky

## 2015-01-12 NOTE — Assessment & Plan Note (Signed)
Blood pressure is well controlled on today's visit. No changes made to the medications. 

## 2015-01-12 NOTE — Assessment & Plan Note (Signed)
Currently taking red yeast rice 

## 2015-01-12 NOTE — Progress Notes (Signed)
Patient ID: Yvette Thomas, female    DOB: [birth year], 81 y.o.   MRN: [id number]  HPI Comments: Yvette Thomas is a pleasant 81 year old woman with history of hypertension, hyperlipidemia, remote smoking history for at least 20 years, urine incontinence, seen in the emergency room  10/01/2013 for abnormal EKG, malaise, who presents for routine followup of her hypertension. She reports having a Hiatal hernia, Significant anxiety Previously given Xanax.   In follow-up today, she continues to have problems with anxiety. This is been a chronic issue Gait instability, dizziness, possible panic attacks Blood pressure has been well-controlled with systolic pressure typically 130 up to 150, heart rate 60-70 She does not go out very much, no regular exercise. She does like to go shopping at times She's not been taking the Xanax as previously provided. Thinks it might do something to her She wonders if there is another pill for anxiety. Previously was on Prozac or Zoloft many years ago No shortness of breath, no chest pain, no leg edema.  She states that her weight is relatively stable.   EKG shows normal sinus rhythm with rate 62 bpm, right bundle branch block  Other past medical history In the past, triamterene HCTZ was held for symptoms of incontinence, palpitations, dizziness. Calcium channel blocker caused leg swelling Previous history of vertigo symptoms. She was previously taking  meclizine.    Allergies  Allergen Reactions  . Buspirone Hcl     REACTION: Tinnitis, dizzy  . Oxybutynin Chloride     REACTION: Dried up  . Tolterodine Tartrate     REACTION: Headache    Outpatient Encounter Prescriptions as of 01/12/2015  Medication Sig  . aspirin EC 81 MG tablet Take 81 mg by mouth every other day.  . benazepril (LOTENSIN) 40 MG tablet TAKE ONE TABLET BY MOUTH ONCE DAILY  . Calcium Carbonate-Vitamin D (CALCIUM 600+D) 600-400 MG-UNIT per tablet Take 1 tablet by mouth 2 (two) times daily.    .  Cholecalciferol (VITAMIN D-3 PO) Take 2 tablets by mouth daily.   . cloNIDine (CATAPRES) 0.1 MG tablet Take 1 tablet (0.1 mg total) by mouth 2 (two) times daily. (Patient taking differently: Take 0.5 mg by mouth 2 (two) times daily. )  . doxazosin (CARDURA) 4 MG tablet Take 1 tablet (4 mg total) by mouth 2 (two) times daily.  Marland Kitchen. ibuprofen (ADVIL,MOTRIN) 200 MG tablet Take 200-400 mg by mouth 2 (two) times daily as needed. For arthritis pain  . Multiple Vitamins-Minerals (CENTRUM SILVER PO) Take 1 tablet by mouth daily.    . Omega-3 Fatty Acids (FISH OIL) 1200 MG CAPS Take by mouth daily.  . Red Yeast Rice 600 MG CAPS Take 2 capsules by mouth daily.   . Vitamins-Lipotropics (B-50 COMPLEX) TABS Take 1 tablet by mouth daily.    . [DISCONTINUED] hydrALAZINE (APRESOLINE) 25 MG tablet Take 25 mg by mouth 3 (three) times daily as needed (for systolic BP over 308160).  . [DISCONTINUED] Omega-3 Fatty Acids (FISH OIL) 1000 MG CAPS Take 1 capsule by mouth daily.     No facility-administered encounter medications on file as of 01/12/2015.    Past Medical History  Diagnosis Date  . Colon polyps   . GERD (gastroesophageal reflux disease)   . Hyperlipidemia   . Hypertension   . Hypothyroidism   . Osteoporosis   . Fibrocystic breast   . Urinary incontinence   . Allergic rhinitis, cause unspecified   . Osteoarthritis of multiple joints   . Anxiety   .  Syncope and collapse     Hx  . Pyelitis   . Vertigo     Past Surgical History  Procedure Laterality Date  . Thyroidectomy, partial  1996    Social History  reports that she quit smoking about 46 years ago. She has never used smokeless tobacco. She reports that she does not drink alcohol or use illicit drugs.  Family History family history includes Heart disease in her father and mother; Hypertension in her brother and sister; Melanoma in her sister.   Review of Systems  HENT: Negative.   Respiratory: Negative.   Cardiovascular: Negative.    Gastrointestinal: Negative.   Genitourinary:       Incontinence  Musculoskeletal: Positive for gait problem.  Skin: Negative.   Neurological: Negative.   Hematological: Negative.   Psychiatric/Behavioral: The patient is nervous/anxious.   All other systems reviewed and are negative.   BP 158/60 mmHg  Pulse 62  Ht 5' (1.524 m)  Wt 160 lb 8 oz (72.802 kg)  BMI 31.35 kg/m2  Physical Exam  Constitutional: She is oriented to person, place, and time. She appears well-developed and well-nourished.  HENT:  Head: Normocephalic.  Nose: Nose normal.  Mouth/Throat: Oropharynx is clear and moist.  Eyes: Conjunctivae are normal. Pupils are equal, round, and reactive to light.  Neck: Normal range of motion. Neck supple. No JVD present.  Cardiovascular: Normal rate, regular rhythm, S1 normal, S2 normal, normal heart sounds and intact distal pulses.  Exam reveals no gallop and no friction rub.   No murmur heard. 1+ pitting edema to the mid shins bilaterally  Pulmonary/Chest: Effort normal and breath sounds normal. No respiratory distress. She has no wheezes. She has no rales. She exhibits no tenderness.  Abdominal: Soft. Bowel sounds are normal. She exhibits no distension. There is no tenderness.  Musculoskeletal: Normal range of motion. She exhibits no edema or tenderness.  Lymphadenopathy:    She has no cervical adenopathy.  Neurological: She is alert and oriented to person, place, and time. Coordination normal.  Skin: Skin is warm and dry. No rash noted. No erythema.  Psychiatric: She has a normal mood and affect. Her behavior is normal. Judgment and thought content normal.    Assessment and Plan  Nursing note and vitals reviewed.

## 2015-01-27 ENCOUNTER — Telehealth: Payer: Self-pay

## 2015-01-27 NOTE — Telephone Encounter (Signed)
I am not sure when she has even been on it---not in her chart We can discuss at her OV

## 2015-01-27 NOTE — Telephone Encounter (Signed)
Spoke with patient and she scheduled appt for 01/31/15

## 2015-01-27 NOTE — Telephone Encounter (Signed)
Pt left v/m; pt had recently seen Dr Mariah Milling and was advised to contact Dr Alphonsus Sias about starting pt on Zoloft again for anxiety; Please advise. Last annual exam on 04/27/14.[facility] [address]. Pt request cb.

## 2015-01-27 NOTE — Telephone Encounter (Signed)
I would think she would need a follow up appt to discuss starting Zoloft since she has not been seen since 04/2014

## 2015-01-31 ENCOUNTER — Ambulatory Visit (INDEPENDENT_AMBULATORY_CARE_PROVIDER_SITE_OTHER): Payer: PPO | Admitting: Internal Medicine

## 2015-01-31 ENCOUNTER — Encounter: Payer: Self-pay | Admitting: Internal Medicine

## 2015-01-31 VITALS — BP 150/80 | HR 71 | Temp 98.0°F | Wt 161.0 lb

## 2015-01-31 DIAGNOSIS — F411 Generalized anxiety disorder: Secondary | ICD-10-CM

## 2015-01-31 MED ORDER — SERTRALINE HCL 25 MG PO TABS: 25.0000 mg | ORAL_TABLET | Freq: Every day | ORAL | Status: DC

## 2015-01-31 NOTE — Progress Notes (Signed)
Pre visit review using our clinic review tool, if applicable. No additional management support is needed unless otherwise documented below in the visit note. 

## 2015-01-31 NOTE — Patient Instructions (Signed)
Please start the zoloft (sertraline) daily. Try it in the morning---if it makes you tired, take it at bedtime.

## 2015-01-31 NOTE — Progress Notes (Signed)
Subjective:    Patient ID: Yvette Thomas, female    DOB: October 23, 1924, 81 y.o.   MRN: [id number]  HPI Here with daughter  Having more trouble with her anxiety Thinks it may have been bad over the past 2 years---even though she minimized it at the last visit Reviewed Dr Windell Hummingbird note  Scared of the xanax--so not really using it Will only take a half rarely when she really has panic symptoms Has agoraphobia--- trouble at church, hair dresser's, etc Gets anxious about driving or going out Sleeps "too much"--- gets tired when she is working at home, then sits and will fall asleep Doesn't sleep well at night Alarm set at 8AM--does get up then.  Takes till noon to get "straightened out in the head" Doesn't feel depressed Not   She does all her shopping Goes out several times a week with friends (like for lunch)  Current Outpatient Prescriptions on File Prior to Visit  Medication Sig Dispense Refill  . aspirin EC 81 MG tablet Take 81 mg by mouth every other day.    . benazepril (LOTENSIN) 40 MG tablet TAKE ONE TABLET BY MOUTH ONCE DAILY 30 tablet 6  . Calcium Carbonate-Vitamin D (CALCIUM 600+D) 600-400 MG-UNIT per tablet Take 1 tablet by mouth 2 (two) times daily.      . Cholecalciferol (VITAMIN D-3 PO) Take 2 tablets by mouth daily.     Marland Kitchen doxazosin (CARDURA) 4 MG tablet Take 1 tablet (4 mg total) by mouth 2 (two) times daily. 60 tablet 6  . ibuprofen (ADVIL,MOTRIN) 200 MG tablet Take 200-400 mg by mouth 2 (two) times daily as needed. For arthritis pain    . Multiple Vitamins-Minerals (CENTRUM SILVER PO) Take 1 tablet by mouth daily.      . Omega-3 Fatty Acids (FISH OIL) 1200 MG CAPS Take by mouth daily.    . Red Yeast Rice 600 MG CAPS Take 2 capsules by mouth daily.     . Vitamins-Lipotropics (B-50 COMPLEX) TABS Take 1 tablet by mouth daily.       No current facility-administered medications on file prior to visit.    Allergies  Allergen Reactions  . Buspirone Hcl     REACTION:  Tinnitis, dizzy  . Oxybutynin Chloride     REACTION: Dried up  . Tolterodine Tartrate     REACTION: Headache    Past Medical History  Diagnosis Date  . Colon polyps   . GERD (gastroesophageal reflux disease)   . Hyperlipidemia   . Hypertension   . Hypothyroidism   . Osteoporosis   . Fibrocystic breast   . Urinary incontinence   . Allergic rhinitis, cause unspecified   . Osteoarthritis of multiple joints   . Anxiety   . Syncope and collapse     Hx  . Pyelitis   . Vertigo     Past Surgical History  Procedure Laterality Date  . Thyroidectomy, partial  1996    Family History  Problem Relation Age of Onset  . Hypertension Sister   . Melanoma Sister   . Heart disease Mother   . Heart disease Father   . Hypertension Brother     History   Social History  . Marital Status: Widowed    Spouse Name: N/A  . Number of Children: 2  . Years of Education: N/A   Occupational History  . RETIRED     [employer]   Social History Main Topics  . Smoking status: Former Smoker -- 25 years  Quit date: 07/02/1968  . Smokeless tobacco: Never Used  . Alcohol Use: No  . Drug Use: No  . Sexual Activity: Not on file   Other Topics Concern  . Not on file   Social History Narrative   Has living will    No formal health care POA but requests daughter.   Would accept resuscitation attempts but no prolonged artificial ventilation.   No tube feedings if cognitively unaware   Review of Systems Awakens with shoulder and back pain Feels wobbly when she is anxious Appetite is fine Weight is stable Still gets vertigo when standing up--or gets into bed. No nausea now. Rarely takes the meclizine    Objective:   Physical Exam  Psychiatric:  Not depressed Mildly anxious No pressured speech          Assessment & Plan:

## 2015-01-31 NOTE — Assessment & Plan Note (Signed)
Has daily symptoms Affecting her ability to go out Seems to be affecting sleep as well Discussed alternatives---will go ahead and restart sertraline at low dose (she did have this in distant past)

## 2015-02-03 ENCOUNTER — Other Ambulatory Visit: Payer: Self-pay | Admitting: Cardiovascular Disease

## 2015-02-04 ENCOUNTER — Other Ambulatory Visit: Payer: Self-pay

## 2015-02-04 MED ORDER — BENAZEPRIL HCL 40 MG PO TABS: 40.0000 mg | ORAL_TABLET | Freq: Every day | ORAL | Status: DC

## 2015-02-04 NOTE — Telephone Encounter (Signed)
Refill sent for Benazepril   

## 2015-03-08 ENCOUNTER — Ambulatory Visit (INDEPENDENT_AMBULATORY_CARE_PROVIDER_SITE_OTHER): Payer: PPO | Admitting: Internal Medicine

## 2015-03-08 ENCOUNTER — Encounter: Payer: Self-pay | Admitting: Internal Medicine

## 2015-03-08 VITALS — BP 132/70 | HR 68 | Temp 98.0°F | Wt 157.0 lb

## 2015-03-08 DIAGNOSIS — Z23 Encounter for immunization: Secondary | ICD-10-CM

## 2015-03-08 DIAGNOSIS — I10 Essential (primary) hypertension: Secondary | ICD-10-CM | POA: Diagnosis not present

## 2015-03-08 DIAGNOSIS — F411 Generalized anxiety disorder: Secondary | ICD-10-CM

## 2015-03-08 NOTE — Assessment & Plan Note (Signed)
BP Readings from Last 3 Encounters:  03/08/15 132/70  01/31/15 150/80  01/12/15 158/60   Side effects with clonidine Decreased the dose and BP lower Will stop this altogether

## 2015-03-08 NOTE — Assessment & Plan Note (Signed)
Very good response to low dose sertraline Discussed possible need to increase dose if effect wanes

## 2015-03-08 NOTE — Patient Instructions (Signed)
Stop the clonidine

## 2015-03-08 NOTE — Progress Notes (Signed)
Pre visit review using our clinic review tool, if applicable. No additional management support is needed unless otherwise documented below in the visit note. 

## 2015-03-08 NOTE — Addendum Note (Signed)
Addended by: Sueanne Margarita on: 03/08/2015 02:19 PM   Modules accepted: Orders

## 2015-03-08 NOTE — Progress Notes (Signed)
Subjective:    Patient ID: Yvette Thomas, female    DOB: 11-07-1924, 81 y.o.   MRN: [id number]  HPI Here with daughter for follow up on anxiety No problems with the medication  Took a while to "kick in"--almost a month Now feels better Has taken away the edge Able to do things easier--go out, etc  Having some dizziness Thinks it is her chronic vertigo Tries to space out meds---seems to affect her if she takes them together Has cut down on the clonidine--to 1/ 2 bid. Describes this as "fogginess"  Current Outpatient Prescriptions on File Prior to Visit  Medication Sig Dispense Refill  . aspirin EC 81 MG tablet Take 81 mg by mouth every other day.    . benazepril (LOTENSIN) 40 MG tablet Take 1 tablet (40 mg total) by mouth daily. 30 tablet 6  . Calcium Carbonate-Vitamin D (CALCIUM 600+D) 600-400 MG-UNIT per tablet Take 1 tablet by mouth 2 (two) times daily.      . Cholecalciferol (VITAMIN D-3 PO) Take 2 tablets by mouth daily.     . cloNIDine (CATAPRES) 0.1 MG tablet Take 0.05 mg by mouth 2 (two) times daily.     Marland Kitchen doxazosin (CARDURA) 4 MG tablet Take 1 tablet (4 mg total) by mouth 2 (two) times daily. 60 tablet 6  . ibuprofen (ADVIL,MOTRIN) 200 MG tablet Take 200-400 mg by mouth 2 (two) times daily as needed. For arthritis pain    . Multiple Vitamins-Minerals (CENTRUM SILVER PO) Take 1 tablet by mouth daily.      . Omega-3 Fatty Acids (FISH OIL) 1200 MG CAPS Take by mouth daily.    . Red Yeast Rice 600 MG CAPS Take 2 capsules by mouth daily.     . sertraline (ZOLOFT) 25 MG tablet Take 1 tablet (25 mg total) by mouth daily. 30 tablet 5  . Vitamins-Lipotropics (B-50 COMPLEX) TABS Take 1 tablet by mouth daily.       No current facility-administered medications on file prior to visit.    Allergies  Allergen Reactions  . Buspirone Hcl     REACTION: Tinnitis, dizzy  . Oxybutynin Chloride     REACTION: Dried up  . Tolterodine Tartrate     REACTION: Headache    Past Medical  History  Diagnosis Date  . Colon polyps   . GERD (gastroesophageal reflux disease)   . Hyperlipidemia   . Hypertension   . Hypothyroidism   . Osteoporosis   . Fibrocystic breast   . Urinary incontinence   . Allergic rhinitis, cause unspecified   . Osteoarthritis of multiple joints   . Anxiety   . Syncope and collapse     Hx  . Pyelitis   . Vertigo     Past Surgical History  Procedure Laterality Date  . Thyroidectomy, partial  1996    Family History  Problem Relation Age of Onset  . Hypertension Sister   . Melanoma Sister   . Heart disease Mother   . Heart disease Father   . Hypertension Brother     Social History   Social History  . Marital Status: Widowed    Spouse Name: N/A  . Number of Children: 2  . Years of Education: N/A   Occupational History  . RETIRED     [employer]   Social History Main Topics  . Smoking status: Former Smoker -- 25 years    Quit date: 07/02/1968  . Smokeless tobacco: Never Used  . Alcohol Use: No  .  Drug Use: No  . Sexual Activity: Not on file   Other Topics Concern  . Not on file   Social History Narrative   Has living will    No formal health care POA but requests daughter.   Would accept resuscitation attempts but no prolonged artificial ventilation.   No tube feedings if cognitively unaware   Review of Systems  Does sleep okay--intiates easily. Will fall asleep easy in day also Appetite is fine      Objective:   Physical Exam  Constitutional: She appears well-developed and well-nourished. No distress.  Cardiovascular: Normal rate, regular rhythm and normal heart sounds.  Exam reveals no gallop.   No murmur heard. Pulmonary/Chest: Effort normal and breath sounds normal. No respiratory distress. She has no wheezes. She has no rales.  Psychiatric: She has a normal mood and affect. Her behavior is normal.  Smiling and calm          Assessment & Plan:

## 2015-03-28 ENCOUNTER — Other Ambulatory Visit: Payer: Self-pay | Admitting: Cardiovascular Disease

## 2015-04-28 ENCOUNTER — Telehealth: Payer: Self-pay | Admitting: Internal Medicine

## 2015-04-28 MED ORDER — SERTRALINE HCL 50 MG PO TABS: 50.0000 mg | ORAL_TABLET | Freq: Every day | ORAL | Status: DC

## 2015-04-28 NOTE — Telephone Encounter (Signed)
Pt called - needs rx for zoloft increased (dr Alphonsus Siasletvak said he would do that)  Pharmacy is [facility] [phone]

## 2015-04-28 NOTE — Telephone Encounter (Signed)
We discussed that if her mood worsened, we would increase to 50mg  daily. Find out if she needed to do that. If so, send Rx for 50mg  #30 x 11

## 2015-04-28 NOTE — Telephone Encounter (Signed)
rx sent to pharmacy by e-script Spoke with patient and advised results   

## 2015-05-04 ENCOUNTER — Encounter: Payer: Self-pay | Admitting: Internal Medicine

## 2015-05-04 ENCOUNTER — Ambulatory Visit (INDEPENDENT_AMBULATORY_CARE_PROVIDER_SITE_OTHER): Payer: PPO | Admitting: Internal Medicine

## 2015-05-04 VITALS — BP 138/70 | HR 67 | Temp 97.6°F | Ht 60.0 in | Wt 156.0 lb

## 2015-05-04 DIAGNOSIS — R42 Dizziness and giddiness: Secondary | ICD-10-CM

## 2015-05-04 DIAGNOSIS — E785 Hyperlipidemia, unspecified: Secondary | ICD-10-CM | POA: Diagnosis not present

## 2015-05-04 DIAGNOSIS — I10 Essential (primary) hypertension: Secondary | ICD-10-CM | POA: Diagnosis not present

## 2015-05-04 DIAGNOSIS — Z0000 Encounter for general adult medical examination without abnormal findings: Secondary | ICD-10-CM | POA: Diagnosis not present

## 2015-05-04 DIAGNOSIS — F411 Generalized anxiety disorder: Secondary | ICD-10-CM | POA: Diagnosis not present

## 2015-05-04 DIAGNOSIS — Z7189 Other specified counseling: Secondary | ICD-10-CM

## 2015-05-04 LAB — LIPID PANEL
CHOL/HDL RATIO: 3
Cholesterol: 212 mg/dL — ABNORMAL HIGH (ref 0–200)
HDL: 67.1 mg/dL (ref >39.00)
LDL Cholesterol: 118 mg/dL — ABNORMAL HIGH (ref 0–99)
NONHDL: 144.74
TRIGLYCERIDES: 132 mg/dL (ref 0.0–149.0)
VLDL: 26.4 mg/dL (ref 0.0–40.0)

## 2015-05-04 LAB — CBC WITH DIFFERENTIAL/PLATELET
BASOS PCT: 0.5 % (ref 0.0–3.0)
Basophils Absolute: 0 10*3/uL (ref 0.0–0.1)
EOS PCT: 4.6 % (ref 0.0–5.0)
Eosinophils Absolute: 0.3 10*3/uL (ref 0.0–0.7)
HEMATOCRIT: 41.4 % (ref 36.0–46.0)
HEMOGLOBIN: 13.8 g/dL (ref 12.0–15.0)
LYMPHS PCT: 21.1 % (ref 12.0–46.0)
Lymphs Abs: 1.3 10*3/uL (ref 0.7–4.0)
MCHC: 33.3 g/dL (ref 30.0–36.0)
MCV: 93.1 fl (ref 78.0–100.0)
Monocytes Absolute: 0.5 10*3/uL (ref 0.1–1.0)
Monocytes Relative: 7.7 % (ref 3.0–12.0)
NEUTROS PCT: 66.1 % (ref 43.0–77.0)
Neutro Abs: 4.2 10*3/uL (ref 1.4–7.7)
PLATELETS: 176 10*3/uL (ref 150.0–400.0)
RBC: 4.45 Mil/uL (ref 3.87–5.11)
RDW: 13.3 % (ref 11.5–15.5)
WBC: 6.3 10*3/uL (ref 4.0–10.5)

## 2015-05-04 LAB — COMPREHENSIVE METABOLIC PANEL
ALT: 14 U/L (ref 0–35)
AST: 15 U/L (ref 0–37)
Albumin: 4.2 g/dL (ref 3.5–5.2)
Alkaline Phosphatase: 54 U/L (ref 39–117)
BUN: 18 mg/dL (ref 6–23)
CHLORIDE: 106 meq/L (ref 96–112)
CO2: 30 meq/L (ref 19–32)
Calcium: 10.1 mg/dL (ref 8.4–10.5)
Creatinine, Ser: 0.78 mg/dL (ref 0.40–1.20)
GFR: 73.95 mL/min (ref >60.00)
GLUCOSE: 90 mg/dL (ref 70–99)
POTASSIUM: 3.9 meq/L (ref 3.5–5.1)
SODIUM: 142 meq/L (ref 135–145)
TOTAL PROTEIN: 7.1 g/dL (ref 6.0–8.3)
Total Bilirubin: 0.6 mg/dL (ref 0.2–1.2)

## 2015-05-04 LAB — T4, FREE: Free T4: 0.99 ng/dL (ref 0.60–1.60)

## 2015-05-04 NOTE — Progress Notes (Signed)
Subjective:    Patient ID: Yvette Thomas, female    DOB: 1925/06/04, 81 y.o.   MRN: [id number]  HPI Here for Medicare wellness visit and follow up of chronic medical conditions Reviewed form and advanced directives Reviewed other doctors Here with daughter No tobacco or alcohol Rare exercise Vision generally fine--some blurriness if they are dry Hearing is okay No falls No depression or anhedonia Mild memory issues---not concerning Independent with instrumental ADLs  Still doing well with the anxiety medication Not "all scrunched up" now No longer takes the alprazolam  Still with vertigo If she gets up from low place---will have to stand still till if passes Notices if she rolls over in bed or gets up quick Medication too sedating  Still feels she is "allergic" to the BP pills Gets weak feeling and heart "flips over"--- BP also drops a lot she thinks Discussed changing the benazepril to bedtime  Feels better if she holds the doxazosin in AM--will wait to take morning meds if she is going out BP monitor broken-- may replace  Ongoing problems with neck Pain in back-- more stiff and hard to hold neck up Wearing copper necklace--may be helping some Discussed heat and extension exercises (she does this for her back already)  Still takes red yeast rice for cholesterol No myalgias or other side effects with this  Current Outpatient Prescriptions on File Prior to Visit  Medication Sig Dispense Refill  . aspirin EC 81 MG tablet Take 81 mg by mouth every other day.    . benazepril (LOTENSIN) 40 MG tablet Take 1 tablet (40 mg total) by mouth daily. 30 tablet 6  . Calcium Carbonate-Vitamin D (CALCIUM 600+D) 600-400 MG-UNIT per tablet Take 1 tablet by mouth 2 (two) times daily.      . Cholecalciferol (VITAMIN D-3 PO) Take 2 tablets by mouth daily.     Marland Kitchen. doxazosin (CARDURA) 4 MG tablet TAKE ONE TABLET BY MOUTH TWICE DAILY 60 tablet 3  . ibuprofen (ADVIL,MOTRIN) 200 MG tablet  Take 200-400 mg by mouth 2 (two) times daily as needed. For arthritis pain    . Multiple Vitamins-Minerals (CENTRUM SILVER PO) Take 1 tablet by mouth daily.      . Omega-3 Fatty Acids (FISH OIL) 1200 MG CAPS Take by mouth daily.    . Red Yeast Rice 600 MG CAPS Take 2 capsules by mouth daily.     . sertraline (ZOLOFT) 50 MG tablet Take 1 tablet (50 mg total) by mouth daily. 90 tablet 3  . Vitamins-Lipotropics (B-50 COMPLEX) TABS Take 1 tablet by mouth daily.       No current facility-administered medications on file prior to visit.    Allergies  Allergen Reactions  . Buspirone Hcl     REACTION: Tinnitis, dizzy  . Oxybutynin Chloride     REACTION: Dried up  . Tolterodine Tartrate     REACTION: Headache    Past Medical History  Diagnosis Date  . Colon polyps   . GERD (gastroesophageal reflux disease)   . Hyperlipidemia   . Hypertension   . Hypothyroidism   . Osteoporosis   . Fibrocystic breast   . Urinary incontinence   . Allergic rhinitis, cause unspecified   . Osteoarthritis of multiple joints   . Anxiety   . Syncope and collapse     Hx  . Pyelitis   . Vertigo     Past Surgical History  Procedure Laterality Date  . Thyroidectomy, partial  1996  Family History  Problem Relation Age of Onset  . Hypertension Sister   . Melanoma Sister   . Heart disease Mother   . Heart disease Father   . Hypertension Brother     Social History   Social History  . Marital Status: Widowed    Spouse Name: N/A  . Number of Children: 2  . Years of Education: N/A   Occupational History  . RETIRED     [employer]   Social History Main Topics  . Smoking status: Former Smoker -- 25 years    Quit date: 07/02/1968  . Smokeless tobacco: Never Used  . Alcohol Use: No  . Drug Use: No  . Sexual Activity: Not on file   Other Topics Concern  . Not on file   Social History Narrative   Has living will    No formal health care POA but requests daughter.   Would accept  resuscitation attempts but no prolonged artificial ventilation.   No tube feedings if cognitively unaware   Review of Systems Some dry eyes---drops OTC help Usually sleeps okay Appetite is very good Weight is stable No skin problems Bowels are generally okay-- constipation when she takes prn advil Teeth are okay--to dentist 3 times a year Wears seat belt    Objective:   Physical Exam  Constitutional: She is oriented to person, place, and time. She appears well-developed and well-nourished. No distress.  HENT:  Mouth/Throat: Oropharynx is clear and moist. No oropharyngeal exudate.  Neck: Normal range of motion. Neck supple. No thyromegaly present.  Cardiovascular: Normal rate, regular rhythm, normal heart sounds and intact distal pulses.  Exam reveals no gallop.   No murmur heard. Pulmonary/Chest: Effort normal and breath sounds normal. No respiratory distress. She has no wheezes. She has no rales.  Abdominal: Soft. There is no tenderness.  Musculoskeletal: She exhibits no edema or tenderness.  Lymphadenopathy:    She has no cervical adenopathy.  Neurological: She is alert and oriented to person, place, and time.  President-- "Obama, [place], [place]" [phone] [id number] Recall 2/3  Skin: No rash noted. No erythema.  Psychiatric: She has a normal mood and affect. Her behavior is normal.          Assessment & Plan:

## 2015-05-04 NOTE — Assessment & Plan Note (Signed)
See social history Blank forms given 

## 2015-05-04 NOTE — Assessment & Plan Note (Signed)
I have personally reviewed the Medicare Annual Wellness questionnaire and have noted 1. The patient's medical and social history 2. Their use of alcohol, tobacco or illicit drugs 3. Their current medications and supplements 4. The patient's functional ability including ADL's, fall risks, home safety risks and hearing or visual             impairment. 5. Diet and physical activities 6. Evidence for depression or mood disorders  The patients weight, height, BMI and visual acuity have been recorded in the chart I have made referrals, counseling and provided education to the patient based review of the above and I have provided the pt with a written personalized care plan for preventive services.  I have provided you with a copy of your personalized plan for preventive services. Please take the time to review along with your updated medication list.  No cancer screening due to age UTD on imms other than zostavax (which she prefers to forgo)

## 2015-05-04 NOTE — Patient Instructions (Signed)
Please change the benazapril to bedtime. If your fuzzy feeling persists, try holding the morning doxazosin and see if that helps. If it does, and your blood pressure goes up, let me know and I will try you on something else.

## 2015-05-04 NOTE — Assessment & Plan Note (Signed)
Doing okay without meds Some balance issues--discussed using cane

## 2015-05-04 NOTE — Assessment & Plan Note (Signed)
On red yeast rice. 

## 2015-05-04 NOTE — Assessment & Plan Note (Signed)
BP Readings from Last 3 Encounters:  05/04/15 138/70  03/08/15 132/70  01/31/15 150/80   Good now Will adjust BP meds to see if she feels better Consider restarting low dose amlodipine (had swelling with 5mg ) or hydralazine

## 2015-05-04 NOTE — Progress Notes (Signed)
Pre visit review using our clinic review tool, if applicable. No additional management support is needed unless otherwise documented below in the visit note. 

## 2015-05-04 NOTE — Assessment & Plan Note (Signed)
Doing much better with this Continue the medication

## 2015-07-26 ENCOUNTER — Ambulatory Visit: Payer: PPO | Admitting: Cardiovascular Disease

## 2015-08-15 ENCOUNTER — Other Ambulatory Visit: Payer: Self-pay

## 2015-08-15 MED ORDER — DOXAZOSIN MESYLATE 4 MG PO TABS: 4.0000 mg | ORAL_TABLET | Freq: Two times a day (BID) | ORAL | Status: DC

## 2015-08-15 NOTE — Telephone Encounter (Signed)
Refill sent for Doxazosin 4 mg

## 2015-09-07 ENCOUNTER — Ambulatory Visit (INDEPENDENT_AMBULATORY_CARE_PROVIDER_SITE_OTHER): Payer: PPO | Admitting: Cardiovascular Disease

## 2015-09-07 ENCOUNTER — Encounter: Payer: Self-pay | Admitting: Cardiovascular Disease

## 2015-09-07 VITALS — BP 140/60 | HR 70 | Ht 60.0 in | Wt 156.2 lb

## 2015-09-07 DIAGNOSIS — F411 Generalized anxiety disorder: Secondary | ICD-10-CM | POA: Diagnosis not present

## 2015-09-07 DIAGNOSIS — I10 Essential (primary) hypertension: Secondary | ICD-10-CM | POA: Diagnosis not present

## 2015-09-07 DIAGNOSIS — R9431 Abnormal electrocardiogram [ECG] [EKG]: Secondary | ICD-10-CM

## 2015-09-07 DIAGNOSIS — E785 Hyperlipidemia, unspecified: Secondary | ICD-10-CM | POA: Diagnosis not present

## 2015-09-07 NOTE — Assessment & Plan Note (Signed)
Significantly improved symptoms on Zoloft daily, she is very happy

## 2015-09-07 NOTE — Assessment & Plan Note (Signed)
Blood pressure is well controlled on today's visit. No changes made to the medications. 

## 2015-09-07 NOTE — Patient Instructions (Signed)
You are doing well. No medication changes were made.  Please call us if you have new issues that need to be addressed before your next appt.  Your physician wants you to follow-up in: 12 months.  You will receive a reminder letter in the mail two months in advance. If you don't receive a letter, please call our office to schedule the follow-up appointment. 

## 2015-09-07 NOTE — Progress Notes (Signed)
Patient ID: Yvette Thomas, female    DOB: 06-Feb-1925, 82 y.o.   MRN: [id number]  HPI Comments: Yvette Thomas is a pleasant 82 year old woman with history of hypertension, hyperlipidemia, remote smoking history for at least 20 years, urine incontinence, seen in the emergency room  10/01/2013 for abnormal EKG, malaise, who presents for routine followup of her hypertension. She reports having a Hiatal hernia, Significant anxiety Previously given Xanax.   In follow-up today, she reports that she is doing well Recently started on Zoloft, reports this has made a world of difference Dramatic improvement in her anxiety, panic attacks, vertigo, tinnitus Still with balance issues. She is sedentary, no regular exercise program, does not like to walk with a cane  Denies any recent falls Overall no new complaints  Lab work reviewed showing total cholesterol 212, LDL 118 She reports that she eats lots of candy, lives alone, no willpower  EKG on today's visit shows normal sinus rhythm with rate 63 bpm, right bundle branch block  Other past medical history In the past, triamterene HCTZ was held for symptoms of incontinence, palpitations, dizziness. Calcium channel blocker caused leg swelling Previous history of vertigo symptoms. She was previously taking  meclizine.    Allergies  Allergen Reactions  . Buspirone Hcl     REACTION: Tinnitis, dizzy  . Oxybutynin Chloride     REACTION: Dried up  . Tolterodine Tartrate     REACTION: Headache    Outpatient Encounter Prescriptions as of 09/07/2015  Medication Sig  . aspirin EC 81 MG tablet Take 81 mg by mouth every other day.  . benazepril (LOTENSIN) 40 MG tablet Take 1 tablet (40 mg total) by mouth daily.  . Calcium Carbonate-Vitamin D (CALCIUM 600+D) 600-400 MG-UNIT per tablet Take 1 tablet by mouth 2 (two) times daily.    . Cholecalciferol (VITAMIN D-3 PO) Take 2 tablets by mouth daily.   Marland Kitchen doxazosin (CARDURA) 4 MG tablet Take 1 tablet (4 mg total) by  mouth 2 (two) times daily.  Marland Kitchen ibuprofen (ADVIL,MOTRIN) 200 MG tablet Take 200-400 mg by mouth 2 (two) times daily as needed. For arthritis pain  . Multiple Vitamins-Minerals (CENTRUM SILVER PO) Take 1 tablet by mouth daily.    . Omega-3 Fatty Acids (FISH OIL) 1200 MG CAPS Take by mouth daily.  . Red Yeast Rice 600 MG CAPS Take 2 capsules by mouth daily.   . sertraline (ZOLOFT) 50 MG tablet Take 1 tablet (50 mg total) by mouth daily.  . Vitamins-Lipotropics (B-50 COMPLEX) TABS Take 1 tablet by mouth daily.     No facility-administered encounter medications on file as of 09/07/2015.    Past Medical History  Diagnosis Date  . Colon polyps   . GERD (gastroesophageal reflux disease)   . Hyperlipidemia   . Hypertension   . Hypothyroidism   . Osteoporosis   . Fibrocystic breast   . Urinary incontinence   . Allergic rhinitis, cause unspecified   . Osteoarthritis of multiple joints   . Anxiety   . Syncope and collapse     Hx  . Pyelitis   . Vertigo     Past Surgical History  Procedure Laterality Date  . Thyroidectomy, partial  1996    Social History  reports that she quit smoking about 80 years ago. She has never used smokeless tobacco. She reports that she does not drink alcohol or use illicit drugs.  Family History family history includes Heart disease in her father and mother; Hypertension in her brother  and sister; Melanoma in her sister.   Review of Systems  HENT: Negative.   Respiratory: Negative.   Cardiovascular: Negative.   Gastrointestinal: Negative.   Genitourinary:       Incontinence  Musculoskeletal: Positive for gait problem.  Skin: Negative.   Neurological: Negative.   Hematological: Negative.   Psychiatric/Behavioral: The patient is nervous/anxious.   All other systems reviewed and are negative.   BP 140/60 mmHg  Pulse 70  Ht 5' (1.524 m)  Wt 156 lb 4 oz (70.875 kg)  BMI 30.52 kg/m2  Physical Exam  Constitutional: She is oriented to person, place,  and time. She appears well-developed and well-nourished.  HENT:  Head: Normocephalic.  Nose: Nose normal.  Mouth/Throat: Oropharynx is clear and moist.  Eyes: Conjunctivae are normal. Pupils are equal, round, and reactive to light.  Neck: Normal range of motion. Neck supple. No JVD present.  Cardiovascular: Normal rate, regular rhythm, S1 normal, S2 normal, normal heart sounds and intact distal pulses.  Exam reveals no gallop and no friction rub.   No murmur heard. 1+ pitting edema to the mid shins bilaterally  Pulmonary/Chest: Effort normal and breath sounds normal. No respiratory distress. She has no wheezes. She has no rales. She exhibits no tenderness.  Abdominal: Soft. Bowel sounds are normal. She exhibits no distension. There is no tenderness.  Musculoskeletal: Normal range of motion. She exhibits no edema or tenderness.  Lymphadenopathy:    She has no cervical adenopathy.  Neurological: She is alert and oriented to person, place, and time. Coordination normal.  Skin: Skin is warm and dry. No rash noted. No erythema.  Psychiatric: She has a normal mood and affect. Her behavior is normal. Judgment and thought content normal.    Assessment and Plan  Nursing note and vitals reviewed.

## 2015-09-07 NOTE — Assessment & Plan Note (Signed)
Tolerating red yeast rice Poor diet, eats lots of candy Recommended a strict diet for weight loss, also recommended daily walking program

## 2015-09-18 ENCOUNTER — Other Ambulatory Visit: Payer: Self-pay | Admitting: Cardiovascular Disease

## 2015-11-16 ENCOUNTER — Encounter: Payer: Self-pay | Admitting: Internal Medicine

## 2015-11-16 ENCOUNTER — Ambulatory Visit (INDEPENDENT_AMBULATORY_CARE_PROVIDER_SITE_OTHER): Payer: PPO | Admitting: Internal Medicine

## 2015-11-16 VITALS — BP 138/74 | HR 72 | Temp 97.5°F | Wt 151.0 lb

## 2015-11-16 DIAGNOSIS — F39 Unspecified mood [affective] disorder: Secondary | ICD-10-CM | POA: Diagnosis not present

## 2015-11-16 DIAGNOSIS — I10 Essential (primary) hypertension: Secondary | ICD-10-CM | POA: Diagnosis not present

## 2015-11-16 DIAGNOSIS — K219 Gastro-esophageal reflux disease without esophagitis: Secondary | ICD-10-CM

## 2015-11-16 DIAGNOSIS — M419 Scoliosis, unspecified: Secondary | ICD-10-CM | POA: Insufficient documentation

## 2015-11-16 NOTE — Assessment & Plan Note (Signed)
This is better No meds regularly now

## 2015-11-16 NOTE — Assessment & Plan Note (Signed)
Probably part of balance issues---neck is okay though Continue exercises Dicussed meds

## 2015-11-16 NOTE — Assessment & Plan Note (Signed)
BP Readings from Last 3 Encounters:  11/16/15 138/74  09/07/15 140/60  05/04/15 138/70   Good control No change needed

## 2015-11-16 NOTE — Assessment & Plan Note (Signed)
Doing well with the sertraline

## 2015-11-16 NOTE — Progress Notes (Signed)
Subjective:    Patient ID: Yvette Thomas, female    DOB: 1925/02/14, 82 y.o.   MRN: [id number]  HPI Here for follow up of chronic health conditions  Missed our driveway and went past [place] Gets lost at times Only drives locally She does note some memory issues in general Still does all shopping, instrumental ADLs and pays bills (though she procrastinates)  Does exercise for neck and shoulders Still finds her head leaning forward Occasionally takes 1 advil-- this helps some (2-3 per day is the most) Does use heat--that helps  Still very happy with the sertraline This keeps her anxiety controlled---even tolerated sister's funeral well  BP has been doing fine No chest pain No SOB No dizziness  Hiatal hernia seems one No heartburn or swallowing problems lately (or just rare reflux)--rare tums  Current Outpatient Prescriptions on File Prior to Visit  Medication Sig Dispense Refill  . aspirin EC 81 MG tablet Take 81 mg by mouth every other day.    . benazepril (LOTENSIN) 40 MG tablet TAKE ONE TABLET BY MOUTH ONCE DAILY 30 tablet 6  . Calcium Carbonate-Vitamin D (CALCIUM 600+D) 600-400 MG-UNIT per tablet Take 1 tablet by mouth 2 (two) times daily.      . Cholecalciferol (VITAMIN D-3 PO) Take 2 tablets by mouth daily.     Marland Kitchen doxazosin (CARDURA) 4 MG tablet Take 1 tablet (4 mg total) by mouth 2 (two) times daily. 60 tablet 3  . ibuprofen (ADVIL,MOTRIN) 200 MG tablet Take 200-400 mg by mouth 2 (two) times daily as needed. For arthritis pain    . Multiple Vitamins-Minerals (CENTRUM SILVER PO) Take 1 tablet by mouth daily.      . Omega-3 Fatty Acids (FISH OIL) 1200 MG CAPS Take by mouth daily.    . Red Yeast Rice 600 MG CAPS Take 2 capsules by mouth daily.     . sertraline (ZOLOFT) 50 MG tablet Take 1 tablet (50 mg total) by mouth daily. 90 tablet 3  . Vitamins-Lipotropics (B-50 COMPLEX) TABS Take 1 tablet by mouth daily.       No current facility-administered medications on file  prior to visit.    Allergies  Allergen Reactions  . Buspirone Hcl     REACTION: Tinnitis, dizzy  . Oxybutynin Chloride     REACTION: Dried up  . Tolterodine Tartrate     REACTION: Headache    Past Medical History  Diagnosis Date  . Colon polyps   . GERD (gastroesophageal reflux disease)   . Hyperlipidemia   . Hypertension   . Hypothyroidism   . Osteoporosis   . Fibrocystic breast   . Urinary incontinence   . Allergic rhinitis, cause unspecified   . Osteoarthritis of multiple joints   . Anxiety   . Syncope and collapse     Hx  . Pyelitis   . Vertigo     Past Surgical History  Procedure Laterality Date  . Thyroidectomy, partial  1996    Family History  Problem Relation Age of Onset  . Hypertension Sister   . Melanoma Sister   . Heart disease Mother   . Heart disease Father   . Hypertension Brother     Social History   Social History  . Marital Status: Widowed    Spouse Name: N/A  . Number of Children: 2  . Years of Education: N/A   Occupational History  . RETIRED     [employer]   Social History Main Topics  .  Smoking status: Former Smoker -- 25 years    Quit date: 07/02/1968  . Smokeless tobacco: Never Used  . Alcohol Use: No  . Drug Use: No  . Sexual Activity: Not on file   Other Topics Concern  . Not on file   Social History Narrative   Has living will    No formal health care POA but requests daughter.   Would accept resuscitation attempts but no prolonged artificial ventilation.   No tube feedings if cognitively unaware   Review of Systems Still a restless sleeper Appetite is fine Has lost a couple of pounds--despite regular sweets Having watery stools--- goes back 3 months. Goes 3-4 times a day. Uses imodium every 3-4 days Has some balance issues--cane helped but she didn't like it    Objective:   Physical Exam  Constitutional: She appears well-developed. No distress.  Neck:  Fair ROM but some flexion due to back    Cardiovascular: Normal rate, regular rhythm and normal heart sounds.  Exam reveals no gallop.   No murmur heard. Pulmonary/Chest: Effort normal and breath sounds normal. No respiratory distress. She has no wheezes. She has no rales.  Abdominal: Soft. There is no tenderness.  Musculoskeletal: She exhibits no edema.  Sig kyphoscoliosis--curve in thorax to left  Psychiatric: She has a normal mood and affect. Her behavior is normal.          Assessment & Plan:

## 2015-11-16 NOTE — Patient Instructions (Signed)
Please keep exercising your neck and use heat. You can use tylenol 650mg  up to three times daily for pain--if that doesn't work, it is okay to use ibuprofen 200mg  up to three times daily. Use a cane!!!!

## 2015-11-16 NOTE — Progress Notes (Signed)
Pre visit review using our clinic review tool, if applicable. No additional management support is needed unless otherwise documented below in the visit note. 

## 2015-12-22 ENCOUNTER — Other Ambulatory Visit: Payer: Self-pay | Admitting: Cardiovascular Disease

## 2016-04-18 DIAGNOSIS — H2513 Age-related nuclear cataract, bilateral: Secondary | ICD-10-CM | POA: Diagnosis not present

## 2016-05-02 ENCOUNTER — Other Ambulatory Visit: Payer: Self-pay | Admitting: Internal Medicine

## 2016-05-08 ENCOUNTER — Ambulatory Visit: Payer: Self-pay | Admitting: Internal Medicine

## 2016-05-09 ENCOUNTER — Ambulatory Visit (INDEPENDENT_AMBULATORY_CARE_PROVIDER_SITE_OTHER): Payer: PPO | Admitting: Internal Medicine

## 2016-05-09 ENCOUNTER — Encounter: Payer: Self-pay | Admitting: Internal Medicine

## 2016-05-09 VITALS — BP 132/70 | HR 66 | Temp 97.3°F | Wt 146.0 lb

## 2016-05-09 DIAGNOSIS — Z7189 Other specified counseling: Secondary | ICD-10-CM

## 2016-05-09 DIAGNOSIS — Z23 Encounter for immunization: Secondary | ICD-10-CM

## 2016-05-09 DIAGNOSIS — I10 Essential (primary) hypertension: Secondary | ICD-10-CM

## 2016-05-09 DIAGNOSIS — Z0000 Encounter for general adult medical examination without abnormal findings: Secondary | ICD-10-CM

## 2016-05-09 DIAGNOSIS — F39 Unspecified mood [affective] disorder: Secondary | ICD-10-CM | POA: Diagnosis not present

## 2016-05-09 DIAGNOSIS — M419 Scoliosis, unspecified: Secondary | ICD-10-CM | POA: Diagnosis not present

## 2016-05-09 LAB — CBC WITH DIFFERENTIAL/PLATELET
Basophils Absolute: 0 10*3/uL (ref 0.0–0.1)
Basophils Relative: 0.6 % (ref 0.0–3.0)
EOS PCT: 5.4 % — AB (ref 0.0–5.0)
Eosinophils Absolute: 0.3 10*3/uL (ref 0.0–0.7)
HEMATOCRIT: 36.9 % (ref 36.0–46.0)
HEMOGLOBIN: 12.6 g/dL (ref 12.0–15.0)
LYMPHS PCT: 20.4 % (ref 12.0–46.0)
Lymphs Abs: 1.3 10*3/uL (ref 0.7–4.0)
MCHC: 34 g/dL (ref 30.0–36.0)
MCV: 91.4 fl (ref 78.0–100.0)
MONOS PCT: 6.4 % (ref 3.0–12.0)
Monocytes Absolute: 0.4 10*3/uL (ref 0.1–1.0)
Neutro Abs: 4.3 10*3/uL (ref 1.4–7.7)
Neutrophils Relative %: 67.2 % (ref 43.0–77.0)
Platelets: 158 10*3/uL (ref 150.0–400.0)
RBC: 4.03 Mil/uL (ref 3.87–5.11)
RDW: 13 % (ref 11.5–15.5)
WBC: 6.4 10*3/uL (ref 4.0–10.5)

## 2016-05-09 LAB — COMPREHENSIVE METABOLIC PANEL
ALBUMIN: 4.2 g/dL (ref 3.5–5.2)
ALK PHOS: 46 U/L (ref 39–117)
ALT: 14 U/L (ref 0–35)
AST: 14 U/L (ref 0–37)
BUN: 19 mg/dL (ref 6–23)
CALCIUM: 9.5 mg/dL (ref 8.4–10.5)
CO2: 28 mEq/L (ref 19–32)
Chloride: 110 mEq/L (ref 96–112)
Creatinine, Ser: 0.82 mg/dL (ref 0.40–1.20)
GFR: 69.64 mL/min (ref >60.00)
Glucose, Bld: 86 mg/dL (ref 70–99)
POTASSIUM: 3.6 meq/L (ref 3.5–5.1)
Sodium: 143 mEq/L (ref 135–145)
TOTAL PROTEIN: 6.8 g/dL (ref 6.0–8.3)
Total Bilirubin: 0.4 mg/dL (ref 0.2–1.2)

## 2016-05-09 LAB — T4, FREE: FREE T4: 0.84 ng/dL (ref 0.60–1.60)

## 2016-05-09 MED ORDER — DOXAZOSIN MESYLATE 2 MG PO TABS: 2.0000 mg | ORAL_TABLET | Freq: Every day | ORAL | 11 refills | Status: DC

## 2016-05-09 NOTE — Assessment & Plan Note (Addendum)
Probably cause of neck pain, and some of balance problems, etc 2 falls Will set up with PT  Handicapped form done--[place] [facility] [place] [employer]

## 2016-05-09 NOTE — Progress Notes (Signed)
Subjective:    Patient ID: Yvette Thomas, female    DOB: [birth year], 82 y.o.   MRN: [id number]  HPI Here with daughter for Medicare wellness and  follow up of chronic medical conditions Reviewed form and advanced directives Reviewed other doctors No tobacco or alcohol Tries to exercise Vision is affected by cataracts--getting surgery on right later this month Hearing is okay--but needs to turn up sound on radio, etc Concerned about ongoing memory issues. Still does bills, drives, does all instrumental ADLs 2 falls in the past year--mild bruises   Ongoing neck pain Gets headache after being in bed Wonders about getting therapy--- chiropractor didn't work out Burning hurt--- advil does help some  Still on BP meds Will occasionally feel "foggy" after meds No chest pain No SOB occ "flip" of the heart Thinks dizziness may be related to her meds  Watery stools still every day 2-3 times a day Uses imodium intermittently--- like once a week No blood in stools Occasional fecal incontinence  Mood not great ---states "I am a little weary" Feels the sertraline is working No regular depression or anhedonia  Current Outpatient Prescriptions on File Prior to Visit  Medication Sig Dispense Refill  . aspirin EC 81 MG tablet Take 81 mg by mouth every other day.    . benazepril (LOTENSIN) 40 MG tablet TAKE ONE TABLET BY MOUTH ONCE DAILY 30 tablet 6  . Calcium Carbonate-Vitamin D (CALCIUM 600+D) 600-400 MG-UNIT per tablet Take 1 tablet by mouth 2 (two) times daily.      . Cholecalciferol (VITAMIN D-3 PO) Take 2 tablets by mouth daily.     Marland Kitchen. ibuprofen (ADVIL,MOTRIN) 200 MG tablet Take 200-400 mg by mouth 2 (two) times daily as needed. For arthritis pain    . Multiple Vitamins-Minerals (CENTRUM SILVER PO) Take 1 tablet by mouth daily.      . Omega-3 Fatty Acids (FISH OIL) 1200 MG CAPS Take by mouth daily.    . Red Yeast Rice 600 MG CAPS Take 2 capsules by mouth daily.     . sertraline  (ZOLOFT) 50 MG tablet TAKE ONE TABLET BY MOUTH ONCE DAILY 90 tablet 0  . Vitamins-Lipotropics (B-50 COMPLEX) TABS Take 1 tablet by mouth daily.      . [DISCONTINUED] doxazosin (CARDURA) 4 MG tablet TAKE ONE TABLET BY MOUTH TWICE DAILY 60 tablet 6   No current facility-administered medications on file prior to visit.     Allergies  Allergen Reactions  . Buspirone Hcl     REACTION: Tinnitis, dizzy  . Oxybutynin Chloride     REACTION: Dried up  . Tolterodine Tartrate     REACTION: Headache    Past Medical History:  Diagnosis Date  . Allergic rhinitis, cause unspecified   . Anxiety   . Colon polyps   . Fibrocystic breast   . GERD (gastroesophageal reflux disease)   . Hyperlipidemia   . Hypertension   . Hypothyroidism   . Osteoarthritis of multiple joints   . Osteoporosis   . Pyelitis   . Syncope and collapse    Hx  . Urinary incontinence   . Vertigo     Past Surgical History:  Procedure Laterality Date  . THYROIDECTOMY, PARTIAL  1996    Family History  Problem Relation Age of Onset  . Hypertension Sister   . Melanoma Sister   . Heart disease Mother   . Heart disease Father   . Hypertension Brother     Social History   Social  History  . Marital status: Widowed    Spouse name: N/A  . Number of children: 2  . Years of education: N/A   Occupational History  . RETIRED     [employer]   Social History Main Topics  . Smoking status: Former Smoker    Years: 25.00    Quit date: 07/02/1968  . Smokeless tobacco: Never Used  . Alcohol use No  . Drug use: No  . Sexual activity: Not on file   Other Topics Concern  . Not on file   Social History Narrative   Has living will    Daughter is health care POA.   Would accept resuscitation attempts but no prolonged artificial ventilation.   No tube feedings if cognitively unaware   Review of Systems Appetite is fine Weight is down just a bit--trying to Sleep is still not great--up and down all night.  Will go out and sleep on sofa Sleeps in day also No rash or suspicious skin lesions    Objective:   Physical Exam  Constitutional: She is oriented to person, place, and time. She appears well-developed and well-nourished. No distress.  HENT:  Mouth/Throat: Oropharynx is clear and moist. No oropharyngeal exudate.  Neck: Normal range of motion. Neck supple. No thyromegaly present.  Cardiovascular: Normal rate, regular rhythm, normal heart sounds and intact distal pulses.  Exam reveals no gallop.   No murmur heard. Pulmonary/Chest: Effort normal and breath sounds normal. No respiratory distress. She has no wheezes. She has no rales.  Abdominal: Soft. There is no tenderness.  Musculoskeletal: She exhibits no edema or tenderness.  Kyphoscoliosis  Shoulders asymmetric  Lymphadenopathy:    She has no cervical adenopathy.  Neurological: She is alert and oriented to person, place, and time.  "President-- "Trump, Obama, Bush" 100-  "I won't do that" D-l-r-[id number]-w Recall 3/3  Skin: No rash noted. No erythema.  Psychiatric: She has a normal mood and affect. Her behavior is normal.          Assessment & Plan:

## 2016-05-09 NOTE — Assessment & Plan Note (Signed)
Chronic anxiety No major depression Does okay with the med

## 2016-05-09 NOTE — Assessment & Plan Note (Signed)
BP Readings from Last 3 Encounters:  05/09/16 132/70  11/16/15 138/74  09/07/15 140/60   Dizzy fairly commonly Will try decreasing the doxazosin

## 2016-05-09 NOTE — Patient Instructions (Signed)
Please cut the doxazosin to 2mg  twice a day---cut the 4mg  dose in half till they run out, then fill the new prescription for the lower dose.

## 2016-05-09 NOTE — Progress Notes (Signed)
Pre visit review using our clinic review tool, if applicable. No additional management support is needed unless otherwise documented below in the visit note. 

## 2016-05-09 NOTE — Addendum Note (Signed)
Addended by: Eual FinesBRIDGES, Tinika Bucknam P on: 05/09/2016 01:39 PM   Modules accepted: Orders

## 2016-05-09 NOTE — Assessment & Plan Note (Signed)
See social history 

## 2016-05-09 NOTE — Assessment & Plan Note (Signed)
I have personally reviewed the Medicare Annual Wellness questionnaire and have noted 1. The patient's medical and social history 2. Their use of alcohol, tobacco or illicit drugs 3. Their current medications and supplements 4. The patient's functional ability including ADL's, fall risks, home safety risks and hearing or visual             impairment. 5. Diet and physical activities 6. Evidence for depression or mood disorders  The patients weight, height, BMI and visual acuity have been recorded in the chart I have made referrals, counseling and provided education to the patient based review of the above and I have provided the pt with a written personalized care plan for preventive services.  I have provided you with a copy of your personalized plan for preventive services. Please take the time to review along with your updated medication list.  Flu vaccine No cancer screening due to age Discussed exercise

## 2016-05-15 DIAGNOSIS — H2513 Age-related nuclear cataract, bilateral: Secondary | ICD-10-CM | POA: Diagnosis not present

## 2016-05-17 ENCOUNTER — Encounter: Payer: Self-pay | Admitting: *Deleted

## 2016-05-21 DIAGNOSIS — M545 Low back pain: Secondary | ICD-10-CM | POA: Diagnosis not present

## 2016-05-22 ENCOUNTER — Ambulatory Visit: Payer: Self-pay | Admitting: Internal Medicine

## 2016-05-23 NOTE — Discharge Instructions (Signed)
Cataract Surgery, Care After °Refer to this sheet in the next few weeks. These instructions provide you with information about caring for yourself after your procedure. Your health care provider may also give you more specific instructions. Your treatment has been planned according to current medical practices, but problems sometimes occur. Call your health care provider if you have any problems or questions after your procedure. °What can I expect after the procedure? °After the procedure, it is common to have: °· Itching. °· Discomfort. °· Fluid discharge. °· Sensitivity to light and to touch. °· Bruising. °Follow these instructions at home: °Eye Care  °· Check your eye every day for signs of infection. Watch for: °¨ Redness, swelling, or pain. °¨ Fluid, blood, or pus. °¨ Warmth. °¨ Bad smell. °Activity  °· Avoid strenuous activities, such as playing contact sports, for as long as told by your health care provider. °· Do not drive or operate heavy machinery until your health care provider approves. °· Do not bend or lift heavy objects . Bending increases pressure in the eye. You can walk, climb stairs, and do light household chores. °· Ask your health care provider when you can return to work. If you work in a dusty environment, you may be advised to wear protective eyewear for a period of time. °General instructions  °· Take or apply over-the-counter and prescription medicines only as told by your health care provider. This includes eye drops. °· Do not touch or rub your eyes. °· If you were given a protective shield, wear it as told by your health care provider. If you were not given a protective shield, wear sunglasses as told by your health care provider to protect your eyes. °· Keep the area around your eye clean and dry. Avoid swimming or allowing water to hit you directly in the face while showering until told by your health care provider. Keep soap and shampoo out of your eyes. °· Do not put a contact lens  into the affected eye or eyes until your health care provider approves. °· Keep all follow-up visits as told by your health care provider. This is important. °Contact a health care provider if: ° °· You have increased bruising around your eye. °· You have pain that is not helped with medicine. °· You have a fever. °· You have redness, swelling, or pain in your eye. °· You have fluid, blood, or pus coming from your incision. °· Your vision gets worse. °Get help right away if: °· You have sudden vision loss. °This information is not intended to replace advice given to you by your health care provider. Make sure you discuss any questions you have with your health care provider. °Document Released: 01/05/2005 Document Revised: 10/27/2015 Document Reviewed: 04/28/2015 °Elsevier Interactive Patient Education © 2017 [employer] ° ° ° ° °General Anesthesia, Adult, Care After °These instructions provide you with information about caring for yourself after your procedure. Your health care provider may also give you more specific instructions. Your treatment has been planned according to current medical practices, but problems sometimes occur. Call your health care provider if you have any problems or questions after your procedure. °What can I expect after the procedure? °After the procedure, it is common to have: °· Vomiting. °· A sore throat. °· Mental slowness. °It is common to feel: °· Nauseous. °· Cold or shivery. °· Sleepy. °· Tired. °· Sore or achy, even in parts of your body where you did not have surgery. °Follow these instructions at   home: °For at least 24 hours after the procedure:  °· Do not: °¨ Participate in activities where you could fall or become injured. °¨ Drive. °¨ Use heavy machinery. °¨ Drink alcohol. °¨ Take sleeping pills or medicines that cause drowsiness. °¨ Make important decisions or sign legal documents. °¨ Take care of children on your own. °· Rest. °Eating and drinking  °· If you vomit, drink  water, juice, or soup when you can drink without vomiting. °· Drink enough fluid to keep your urine clear or pale yellow. °· Make sure you have little or no nausea before eating solid foods. °· Follow the diet recommended by your health care provider. °General instructions  °· Have a responsible adult stay with you until you are awake and alert. °· Return to your normal activities as told by your health care provider. Ask your health care provider what activities are safe for you. °· Take over-the-counter and prescription medicines only as told by your health care provider. °· If you smoke, do not smoke without supervision. °· Keep all follow-up visits as told by your health care provider. This is important. °Contact a health care provider if: °· You continue to have nausea or vomiting at home, and medicines are not helpful. °· You cannot drink fluids or start eating again. °· You cannot urinate after 8-12 hours. °· You develop a skin rash. °· You have fever. °· You have increasing redness at the site of your procedure. °Get help right away if: °· You have difficulty breathing. °· You have chest pain. °· You have unexpected bleeding. °· You feel that you are having a life-threatening or urgent problem. °This information is not intended to replace advice given to you by your health care provider. Make sure you discuss any questions you have with your health care provider. °Document Released: 09/24/2000 Document Revised: 11/21/2015 Document Reviewed: 06/02/2015 °Elsevier Interactive Patient Education © 2017 [employer] ° °

## 2016-05-28 ENCOUNTER — Encounter
Admission: RE | Disposition: A | Discharge status: Home / Self Care | Payer: Self-pay | Source: Ambulatory Visit | Attending: Ophthalmology

## 2016-05-28 ENCOUNTER — Ambulatory Visit: Payer: PPO | Admitting: Anesthesiology

## 2016-05-28 ENCOUNTER — Ambulatory Visit
Admission: RE | Admit: 2016-05-28 | Discharge: 2016-05-28 | Disposition: A | Discharge status: Home / Self Care | Payer: PPO | Source: Ambulatory Visit | Attending: Ophthalmology | Admitting: Ophthalmology

## 2016-05-28 DIAGNOSIS — K219 Gastro-esophageal reflux disease without esophagitis: Secondary | ICD-10-CM | POA: Diagnosis not present

## 2016-05-28 DIAGNOSIS — N289 Disorder of kidney and ureter, unspecified: Secondary | ICD-10-CM | POA: Insufficient documentation

## 2016-05-28 DIAGNOSIS — H2511 Age-related nuclear cataract, right eye: Secondary | ICD-10-CM | POA: Diagnosis not present

## 2016-05-28 DIAGNOSIS — I10 Essential (primary) hypertension: Secondary | ICD-10-CM | POA: Diagnosis not present

## 2016-05-28 DIAGNOSIS — H2513 Age-related nuclear cataract, bilateral: Secondary | ICD-10-CM | POA: Diagnosis not present

## 2016-05-28 DIAGNOSIS — E039 Hypothyroidism, unspecified: Secondary | ICD-10-CM | POA: Insufficient documentation

## 2016-05-28 DIAGNOSIS — Z87891 Personal history of nicotine dependence: Secondary | ICD-10-CM | POA: Insufficient documentation

## 2016-05-28 HISTORY — PX: CATARACT EXTRACTION W/PHACO: SHX586

## 2016-05-28 SURGERY — PHACOEMULSIFICATION, CATARACT, WITH IOL INSERTION
Anesthesia: Monitor Anesthesia Care | Site: Eye | Laterality: Right

## 2016-05-28 MED ORDER — NA HYALUR & NA CHOND-NA HYALUR 0.4-0.35 ML IO KIT
PACK | INTRAOCULAR | Status: DC | PRN
  Administered 2016-05-28: 1 mL via INTRAOCULAR

## 2016-05-28 MED ORDER — BRIMONIDINE TARTRATE 0.2 % OP SOLN
OPHTHALMIC | Status: DC | PRN
  Administered 2016-05-28: 1 [drp] via OPHTHALMIC

## 2016-05-28 MED ORDER — CEFUROXIME OPHTHALMIC INJECTION 1 MG/0.1 ML
INJECTION | OPHTHALMIC | Status: DC | PRN
  Administered 2016-05-28: .3 mL via INTRACAMERAL

## 2016-05-28 MED ORDER — TIMOLOL MALEATE 0.5 % OP SOLN
OPHTHALMIC | Status: DC | PRN
  Administered 2016-05-28: 1 [drp]

## 2016-05-28 MED ORDER — ARMC OPHTHALMIC DILATING DROPS
1.0000 "application " | OPHTHALMIC | Status: DC | PRN
  Administered 2016-05-28 (×3): 1 via OPHTHALMIC

## 2016-05-28 MED ORDER — MIDAZOLAM HCL 2 MG/2ML IJ SOLN
INTRAMUSCULAR | Status: DC | PRN
  Administered 2016-05-28: 1 mg via INTRAVENOUS

## 2016-05-28 MED ORDER — IBUPROFEN 600 MG PO TABS
600.0000 mg | ORAL_TABLET | Freq: Once | ORAL | Status: AC
  Administered 2016-05-28: 600 mg via ORAL

## 2016-05-28 MED ORDER — EPINEPHRINE PF 1 MG/ML IJ SOLN
INTRAOCULAR | Status: DC | PRN
  Administered 2016-05-28: 92 mL via OPHTHALMIC

## 2016-05-28 MED ORDER — LIDOCAINE HCL (PF) 4 % IJ SOLN
INTRAOCULAR | Status: DC | PRN
  Administered 2016-05-28: 1 mL via OPHTHALMIC

## 2016-05-28 MED ORDER — MOXIFLOXACIN HCL 0.5 % OP SOLN
1.0000 [drp] | OPHTHALMIC | Status: DC | PRN
  Administered 2016-05-28 (×3): 1 [drp] via OPHTHALMIC

## 2016-05-28 MED ORDER — FENTANYL CITRATE (PF) 100 MCG/2ML IJ SOLN
INTRAMUSCULAR | Status: DC | PRN
  Administered 2016-05-28: 50 ug via INTRAVENOUS

## 2016-05-28 SURGICAL SUPPLY — 28 items
APPLICATOR COTTON TIP 3IN (MISCELLANEOUS) ×3 IMPLANT
CANNULA ANT/CHMB 27GA (MISCELLANEOUS) ×3 IMPLANT
DISSECTOR HYDRO NUCLEUS 50X22 (MISCELLANEOUS) ×3 IMPLANT
GLOVE BIO SURGEON STRL SZ7 (GLOVE) ×3 IMPLANT
GLOVE SURG LX 6.5 MICRO (GLOVE) ×2
GLOVE SURG LX STRL 6.5 MICRO (GLOVE) ×1 IMPLANT
GOWN STRL REUS W/ TWL LRG LVL3 (GOWN DISPOSABLE) ×2 IMPLANT
GOWN STRL REUS W/TWL LRG LVL3 (GOWN DISPOSABLE) ×4
LENS IOL ACRYSOF IQ 21.0 (Intraocular Lens) ×3 IMPLANT
MARKER SKIN DUAL TIP RULER LAB (MISCELLANEOUS) ×3 IMPLANT
NEEDLE FILTER BLUNT 18X 1/2SAF (NEEDLE) ×2
NEEDLE FILTER BLUNT 18X1 1/2 (NEEDLE) ×1 IMPLANT
PACK CATARACT BRASINGTON (MISCELLANEOUS) ×3 IMPLANT
PACK EYE AFTER SURG (MISCELLANEOUS) ×3 IMPLANT
PACK OPTHALMIC (MISCELLANEOUS) ×3 IMPLANT
RING MALYGIN 7.0 (MISCELLANEOUS) ×3 IMPLANT
SOL BAL SALT 15ML (MISCELLANEOUS)
SOLUTION BAL SALT 15ML (MISCELLANEOUS) IMPLANT
SUT ETHILON 10-0 CS-B-6CS-B-6 (SUTURE)
SUT VICRYL  9 0 (SUTURE)
SUT VICRYL 9 0 (SUTURE) IMPLANT
SUTURE EHLN 10-0 CS-B-6CS-B-6 (SUTURE) IMPLANT
SYR 3ML LL SCALE MARK (SYRINGE) ×3 IMPLANT
SYR TB 1ML LUER SLIP (SYRINGE) ×3 IMPLANT
WATER STERILE IRR 250ML POUR (IV SOLUTION) ×3 IMPLANT
WATER STERILE IRR 500ML POUR (IV SOLUTION) IMPLANT
WICK EYE OCUCEL (MISCELLANEOUS) ×3 IMPLANT
WIPE NON LINTING 3.25X3.25 (MISCELLANEOUS) ×3 IMPLANT

## 2016-05-28 NOTE — Transfer of Care (Signed)
Immediate Anesthesia Transfer of Care Note  Patient: Yvette Thomas  Procedure(s) Performed: Procedure(s) with comments: CATARACT EXTRACTION PHACO AND INTRAOCULAR LENS PLACEMENT (IOC) (Right) - RIGHT  Patient Location: PACU  Anesthesia Type: MAC  Level of Consciousness: awake, alert  and patient cooperative  Airway and Oxygen Therapy: Patient Spontanous Breathing and Patient connected to supplemental oxygen  Post-op Assessment: Post-op Vital signs reviewed, Patient's Cardiovascular Status Stable, Respiratory Function Stable, Patent Airway and No signs of Nausea or vomiting  Post-op Vital Signs: Reviewed and stable  Complications: No apparent anesthesia complications

## 2016-05-28 NOTE — Anesthesia Procedure Notes (Signed)
Procedure Name: MAC Performed by: Canuto Kingston Pre-anesthesia Checklist: Patient identified, Emergency Drugs available, Suction available, Timeout performed and Patient being monitored Patient Re-evaluated:Patient Re-evaluated prior to inductionOxygen Delivery Method: Nasal cannula Placement Confirmation: positive ETCO2     

## 2016-05-28 NOTE — H&P (Signed)
H+P reviewed and is up to date, please see paper chart.  

## 2016-05-28 NOTE — Op Note (Signed)
Date of Surgery: 05/28/2016  PREOPERATIVE DIAGNOSES: Visually significant nuclear sclerotic cataract, right eye.  POSTOPERATIVE DIAGNOSES: Same  PROCEDURES PERFORMED: Cataract extraction with intraocular lens implant, right eye.  SURGEON: Devin GoingAnita P. Vin, M.D.  ANESTHESIA: MAC and topical  IMPLANTS: [id number] +21.0 D  Implant Name Type Inv. Item Serial No. Manufacturer Lot No. LRB No. Used  LENS IOL ACRYSOF IQ 21.0 - [id number] Intraocular Lens LENS IOL ACRYSOF IQ 21.0 [id number] ALCON   Right 1     COMPLICATIONS: None.  DESCRIPTION OF PROCEDURE: Therapeutic options were discussed with the patient preoperatively, including a discussion of risks and benefits of surgery. Informed consent was obtained. An IOL-Master and immersion biometry were used to take the lens measurements, and a dilated fundus exam was performed within 6 months of the surgical date.  The patient was premedicated and brought to the operating room and placed on the operating table in the supine position. After adequate anesthesia, the patient was prepped and draped in the usual sterile ophthalmic fashion. A wire lid speculum was inserted and the microscope was positioned. A Superblade was used to create a paracentesis site at the limbus and a small amount of dilute preservative free lidocaine was instilled into the anterior chamber, followed by dispersive viscoelastic. A clear corneal incision was created temporally using a 2.4 mm keratome blade. Capsulorrhexis was then performed. In situ phacoemulsification was performed.  Cortical material was removed with the irrigation-aspiration unit. Dispersive viscoelastic was instilled to open the capsular bag. A posterior chamber intraocular lens with the specifications above was inserted and positioned. Irrigation-aspiration was used to remove all viscoelastic. Cefuroxime 1cc was instilled into the anterior chamber, and the corneal incision was checked and found to be water tight.  The eyelid speculum was removed.  The operative eye was covered with protective goggles after instilling 1 drop of timolol and brimonidine. The patient tolerated the procedure well. There were no complications.

## 2016-05-28 NOTE — Anesthesia Postprocedure Evaluation (Signed)
Anesthesia Post Note  Patient: Yvette Thomas  Procedure(s) Performed: Procedure(s) (LRB): CATARACT EXTRACTION PHACO AND INTRAOCULAR LENS PLACEMENT (IOC) (Right)  Patient location during evaluation: PACU Anesthesia Type: MAC Level of consciousness: awake and alert Pain management: pain level controlled Vital Signs Assessment: post-procedure vital signs reviewed and stable Respiratory status: spontaneous breathing, nonlabored ventilation, respiratory function stable and patient connected to nasal cannula oxygen Cardiovascular status: stable and blood pressure returned to baseline Anesthetic complications: no    Scarlette Sliceachel B Ginny Loomer

## 2016-05-28 NOTE — Anesthesia Preprocedure Evaluation (Signed)
Anesthesia Evaluation  Patient identified by MRN, date of birth, ID band Patient awake    Reviewed: Allergy & Precautions, H&P , NPO status , Patient's Chart, lab work & pertinent test results, reviewed documented beta blocker date and time   Airway Mallampati: II  TM Distance: >3 FB Neck ROM: full    Dental no notable dental hx.    Pulmonary neg pulmonary ROS, former smoker,    Pulmonary exam normal breath sounds clear to auscultation       Cardiovascular Exercise Tolerance: Good hypertension,  Rhythm:regular Rate:Normal     Neuro/Psych negative neurological ROS  negative psych ROS   GI/Hepatic Neg liver ROS, GERD  ,  Endo/Other  negative endocrine ROSHypothyroidism   Renal/GU Renal disease  negative genitourinary   Musculoskeletal   Abdominal   Peds  Hematology negative hematology ROS (+)   Anesthesia Other Findings   Reproductive/Obstetrics negative OB ROS                             Anesthesia Physical Anesthesia Plan  ASA: II  Anesthesia Plan: MAC   Post-op Pain Management:    Induction:   Airway Management Planned:   Additional Equipment:   Intra-op Plan:   Post-operative Plan:   Informed Consent: I have reviewed the patients History and Physical, chart, labs and discussed the procedure including the risks, benefits and alternatives for the proposed anesthesia with the patient or authorized representative who has indicated his/her understanding and acceptance.   Dental Advisory Given  Plan Discussed with: CRNA  Anesthesia Plan Comments:         Anesthesia Quick Evaluation

## 2016-05-29 ENCOUNTER — Encounter: Payer: Self-pay | Admitting: Ophthalmology

## 2016-06-19 DIAGNOSIS — M545 Low back pain: Secondary | ICD-10-CM | POA: Diagnosis not present

## 2016-06-26 DIAGNOSIS — M545 Low back pain: Secondary | ICD-10-CM | POA: Diagnosis not present

## 2016-06-28 DIAGNOSIS — M545 Low back pain: Secondary | ICD-10-CM | POA: Diagnosis not present

## 2016-06-29 ENCOUNTER — Encounter: Payer: Self-pay | Admitting: *Deleted

## 2016-06-29 DIAGNOSIS — H2512 Age-related nuclear cataract, left eye: Secondary | ICD-10-CM | POA: Diagnosis not present

## 2016-07-03 DIAGNOSIS — M545 Low back pain: Secondary | ICD-10-CM | POA: Diagnosis not present

## 2016-07-05 DIAGNOSIS — M545 Low back pain: Secondary | ICD-10-CM | POA: Diagnosis not present

## 2016-07-06 NOTE — Discharge Instructions (Signed)
Cataract Surgery, Care After °Refer to this sheet in the next few weeks. These instructions provide you with information about caring for yourself after your procedure. Your health care provider may also give you more specific instructions. Your treatment has been planned according to current medical practices, but problems sometimes occur. Call your health care provider if you have any problems or questions after your procedure. °What can I expect after the procedure? °After the procedure, it is common to have: °· Itching. °· Discomfort. °· Fluid discharge. °· Sensitivity to light and to touch. °· Bruising. °Follow these instructions at home: °Eye Care  °· Check your eye every day for signs of infection. Watch for: °¨ Redness, swelling, or pain. °¨ Fluid, blood, or pus. °¨ Warmth. °¨ Bad smell. °Activity  °· Avoid strenuous activities, such as playing contact sports, for as long as told by your health care provider. °· Do not drive or operate heavy machinery until your health care provider approves. °· Do not bend or lift heavy objects . Bending increases pressure in the eye. You can walk, climb stairs, and do light household chores. °· Ask your health care provider when you can return to work. If you work in a dusty environment, you may be advised to wear protective eyewear for a period of time. °General instructions  °· Take or apply over-the-counter and prescription medicines only as told by your health care provider. This includes eye drops. °· Do not touch or rub your eyes. °· If you were given a protective shield, wear it as told by your health care provider. If you were not given a protective shield, wear sunglasses as told by your health care provider to protect your eyes. °· Keep the area around your eye clean and dry. Avoid swimming or allowing water to hit you directly in the face while showering until told by your health care provider. Keep soap and shampoo out of your eyes. °· Do not put a contact lens  into the affected eye or eyes until your health care provider approves. °· Keep all follow-up visits as told by your health care provider. This is important. °Contact a health care provider if: ° °· You have increased bruising around your eye. °· You have pain that is not helped with medicine. °· You have a fever. °· You have redness, swelling, or pain in your eye. °· You have fluid, blood, or pus coming from your incision. °· Your vision gets worse. °Get help right away if: °· You have sudden vision loss. °This information is not intended to replace advice given to you by your health care provider. Make sure you discuss any questions you have with your health care provider. °Document Released: 01/05/2005 Document Revised: 10/27/2015 Document Reviewed: 04/28/2015 °Elsevier Interactive Patient Education © 2017 [employer] ° ° ° ° °General Anesthesia, Adult, Care After °These instructions provide you with information about caring for yourself after your procedure. Your health care provider may also give you more specific instructions. Your treatment has been planned according to current medical practices, but problems sometimes occur. Call your health care provider if you have any problems or questions after your procedure. °What can I expect after the procedure? °After the procedure, it is common to have: °· Vomiting. °· A sore throat. °· Mental slowness. °It is common to feel: °· Nauseous. °· Cold or shivery. °· Sleepy. °· Tired. °· Sore or achy, even in parts of your body where you did not have surgery. °Follow these instructions at   home: °For at least 24 hours after the procedure:  °· Do not: °¨ Participate in activities where you could fall or become injured. °¨ Drive. °¨ Use heavy machinery. °¨ Drink alcohol. °¨ Take sleeping pills or medicines that cause drowsiness. °¨ Make important decisions or sign legal documents. °¨ Take care of children on your own. °· Rest. °Eating and drinking  °· If you vomit, drink  water, juice, or soup when you can drink without vomiting. °· Drink enough fluid to keep your urine clear or pale yellow. °· Make sure you have little or no nausea before eating solid foods. °· Follow the diet recommended by your health care provider. °General instructions  °· Have a responsible adult stay with you until you are awake and alert. °· Return to your normal activities as told by your health care provider. Ask your health care provider what activities are safe for you. °· Take over-the-counter and prescription medicines only as told by your health care provider. °· If you smoke, do not smoke without supervision. °· Keep all follow-up visits as told by your health care provider. This is important. °Contact a health care provider if: °· You continue to have nausea or vomiting at home, and medicines are not helpful. °· You cannot drink fluids or start eating again. °· You cannot urinate after 8-12 hours. °· You develop a skin rash. °· You have fever. °· You have increasing redness at the site of your procedure. °Get help right away if: °· You have difficulty breathing. °· You have chest pain. °· You have unexpected bleeding. °· You feel that you are having a life-threatening or urgent problem. °This information is not intended to replace advice given to you by your health care provider. Make sure you discuss any questions you have with your health care provider. °Document Released: 09/24/2000 Document Revised: 11/21/2015 Document Reviewed: 06/02/2015 °Elsevier Interactive Patient Education © 2017 [employer] ° °

## 2016-07-09 ENCOUNTER — Ambulatory Visit: Payer: PPO | Admitting: Anesthesiology

## 2016-07-09 ENCOUNTER — Encounter
Admission: RE | Disposition: A | Discharge status: Home / Self Care | Payer: Self-pay | Source: Ambulatory Visit | Attending: Ophthalmology

## 2016-07-09 ENCOUNTER — Ambulatory Visit
Admission: RE | Admit: 2016-07-09 | Discharge: 2016-07-09 | Disposition: A | Discharge status: Home / Self Care | Payer: PPO | Source: Ambulatory Visit | Attending: Ophthalmology | Admitting: Ophthalmology

## 2016-07-09 ENCOUNTER — Telehealth: Payer: Self-pay | Admitting: Internal Medicine

## 2016-07-09 DIAGNOSIS — Z7982 Long term (current) use of aspirin: Secondary | ICD-10-CM | POA: Diagnosis not present

## 2016-07-09 DIAGNOSIS — M810 Age-related osteoporosis without current pathological fracture: Secondary | ICD-10-CM | POA: Insufficient documentation

## 2016-07-09 DIAGNOSIS — Z87891 Personal history of nicotine dependence: Secondary | ICD-10-CM | POA: Diagnosis not present

## 2016-07-09 DIAGNOSIS — R040 Epistaxis: Secondary | ICD-10-CM | POA: Insufficient documentation

## 2016-07-09 DIAGNOSIS — Z5309 Procedure and treatment not carried out because of other contraindication: Secondary | ICD-10-CM | POA: Diagnosis not present

## 2016-07-09 DIAGNOSIS — I10 Essential (primary) hypertension: Secondary | ICD-10-CM | POA: Diagnosis not present

## 2016-07-09 DIAGNOSIS — R05 Cough: Secondary | ICD-10-CM | POA: Diagnosis not present

## 2016-07-09 DIAGNOSIS — F419 Anxiety disorder, unspecified: Secondary | ICD-10-CM | POA: Diagnosis not present

## 2016-07-09 DIAGNOSIS — Z79899 Other long term (current) drug therapy: Secondary | ICD-10-CM | POA: Diagnosis not present

## 2016-07-09 DIAGNOSIS — H269 Unspecified cataract: Secondary | ICD-10-CM | POA: Diagnosis not present

## 2016-07-09 DIAGNOSIS — H2512 Age-related nuclear cataract, left eye: Secondary | ICD-10-CM | POA: Diagnosis not present

## 2016-07-09 SURGERY — CANCELLED PROCEDURE
Anesthesia: Monitor Anesthesia Care | Laterality: Left

## 2016-07-09 MED ORDER — OXYMETAZOLINE HCL 0.05 % NA SOLN
NASAL | Status: DC | PRN
  Administered 2016-07-09 (×2): 1 via TOPICAL

## 2016-07-09 MED ORDER — HYDRALAZINE HCL 20 MG/ML IJ SOLN
INTRAMUSCULAR | Status: DC | PRN
  Administered 2016-07-09: 5 mg via INTRAVENOUS

## 2016-07-09 MED ORDER — ARMC OPHTHALMIC DILATING DROPS
1.0000 "application " | OPHTHALMIC | Status: DC | PRN
  Administered 2016-07-09 (×3): 1 via OPHTHALMIC

## 2016-07-09 MED ORDER — FENTANYL CITRATE (PF) 100 MCG/2ML IJ SOLN
INTRAMUSCULAR | Status: DC | PRN
  Administered 2016-07-09: 50 ug via INTRAVENOUS

## 2016-07-09 MED ORDER — MIDAZOLAM HCL 2 MG/2ML IJ SOLN
INTRAMUSCULAR | Status: DC | PRN
  Administered 2016-07-09: 1 mg via INTRAVENOUS

## 2016-07-09 MED ORDER — MOXIFLOXACIN HCL 0.5 % OP SOLN
1.0000 [drp] | OPHTHALMIC | Status: DC | PRN
  Administered 2016-07-09 (×3): 1 [drp] via OPHTHALMIC

## 2016-07-09 SURGICAL SUPPLY — 27 items
APPLICATOR COTTON TIP 3IN (MISCELLANEOUS) IMPLANT
CANNULA ANT/CHMB 27GA (MISCELLANEOUS) IMPLANT
DISSECTOR HYDRO NUCLEUS 50X22 (MISCELLANEOUS) IMPLANT
GLOVE BIO SURGEON STRL SZ7 (GLOVE) IMPLANT
GLOVE SURG LX 6.5 MICRO (GLOVE)
GLOVE SURG LX STRL 6.5 MICRO (GLOVE) IMPLANT
GOWN STRL REUS W/ TWL LRG LVL3 (GOWN DISPOSABLE) IMPLANT
GOWN STRL REUS W/TWL LRG LVL3 (GOWN DISPOSABLE)
MARKER SKIN DUAL TIP RULER LAB (MISCELLANEOUS) IMPLANT
NEEDLE FILTER BLUNT 18X 1/2SAF (NEEDLE)
NEEDLE FILTER BLUNT 18X1 1/2 (NEEDLE) IMPLANT
PACK CATARACT BRASINGTON (MISCELLANEOUS) IMPLANT
PACK EYE AFTER SURG (MISCELLANEOUS) IMPLANT
PACK OPTHALMIC (MISCELLANEOUS) IMPLANT
RING MALYGIN 7.0 (MISCELLANEOUS) IMPLANT
SOL BAL SALT 15ML (MISCELLANEOUS)
SOLUTION BAL SALT 15ML (MISCELLANEOUS) IMPLANT
SUT ETHILON 10-0 CS-B-6CS-B-6 (SUTURE)
SUT VICRYL  9 0 (SUTURE)
SUT VICRYL 9 0 (SUTURE) IMPLANT
SUTURE EHLN 10-0 CS-B-6CS-B-6 (SUTURE) IMPLANT
SYR 3ML LL SCALE MARK (SYRINGE) IMPLANT
SYR TB 1ML LUER SLIP (SYRINGE) IMPLANT
WATER STERILE IRR 250ML POUR (IV SOLUTION) IMPLANT
WATER STERILE IRR 500ML POUR (IV SOLUTION) IMPLANT
WICK EYE OCUCEL (MISCELLANEOUS) IMPLANT
WIPE NON LINTING 3.25X3.25 (MISCELLANEOUS) IMPLANT

## 2016-07-09 NOTE — Anesthesia Postprocedure Evaluation (Signed)
Anesthesia Post Note  Patient: Yvette Thomas  Procedure(s) Performed: Procedure(s) (LRB): CANCELLED PROCEDURE (Left)  Patient location during evaluation: PACU Anesthesia Type: MAC Level of consciousness: awake and alert Pain management: pain level controlled Vital Signs Assessment: post-procedure vital signs reviewed and stable Respiratory status: spontaneous breathing, nonlabored ventilation, respiratory function stable and patient connected to nasal cannula oxygen Cardiovascular status: stable and blood pressure returned to baseline Anesthetic complications: no Comments: Case cancelled due to nose bleed.    Alisa Graff

## 2016-07-09 NOTE — Anesthesia Procedure Notes (Signed)
Procedure Name: MAC Date/Time: 07/09/2016 12:14 PM Performed by: Janna Arch Pre-anesthesia Checklist: Patient identified, Emergency Drugs available, Suction available and Patient being monitored Patient Re-evaluated:Patient Re-evaluated prior to inductionOxygen Delivery Method: Nasal cannula

## 2016-07-09 NOTE — Brief Op Note (Signed)
07/09/2016  12:54 PM  PATIENT:  Yvette Thomas  83 y.o. female  PRE-OPERATIVE DIAGNOSIS:  H25.12- CATARACT  POST-OPERATIVE DIAGNOSIS:  left cataract  PROCEDURE:  Procedure(s) with comments: CANCELLED PROCEDURE (Left) - LEFT  SURGEON:  Surgeon(s) and Role:    * Ronnell Freshwater, MD - Primary   ANESTHESIA:   MAC  Patient was prepped and drape in the usual sterile ophthalmic fashion.  Prior to the start of surgery, the patient had intractable cough.  She was sit up in bed and was found to have a relatively brisk nosebleed from the left nare.  The anesthesia team suctioned her and placed Afrin in the left nare.  Nasal packing was placed in the nare after this to tamponade the bleed. The patient remained alert and hemodynamically stable during this episode.  See anesthesia note for further detail.  Cataract surgery will be rescheduled.  Joan Mayans, MD Ophthalmology

## 2016-07-09 NOTE — Anesthesia Preprocedure Evaluation (Signed)
Anesthesia Evaluation  Patient identified by MRN, date of birth, ID band Patient awake    Reviewed: Allergy & Precautions, H&P , NPO status , Patient's Chart, lab work & pertinent test results, reviewed documented beta blocker date and time   Airway Mallampati: II  TM Distance: >3 FB Neck ROM: full    Dental no notable dental hx.    Pulmonary neg pulmonary ROS, former smoker,    Pulmonary exam normal breath sounds clear to auscultation       Cardiovascular Exercise Tolerance: Good hypertension, negative cardio ROS   Rhythm:regular Rate:Normal     Neuro/Psych PSYCHIATRIC DISORDERS (anxiety) negative neurological ROS     GI/Hepatic Neg liver ROS, GERD  ,  Endo/Other  negative endocrine ROSHypothyroidism   Renal/GU negative Renal ROS  negative genitourinary   Musculoskeletal   Abdominal   Peds  Hematology negative hematology ROS (+)   Anesthesia Other Findings   Reproductive/Obstetrics negative OB ROS                             Anesthesia Physical Anesthesia Plan  ASA: II  Anesthesia Plan: MAC   Post-op Pain Management:    Induction:   Airway Management Planned:   Additional Equipment:   Intra-op Plan:   Post-operative Plan:   Informed Consent: I have reviewed the patients History and Physical, chart, labs and discussed the procedure including the risks, benefits and alternatives for the proposed anesthesia with the patient or authorized representative who has indicated his/her understanding and acceptance.   Dental Advisory Given  Plan Discussed with: CRNA  Anesthesia Plan Comments:         Anesthesia Quick Evaluation

## 2016-07-09 NOTE — OR Nursing (Signed)
Dr Franky MachoBeach, removed cottonoid to left nare and large clot removed from nose, so a 2nd application of Afrin via cottonoid to left nare applied by Dr Franky MachoBeach again. Surgery cancelled. Dr Georgianne FickVin spoke with daughter.

## 2016-07-09 NOTE — OR Nursing (Signed)
Prior to starting case, patient kept coughing, drapes were removed and patient had a bloody nose. Dr Georgianne FickVin and Dr Van ClinesBeech at bedside. The patient was then  placed in high fowlers. Then Afrin soaked sponge was applied to left nare by Dr Van ClinesBeech and manual pressure applied by Dr Van ClinesBeech as well. Pt alert and talking. Stated she had 2 prior nose bleeds this week.

## 2016-07-09 NOTE — Telephone Encounter (Signed)
Patient Name: Yvette Thomas  DOB: [birth year]    Initial Comment Caller states mother had to cancel eye surgery this morning, had a nose bleed and bp high    Nurse Assessment  Nurse: Stefano GaulStringer, RN, Dwana CurdVera Date/Time (Eastern Time): 07/09/2016 2:02:23 PM  Confirm and document reason for call. If symptomatic, describe symptoms. ---Caller states mother had to cancel her left eye cataract surgery this am. Had a nosebleed right before surgery. Packing was inserted and bleeding stopped. Packing was removed and not bleeding now. BP was over 200/60s at that time. Had nosebleed about 3 weeks ago. She is fixing her lunch and eating.  Does the patient have any new or worsening symptoms? ---Yes  Will a triage be completed? ---Yes  Related visit to physician within the last 2 weeks? ---No  Does the PT have any chronic conditions? (i.e. diabetes, asthma, etc.) ---Yes  List chronic conditions. ---HTN; heart murmur  Is this a behavioral health or substance abuse call? ---No     Guidelines    Guideline Title Affirmed Question Affirmed Notes  Nosebleed [1] Mild-moderate nosebleed AND [2] bleeding stopped now (all triage questions negative)    Final Disposition User   See PCP When Office is Open (within 3 days) Stefano GaulStringer, RN, Dwana CurdVera    Comments  triage outcome upgraded to see physician within 72 hrs as BP was 200/60s prior to cataract surgery today and surgery was cancelled  appt scheduled for 07/10/2016 at 11:45 am with Dr. Tillman Abideichard Letvak   Disagree/Comply: Comply

## 2016-07-09 NOTE — Transfer of Care (Signed)
Immediate Anesthesia Transfer of Care Note  Patient: Yvette Thomas  Procedure(s) Performed: Procedure(s) with comments: CATARACT EXTRACTION PHACO AND INTRAOCULAR LENS PLACEMENT (IOC) (Left) - LEFT  Patient Location: PACU  Anesthesia Type: MAC  Level of Consciousness: awake, alert  and patient cooperative  Airway and Oxygen Therapy: Patient Spontanous Breathing and Patient connected to supplemental oxygen  Post-op Assessment: Post-op Vital signs reviewed, Patient's Cardiovascular Status Stable, Respiratory Function Stable, Patent Airway and No signs of Nausea or vomiting  Post-op Vital Signs: Reviewed and stable  Complications: No apparent anesthesia complications

## 2016-07-09 NOTE — H&P (Signed)
H+P reviewed and is up to date, please see paper chart.  

## 2016-07-09 NOTE — Telephone Encounter (Signed)
Okay I think she has generally been pretty good but we can review at the visit

## 2016-07-09 NOTE — Telephone Encounter (Signed)
Pt has appt with Dr Alphonsus SiasLetvak 07/10/16 at 11:45.

## 2016-07-10 ENCOUNTER — Ambulatory Visit (INDEPENDENT_AMBULATORY_CARE_PROVIDER_SITE_OTHER): Payer: PPO | Admitting: Internal Medicine

## 2016-07-10 ENCOUNTER — Encounter: Payer: Self-pay | Admitting: Internal Medicine

## 2016-07-10 VITALS — BP 126/64 | HR 68 | Temp 97.2°F | Wt 145.0 lb

## 2016-07-10 DIAGNOSIS — R040 Epistaxis: Secondary | ICD-10-CM | POA: Diagnosis not present

## 2016-07-10 DIAGNOSIS — I10 Essential (primary) hypertension: Secondary | ICD-10-CM | POA: Diagnosis not present

## 2016-07-10 NOTE — Assessment & Plan Note (Signed)
Discussed getting her humidifier going Use afrin for 2 days To ENT if persistent bleeding

## 2016-07-10 NOTE — Patient Instructions (Signed)
Please try afrin (or generic) 1 spray into left nose twice a day for the next 2 days. You may want to use the Ayr regularly again. Get your humidifier going again. If you have more bleeding--- you should see an ENT specialist.

## 2016-07-10 NOTE — Progress Notes (Signed)
Pre visit review using our clinic review tool, if applicable. No additional management support is needed unless otherwise documented below in the visit note. 

## 2016-07-10 NOTE — Progress Notes (Signed)
Subjective:    Patient ID: Yvette Thomas, female    DOB: 07-09-1924, 83 y.o.   MRN: [id number]  HPI Here due to nose bleed and elevated BP when in for cataract surgery  Reviewed surgeon's note Started coughing before anaesthesia--then had brisk nosebleed Suctioned, afrin then packed BP was up then (didn't take her meds--as directed)--got med in IV  First nosebleed ever was ~3 weeks ago She just put pressure on it and it stopped (uses plug of tissue) Had pimple in left nare--was using neosporin. Might have scratched it then--checking to see if still there  Bled from left side again last night Clots when blew nose this morning  Uses ayr occasionally No other medications Gas heat---no humidifier  Current Outpatient Prescriptions on File Prior to Visit  Medication Sig Dispense Refill  . aspirin EC 81 MG tablet Take 81 mg by mouth every other day.    . benazepril (LOTENSIN) 40 MG tablet TAKE ONE TABLET BY MOUTH ONCE DAILY 30 tablet 6  . Calcium Carbonate-Vitamin D (CALCIUM 600+D) 600-400 MG-UNIT per tablet Take 1 tablet by mouth 2 (two) times daily.      . Cholecalciferol (VITAMIN D-3 PO) Take 2 tablets by mouth daily.     Marland Kitchen. doxazosin (CARDURA) 2 MG tablet Take 1 tablet (2 mg total) by mouth daily. 60 tablet 11  . ibuprofen (ADVIL,MOTRIN) 200 MG tablet Take 200-400 mg by mouth 2 (two) times daily as needed. For arthritis pain    . Multiple Vitamins-Minerals (CENTRUM SILVER PO) Take 1 tablet by mouth daily.      . Omega-3 Fatty Acids (FISH OIL) 1200 MG CAPS Take by mouth daily.    . Red Yeast Rice 600 MG CAPS Take 2 capsules by mouth daily.     . sertraline (ZOLOFT) 50 MG tablet TAKE ONE TABLET BY MOUTH ONCE DAILY 90 tablet 0  . Vitamins-Lipotropics (B-50 COMPLEX) TABS Take 1 tablet by mouth daily.       No current facility-administered medications on file prior to visit.     Allergies  Allergen Reactions  . Buspirone Hcl     REACTION: Tinnitis, dizzy  . Oxybutynin Chloride       REACTION: Dried up  . Tolterodine Tartrate     REACTION: Headache    Past Medical History:  Diagnosis Date  . Allergic rhinitis, cause unspecified   . Anxiety   . Colon polyps   . Fibrocystic breast   . GERD (gastroesophageal reflux disease)   . Hyperlipidemia   . Hypertension   . Hypothyroidism   . Osteoarthritis of multiple joints   . Osteoporosis   . Pyelitis   . Syncope and collapse    Hx  . Urinary incontinence   . Vertigo     Past Surgical History:  Procedure Laterality Date  . CATARACT EXTRACTION W/PHACO Right 05/28/2016   Procedure: CATARACT EXTRACTION PHACO AND INTRAOCULAR LENS PLACEMENT (IOC);  Surgeon: Sherald HessAnita Prakash Vin-Parikh, MD;  [facility];  Service: Ophthalmology;  Laterality: Right;  RIGHT  . THYROIDECTOMY, PARTIAL  1996    Family History  Problem Relation Age of Onset  . Hypertension Sister   . Melanoma Sister   . Heart disease Mother   . Heart disease Father   . Hypertension Brother     Social History   Social History  . Marital status: Widowed    Spouse name: N/A  . Number of children: 2  . Years of education: N/A   Occupational History  .  RETIRED     [employer]   Social History Main Topics  . Smoking status: Former Smoker    Years: 25.00    Quit date: 07/02/1968  . Smokeless tobacco: Never Used  . Alcohol use No  . Drug use: No  . Sexual activity: Not on file   Other Topics Concern  . Not on file   Social History Narrative   Has living will    Daughter is health care POA.   Would accept resuscitation attempts but no prolonged artificial ventilation.   No tube feedings if cognitively unaware   Review of Systems  No recent cold symptoms Ongoing back symptoms---is taking the therapy     Objective:   Physical Exam  HENT:  No obvious bleeding spot in left nare---some redness along septum          Assessment & Plan:

## 2016-07-10 NOTE — Assessment & Plan Note (Signed)
BP Readings from Last 3 Encounters:  07/10/16 126/64  07/09/16 (!) 166/63  05/28/16 (!) 172/53   Was high after nosebleed and didn't take meds Fine now No changes needed Okay to proceed with the cataract again when on the schedule

## 2016-07-13 ENCOUNTER — Telehealth: Payer: Self-pay | Admitting: Internal Medicine

## 2016-07-13 NOTE — Telephone Encounter (Signed)
Pt said was already called by Dr Karle StarchLetvak's office and was given advise on what to do and if had more problems over weekend [facility] not [facility] to [facility]fyi to Dr Alphonsus SiasLetvak.

## 2016-07-13 NOTE — Telephone Encounter (Signed)
[facility] Day - Client TELEPHONE ADVICE RECORD [facility] Patient Name: Yvette Thomas DOB: [birth year] Initial Comment Caller states she's having a cough, Coughing up mucus, headache. Chills. Sore throat. Nurse Assessment Nurse: Harlon FlorWhitaker, RN, Darl PikesSusan Date/Time (Eastern Time): 07/13/2016 4:10:05 PM Confirm and document reason for call. If symptomatic, describe symptoms. ---Caller states she's having a cough, Coughing up mucus, headache. Chills. Sore throat. Symptoms began Mon 07/09/16 she was to have cataract procedure and she was there and had a coughing jag and surgery was cancelled. she saw PCP tuesday. DX viral infection afrin for nose . for 3 days. nosebled at cataract surgery center but none since. body aches. 96.7 AX Does the patient have any new or worsening symptoms? ---Yes Will a triage be completed? ---Yes Related visit to physician within the last 2 weeks? ---Yes Does the PT have any chronic conditions? (i.e. diabetes, asthma, etc.) ---Yes List chronic conditions. ---HTN, incontinence ,. occ diarrhea , arthritis in back and shoulders going to therapy osteoporosis , neck hurts stooped over vaporizer Is this a behavioral health or substance abuse call? ---No Guidelines Guideline Title Affirmed Question Affirmed Notes Common Cold [1] Sinus congestion (pressure, fullness) AND [2] present > 10 days Final Disposition User See PCP When Office is Open (within 3 days) Harlon FlorWhitaker, RN, Darl PikesSusan Referrals REFERRED TO PCP OFFICE Disagree/Comply: Comply Call Id: [phone]

## 2016-07-14 DIAGNOSIS — J00 Acute nasopharyngitis [common cold]: Secondary | ICD-10-CM | POA: Diagnosis not present

## 2016-07-14 NOTE — Telephone Encounter (Signed)
Please check on her Monday morning 

## 2016-07-16 NOTE — Telephone Encounter (Signed)
Patient's daughter called stating that her mom asked her to call you back because she gets confused. Consuella Lose. Kallie the daughter stated that they did get the Zyrtec, but has not picked the Zpak up yet and wants to know if you think that they should get it filled? Please call Consuella Loselaine the daughter back at [phone]

## 2016-07-16 NOTE — Telephone Encounter (Signed)
It sounds like she just has a viral infection--and a z-pak doesn't help for that. Zyrtec may dry up some runny nose--but is not really that good for a bad cold either. If she is feeling better (no fever and breathing easily), she probably can hold off on the z-pak

## 2016-07-16 NOTE — Telephone Encounter (Signed)
Called pt. She said she is still coughing a lot. Said she feels better today. She did go to [facility] and was told she just had a bad cold. She was given a rx for Zyrtec but has not picked it up yet.

## 2016-07-16 NOTE — Telephone Encounter (Signed)
Spoke to daughter. She will try some Delsym for the cough

## 2016-07-20 ENCOUNTER — Other Ambulatory Visit: Payer: Self-pay

## 2016-07-20 MED ORDER — BENAZEPRIL HCL 40 MG PO TABS: 40.0000 mg | ORAL_TABLET | Freq: Every day | ORAL | 6 refills | Status: DC

## 2016-07-23 ENCOUNTER — Telehealth: Payer: Self-pay

## 2016-07-23 NOTE — Telephone Encounter (Signed)
Pt left v/m; pt was seen 07/10/16 and pt continued to cough and pt went to [facility] and was given abx; pt was not sure if should take mycin abx due to possible side effects. Pt has been taking mucinex which has helped slightly; pt does not want to schedule appt because does not feel like coming out of house. Pt request cb. [facility].

## 2016-07-23 NOTE — Telephone Encounter (Signed)
Spoke to pt. She will start the abx.

## 2016-07-23 NOTE — Telephone Encounter (Signed)
If she is still sick after almost 2 weeks, it is probably worth trying the antibiotic

## 2016-07-25 ENCOUNTER — Encounter: Payer: Self-pay | Admitting: *Deleted

## 2016-07-27 NOTE — Discharge Instructions (Signed)
Cataract Surgery, Care After °Refer to this sheet in the next few weeks. These instructions provide you with information about caring for yourself after your procedure. Your health care provider may also give you more specific instructions. Your treatment has been planned according to current medical practices, but problems sometimes occur. Call your health care provider if you have any problems or questions after your procedure. °What can I expect after the procedure? °After the procedure, it is common to have: °· Itching. °· Discomfort. °· Fluid discharge. °· Sensitivity to light and to touch. °· Bruising. °Follow these instructions at home: °Eye Care  °· Check your eye every day for signs of infection. Watch for: °¨ Redness, swelling, or pain. °¨ Fluid, blood, or pus. °¨ Warmth. °¨ Bad smell. °Activity  °· Avoid strenuous activities, such as playing contact sports, for as long as told by your health care provider. °· Do not drive or operate heavy machinery until your health care provider approves. °· Do not bend or lift heavy objects . Bending increases pressure in the eye. You can walk, climb stairs, and do light household chores. °· Ask your health care provider when you can return to work. If you work in a dusty environment, you may be advised to wear protective eyewear for a period of time. °General instructions  °· Take or apply over-the-counter and prescription medicines only as told by your health care provider. This includes eye drops. °· Do not touch or rub your eyes. °· If you were given a protective shield, wear it as told by your health care provider. If you were not given a protective shield, wear sunglasses as told by your health care provider to protect your eyes. °· Keep the area around your eye clean and dry. Avoid swimming or allowing water to hit you directly in the face while showering until told by your health care provider. Keep soap and shampoo out of your eyes. °· Do not put a contact lens  into the affected eye or eyes until your health care provider approves. °· Keep all follow-up visits as told by your health care provider. This is important. °Contact a health care provider if: ° °· You have increased bruising around your eye. °· You have pain that is not helped with medicine. °· You have a fever. °· You have redness, swelling, or pain in your eye. °· You have fluid, blood, or pus coming from your incision. °· Your vision gets worse. °Get help right away if: °· You have sudden vision loss. °This information is not intended to replace advice given to you by your health care provider. Make sure you discuss any questions you have with your health care provider. °Document Released: 01/05/2005 Document Revised: 10/27/2015 Document Reviewed: 04/28/2015 °Elsevier Interactive Patient Education © 2017 [employer] ° ° ° ° °General Anesthesia, Adult, Care After °These instructions provide you with information about caring for yourself after your procedure. Your health care provider may also give you more specific instructions. Your treatment has been planned according to current medical practices, but problems sometimes occur. Call your health care provider if you have any problems or questions after your procedure. °What can I expect after the procedure? °After the procedure, it is common to have: °· Vomiting. °· A sore throat. °· Mental slowness. °It is common to feel: °· Nauseous. °· Cold or shivery. °· Sleepy. °· Tired. °· Sore or achy, even in parts of your body where you did not have surgery. °Follow these instructions at   home: °For at least 24 hours after the procedure:  °· Do not: °¨ Participate in activities where you could fall or become injured. °¨ Drive. °¨ Use heavy machinery. °¨ Drink alcohol. °¨ Take sleeping pills or medicines that cause drowsiness. °¨ Make important decisions or sign legal documents. °¨ Take care of children on your own. °· Rest. °Eating and drinking  °· If you vomit, drink  water, juice, or soup when you can drink without vomiting. °· Drink enough fluid to keep your urine clear or pale yellow. °· Make sure you have little or no nausea before eating solid foods. °· Follow the diet recommended by your health care provider. °General instructions  °· Have a responsible adult stay with you until you are awake and alert. °· Return to your normal activities as told by your health care provider. Ask your health care provider what activities are safe for you. °· Take over-the-counter and prescription medicines only as told by your health care provider. °· If you smoke, do not smoke without supervision. °· Keep all follow-up visits as told by your health care provider. This is important. °Contact a health care provider if: °· You continue to have nausea or vomiting at home, and medicines are not helpful. °· You cannot drink fluids or start eating again. °· You cannot urinate after 8-12 hours. °· You develop a skin rash. °· You have fever. °· You have increasing redness at the site of your procedure. °Get help right away if: °· You have difficulty breathing. °· You have chest pain. °· You have unexpected bleeding. °· You feel that you are having a life-threatening or urgent problem. °This information is not intended to replace advice given to you by your health care provider. Make sure you discuss any questions you have with your health care provider. °Document Released: 09/24/2000 Document Revised: 11/21/2015 Document Reviewed: 06/02/2015 °Elsevier Interactive Patient Education © 2017 [employer] ° °

## 2016-07-30 ENCOUNTER — Ambulatory Visit: Payer: PPO | Admitting: Anesthesiology

## 2016-07-30 ENCOUNTER — Ambulatory Visit
Admission: RE | Admit: 2016-07-30 | Discharge: 2016-07-30 | Disposition: A | Discharge status: Home / Self Care | Payer: PPO | Source: Ambulatory Visit | Attending: Ophthalmology | Admitting: Ophthalmology

## 2016-07-30 ENCOUNTER — Encounter
Admission: RE | Disposition: A | Discharge status: Home / Self Care | Payer: Self-pay | Source: Ambulatory Visit | Attending: Ophthalmology

## 2016-07-30 DIAGNOSIS — H2512 Age-related nuclear cataract, left eye: Secondary | ICD-10-CM | POA: Insufficient documentation

## 2016-07-30 DIAGNOSIS — Z79899 Other long term (current) drug therapy: Secondary | ICD-10-CM | POA: Insufficient documentation

## 2016-07-30 DIAGNOSIS — H5703 Miosis: Secondary | ICD-10-CM | POA: Insufficient documentation

## 2016-07-30 DIAGNOSIS — Z87891 Personal history of nicotine dependence: Secondary | ICD-10-CM | POA: Insufficient documentation

## 2016-07-30 DIAGNOSIS — I10 Essential (primary) hypertension: Secondary | ICD-10-CM | POA: Diagnosis not present

## 2016-07-30 HISTORY — PX: CATARACT EXTRACTION W/PHACO: SHX586

## 2016-07-30 SURGERY — PHACOEMULSIFICATION, CATARACT, WITH IOL INSERTION
Anesthesia: Monitor Anesthesia Care | Laterality: Left | Wound class: Clean

## 2016-07-30 MED ORDER — FENTANYL CITRATE (PF) 100 MCG/2ML IJ SOLN
INTRAMUSCULAR | Status: DC | PRN
  Administered 2016-07-30: 50 ug via INTRAVENOUS

## 2016-07-30 MED ORDER — BRIMONIDINE TARTRATE-TIMOLOL 0.2-0.5 % OP SOLN
OPHTHALMIC | Status: DC | PRN
  Administered 2016-07-30: 1 [drp] via OPHTHALMIC

## 2016-07-30 MED ORDER — MOXIFLOXACIN HCL 0.5 % OP SOLN
1.0000 [drp] | OPHTHALMIC | Status: DC | PRN
  Administered 2016-07-30 (×3): 1 [drp] via OPHTHALMIC

## 2016-07-30 MED ORDER — OXYCODONE HCL 5 MG PO TABS: 5.0000 mg | ORAL_TABLET | Freq: Once | ORAL | Status: DC | PRN

## 2016-07-30 MED ORDER — LACTATED RINGERS IV SOLN: INTRAVENOUS | Status: DC

## 2016-07-30 MED ORDER — ARMC OPHTHALMIC DILATING DROPS
1.0000 "application " | OPHTHALMIC | Status: DC | PRN
  Administered 2016-07-30 (×3): 1 via OPHTHALMIC

## 2016-07-30 MED ORDER — BALANCED SALT IO SOLN
INTRAOCULAR | Status: DC | PRN
  Administered 2016-07-30: 1 mL via OPHTHALMIC

## 2016-07-30 MED ORDER — CEFUROXIME OPHTHALMIC INJECTION 1 MG/0.1 ML
INJECTION | OPHTHALMIC | Status: DC | PRN
  Administered 2016-07-30: 0.1 mL via INTRACAMERAL

## 2016-07-30 MED ORDER — NA HYALUR & NA CHOND-NA HYALUR 0.4-0.35 ML IO KIT
PACK | INTRAOCULAR | Status: DC | PRN
  Administered 2016-07-30: 1 mL via INTRAOCULAR

## 2016-07-30 MED ORDER — OXYCODONE HCL 5 MG/5ML PO SOLN: 5.0000 mg | Freq: Once | ORAL | Status: DC | PRN

## 2016-07-30 MED ORDER — MIDAZOLAM HCL 2 MG/2ML IJ SOLN
INTRAMUSCULAR | Status: DC | PRN
  Administered 2016-07-30: 1 mg via INTRAVENOUS

## 2016-07-30 MED ORDER — EPINEPHRINE PF 1 MG/ML IJ SOLN
INTRAMUSCULAR | Status: DC | PRN
  Administered 2016-07-30: 70 mL via OPHTHALMIC

## 2016-07-30 SURGICAL SUPPLY — 22 items
APPLICATOR COTTON TIP 3IN (MISCELLANEOUS) ×3 IMPLANT
CANNULA ANT/CHMB 27GA (MISCELLANEOUS) ×3 IMPLANT
DISSECTOR HYDRO NUCLEUS 50X22 (MISCELLANEOUS) ×3 IMPLANT
GLOVE BIO SURGEON STRL SZ7 (GLOVE) ×3 IMPLANT
GLOVE SURG LX 6.5 MICRO (GLOVE) ×2
GLOVE SURG LX STRL 6.5 MICRO (GLOVE) ×1 IMPLANT
GOWN STRL REUS W/ TWL LRG LVL3 (GOWN DISPOSABLE) ×2 IMPLANT
GOWN STRL REUS W/TWL LRG LVL3 (GOWN DISPOSABLE) ×4
LENS IOL ACRYSOF IQ 21.0 (Intraocular Lens) ×3 IMPLANT
MARKER SKIN DUAL TIP RULER LAB (MISCELLANEOUS) ×3 IMPLANT
PACK CATARACT BRASINGTON (MISCELLANEOUS) ×3 IMPLANT
PACK EYE AFTER SURG (MISCELLANEOUS) ×3 IMPLANT
PACK OPTHALMIC (MISCELLANEOUS) ×3 IMPLANT
RING MALYGIN 7.0 (MISCELLANEOUS) ×3 IMPLANT
SUT ETHILON 10-0 CS-B-6CS-B-6 (SUTURE)
SUT VICRYL  9 0 (SUTURE)
SUT VICRYL 9 0 (SUTURE) IMPLANT
SUTURE EHLN 10-0 CS-B-6CS-B-6 (SUTURE) IMPLANT
SYR TB 1ML LUER SLIP (SYRINGE) ×3 IMPLANT
WATER STERILE IRR 250ML POUR (IV SOLUTION) ×3 IMPLANT
WICK EYE OCUCEL (MISCELLANEOUS) IMPLANT
WIPE NON LINTING 3.25X3.25 (MISCELLANEOUS) ×3 IMPLANT

## 2016-07-30 NOTE — Anesthesia Postprocedure Evaluation (Signed)
Anesthesia Post Note  Patient: Yvette Thomas  Procedure(s) Performed: Procedure(s) (LRB): CATARACT EXTRACTION PHACO AND INTRAOCULAR LENS PLACEMENT (IOC) (Left)  Patient location during evaluation: PACU Anesthesia Type: MAC Level of consciousness: awake Pain management: pain level controlled Vital Signs Assessment: post-procedure vital signs reviewed and stable Respiratory status: spontaneous breathing Cardiovascular status: blood pressure returned to baseline Postop Assessment: no headache Anesthetic complications: no    Jaci Standard, III,  Gaylynn Seiple D

## 2016-07-30 NOTE — Op Note (Signed)
Date of Surgery: 07/30/2016  PREOPERATIVE DIAGNOSES: Visually significant nuclear sclerotic cataract, left eye.  POSTOPERATIVE DIAGNOSES: Same  PROCEDURES PERFORMED: Cataract extraction with intraocular lens implant, left eye with aid of Malyugin ring.  SURGEON: Almon Hercules, M.D.  ANESTHESIA: MAC and topical  IMPLANTS: [id number] +21.0 D  Implant Name Type Inv. Item Serial No. Manufacturer Lot No. LRB No. Used  LENS IOL ACRYSOF IQ 21.0 - [id number] Intraocular Lens LENS IOL ACRYSOF IQ 21.0 [id number] ALCON   Left 1    COMPLICATIONS: Intraoperative miosis.  DESCRIPTION OF PROCEDURE: Therapeutic options were discussed with the patient preoperatively, including a discussion of risks and benefits of surgery. Informed consent was obtained. An IOL-Master and immersion biometry were used to take the lens measurements, and a dilated fundus exam was performed within 6 months of the surgical date.  The patient was premedicated and brought to the operating room and placed on the operating table in the supine position. After adequate anesthesia, the patient was prepped and draped in the usual sterile ophthalmic fashion. A wire lid speculum was inserted and the microscope was positioned. A Superblade was used to create a paracentesis site at the limbus and a small amount of dilute preservative free lidocaine was instilled into the anterior chamber, followed by dispersive viscoelastic. A clear corneal incision was created temporally using a 2.4 mm keratome blade. The pupil diameter was no more than 28m and as such the decision was made to place a Malyugin ring.  Capsulorrhexis was then performed. In situ phacoemulsification was performed.  Cortical material was removed with the irrigation-aspiration unit. Dispersive viscoelastic was instilled to open the capsular bag. A posterior chamber intraocular lens with the specifications above was inserted and positioned. Irrigation-aspiration was used to remove all  viscoelastic. Cefuroxime 1cc was instilled into the anterior chamber, and the corneal incision was checked and found to be water tight. The eyelid speculum was removed.  The operative eye was covered with protective goggles after instilling 1 drop of Combigan. The patient tolerated the procedure well. There were no complications.

## 2016-07-30 NOTE — H&P (Signed)
H+P reviewed and is up to date, please see paper chart.  

## 2016-07-30 NOTE — Transfer of Care (Signed)
Immediate Anesthesia Transfer of Care Note  Patient: Yvette Thomas  Procedure(s) Performed: Procedure(s) with comments: CATARACT EXTRACTION PHACO AND INTRAOCULAR LENS PLACEMENT (IOC) (Left) - Left  Patient Location: PACU  Anesthesia Type: MAC  Level of Consciousness: awake, alert  and patient cooperative  Airway and Oxygen Therapy: Patient Spontanous Breathing and Patient connected to supplemental oxygen  Post-op Assessment: Post-op Vital signs reviewed, Patient's Cardiovascular Status Stable, Respiratory Function Stable, Patent Airway and No signs of Nausea or vomiting  Post-op Vital Signs: Reviewed and stable  Complications: No apparent anesthesia complications

## 2016-07-30 NOTE — Anesthesia Preprocedure Evaluation (Signed)
Anesthesia Evaluation  Patient identified by MRN, date of birth, ID band Patient awake    Reviewed: Allergy & Precautions, H&P , NPO status , Patient's Chart, lab work & pertinent test results  Airway Mallampati: II   Neck ROM: full    Dental   Pulmonary former smoker,    Pulmonary exam normal        Cardiovascular hypertension, On Medications Normal cardiovascular exam     Neuro/Psych    GI/Hepatic Neg liver ROS, Medicated,  Endo/Other  negative endocrine ROS  Renal/GU      Musculoskeletal   Abdominal   Peds  Hematology negative hematology ROS (+)   Anesthesia Other Findings   Reproductive/Obstetrics                             Anesthesia Physical Anesthesia Plan  ASA: II  Anesthesia Plan: MAC   Post-op Pain Management:    Induction:   Airway Management Planned:   Additional Equipment:   Intra-op Plan:   Post-operative Plan:   Informed Consent: I have reviewed the patients History and Physical, chart, labs and discussed the procedure including the risks, benefits and alternatives for the proposed anesthesia with the patient or authorized representative who has indicated his/her understanding and acceptance.     Plan Discussed with:   Anesthesia Plan Comments:         Anesthesia Quick Evaluation

## 2016-07-31 ENCOUNTER — Encounter: Payer: Self-pay | Admitting: Ophthalmology

## 2016-08-28 DIAGNOSIS — Z961 Presence of intraocular lens: Secondary | ICD-10-CM | POA: Diagnosis not present

## 2016-08-29 ENCOUNTER — Telehealth: Payer: Self-pay | Admitting: Cardiovascular Disease

## 2016-08-29 ENCOUNTER — Telehealth: Payer: Self-pay

## 2016-08-29 MED ORDER — SERTRALINE HCL 50 MG PO TABS: 50.0000 mg | ORAL_TABLET | Freq: Every day | ORAL | 0 refills | Status: DC

## 2016-08-29 NOTE — Telephone Encounter (Signed)
Pharmacy calling to get clear instructions on medication  The medication is Doxazosin  Pt is confused as to how she is to take it We changed the dose to one a day and previously it was given to her by Dr Alphonsus SiasLetvak and she was taking two a day They are just making sure what she is to be taking  She has been take two a day Please advise

## 2016-08-29 NOTE — Telephone Encounter (Signed)
The doxazosin was prescribed by Dr Mariah MillingGollan. As far as I know, it should be once a day. I do believe she is on the sertraline

## 2016-08-29 NOTE — Telephone Encounter (Signed)
Christy with [facility] calling to verify instructions of doxazosin. Med list has Doxazosin 2 mg taking 1 tab po daily dated 05/09/16. The office note on 05/09/16 at [facility] has Doxazosin 2 mg taking 1 tab po bid. Neysa BonitoChristy wants to confirm how pt is to be taking med. Also pt is not sure if she is supposed to be taking Sertraline or not. Med list has Sertraline 50 mg taking one daily with # 90 on 05/03/16.Neysa BonitoChristy said that pt seems to be acting confused today. Christy request cb on 08/30/16.

## 2016-08-29 NOTE — Telephone Encounter (Signed)
Spoke to pharmacist and advised per Dr Alphonsus SiasLetvak. Per Wynona Caneshristine, new Rx for zoloft needed; sent as requested

## 2016-08-30 NOTE — Telephone Encounter (Signed)
Spoke to Dr Alphonsus SiasLetvak and advised I had spoken to pharmacy on 2/28 and advised med is to be taken once daily. Per Dr Alphonsus SiasLetvak, additional call not required.

## 2016-08-30 NOTE — Telephone Encounter (Signed)
We have not seen pt in almost a year. I am sending this to Dr. Alphonsus SiasLetvak, as he prescribed and she has seen him. Please let us know if we can be of any assistance.

## 2016-08-30 NOTE — Telephone Encounter (Signed)
I can guarantee that I didn't originally prescribe it---though I was likely the one to tell her to cut it down to once a day (as it frequently can cause dizziness) Please clarify with the pharmacy

## 2016-08-30 NOTE — Telephone Encounter (Signed)
Please advise pt confusion about Doxazosin instructions.

## 2016-10-01 ENCOUNTER — Telehealth: Payer: Self-pay | Admitting: Cardiovascular Disease

## 2016-10-01 NOTE — Telephone Encounter (Signed)
Attempted to schedule fu from recall list. Patient says she is not going to schedule heart is doing fine and will call if needed.  Deleting recall.

## 2016-11-12 ENCOUNTER — Ambulatory Visit (INDEPENDENT_AMBULATORY_CARE_PROVIDER_SITE_OTHER): Payer: PPO | Admitting: Internal Medicine

## 2016-11-12 ENCOUNTER — Encounter: Payer: Self-pay | Admitting: Internal Medicine

## 2016-11-12 VITALS — BP 130/70 | HR 68 | Temp 97.8°F | Wt 143.0 lb

## 2016-11-12 DIAGNOSIS — Z23 Encounter for immunization: Secondary | ICD-10-CM

## 2016-11-12 DIAGNOSIS — F39 Unspecified mood [affective] disorder: Secondary | ICD-10-CM | POA: Diagnosis not present

## 2016-11-12 DIAGNOSIS — I10 Essential (primary) hypertension: Secondary | ICD-10-CM

## 2016-11-12 MED ORDER — DOXAZOSIN MESYLATE 2 MG PO TABS: 2.0000 mg | ORAL_TABLET | Freq: Every day | ORAL | 1 refills | Status: DC

## 2016-11-12 MED ORDER — BENAZEPRIL HCL 40 MG PO TABS: 40.0000 mg | ORAL_TABLET | Freq: Every day | ORAL | 1 refills | Status: AC

## 2016-11-12 MED ORDER — KETOCONAZOLE 2 % EX CREA: 1.0000 "application " | TOPICAL_CREAM | Freq: Two times a day (BID) | CUTANEOUS | 1 refills | Status: AC

## 2016-11-12 NOTE — Progress Notes (Signed)
Subjective:    Patient ID: Yvette Thomas, female    DOB: Nov 28, 1924, 83 y.o.   MRN: [id number]  HPI Here for follow up of chronic medical conditions  She did decrease the doxazosin at my instructions 6 months ago Just taking once a day now Energy levels seems better--but may be related to headache and sinus being better Still has runny nose  Some ongoing nosebleeds Hasn't gone to ENT Seem to be less serious lately--but I discussed going to ENT if they persist  Mood is better Relates to improvement in her dizziness Only slight vertigo upon getting up out of or in to bed Still with chronic anxiety  Feels wobbly Uses cane for balance  Current Outpatient Prescriptions on File Prior to Visit  Medication Sig Dispense Refill  . benazepril (LOTENSIN) 40 MG tablet Take 1 tablet (40 mg total) by mouth daily. 30 tablet 6  . Calcium Carbonate-Vitamin D (CALCIUM 600+D) 600-400 MG-UNIT per tablet Take 1 tablet by mouth 2 (two) times daily.      . Cholecalciferol (VITAMIN D-3 PO) Take 2 tablets by mouth daily.     Marland Kitchen doxazosin (CARDURA) 2 MG tablet Take 1 tablet (2 mg total) by mouth daily. 60 tablet 11  . ibuprofen (ADVIL,MOTRIN) 200 MG tablet Take 200-400 mg by mouth 2 (two) times daily as needed. For arthritis pain    . Multiple Vitamins-Minerals (CENTRUM SILVER PO) Take 1 tablet by mouth daily.      . Omega-3 Fatty Acids (FISH OIL) 1200 MG CAPS Take by mouth daily.    . Red Yeast Rice 600 MG CAPS Take 2 capsules by mouth daily.     . sertraline (ZOLOFT) 50 MG tablet Take 1 tablet (50 mg total) by mouth daily. 90 tablet 0  . Vitamins-Lipotropics (B-50 COMPLEX) TABS Take 1 tablet by mouth daily.       No current facility-administered medications on file prior to visit.     Allergies  Allergen Reactions  . Buspirone Hcl     REACTION: Tinnitis, dizzy  . Oxybutynin Chloride     REACTION: Dried up  . Tolterodine Tartrate     REACTION: Headache    Past Medical History:  Diagnosis  Date  . Allergic rhinitis, cause unspecified   . Anxiety   . Colon polyps   . Fibrocystic breast   . GERD (gastroesophageal reflux disease)   . Hyperlipidemia   . Hypertension   . Hypothyroidism   . Osteoarthritis of multiple joints   . Osteoporosis   . Pyelitis   . Syncope and collapse    Hx  . Urinary incontinence   . Vertigo     Past Surgical History:  Procedure Laterality Date  . CATARACT EXTRACTION W/PHACO Right 05/28/2016   Procedure: CATARACT EXTRACTION PHACO AND INTRAOCULAR LENS PLACEMENT (IOC);  Surgeon: Sherald Hess, MD;  Location: [facility];  Service: Ophthalmology;  Laterality: Right;  RIGHT  . CATARACT EXTRACTION W/PHACO Left 07/30/2016   Procedure: CATARACT EXTRACTION PHACO AND INTRAOCULAR LENS PLACEMENT (IOC);  Surgeon: Sherald Hess, MD;  Location: [facility];  Service: Ophthalmology;  Laterality: Left;  Left  . THYROIDECTOMY, PARTIAL  1996    Family History  Problem Relation Age of Onset  . Hypertension Sister   . Melanoma Sister   . Heart disease Mother   . Heart disease Father   . Hypertension Brother     Social History   Social History  . Marital status: Widowed  Spouse name: N/A  . Number of children: 2  . Years of education: N/A   Occupational History  . RETIRED     [employer]   Social History Main Topics  . Smoking status: Former Smoker    Years: 25.00    Quit date: 07/02/1968  . Smokeless tobacco: Never Used  . Alcohol use No  . Drug use: No  . Sexual activity: Not on file   Other Topics Concern  . Not on file   Social History Narrative   Has living will    Daughter is health care POA.   Would accept resuscitation attempts but no prolonged artificial ventilation.   No tube feedings if cognitively unaware   Review of Systems Did finally get cataract done---vision is great Sleeps okay Appetite is fine No reflux Still feels anxious some Chronic diarrhea--takes imodium  occasionally (like once a week) Chronic back pain--discussed trying pool exercise    Objective:   Physical Exam  Constitutional: No distress.  Neck: No thyromegaly present.  Cardiovascular: Normal rate, regular rhythm and normal heart sounds.  Exam reveals no gallop.   No murmur heard. Pulmonary/Chest: Effort normal and breath sounds normal. No respiratory distress. She has no wheezes. She has no rales.  Abdominal: She exhibits no distension. There is no tenderness. There is no rebound and no guarding.  Musculoskeletal: She exhibits no edema.  Lymphadenopathy:    She has no cervical adenopathy.  Psychiatric: She has a normal mood and affect. Her behavior is normal.          Assessment & Plan:

## 2016-11-12 NOTE — Assessment & Plan Note (Signed)
Chronic anxiety Will continue the low dose sertraline

## 2016-11-12 NOTE — Patient Instructions (Signed)
If your nosebleeds continue, please set up an appointment with an ENT doctor.

## 2016-11-12 NOTE — Assessment & Plan Note (Signed)
BP Readings from Last 3 Encounters:  11/12/16 130/70  07/30/16 (!) 175/60  07/10/16 126/64   Less fatigue or bad feeling on the reduced dose of doxazosin Will continue current meds

## 2016-11-12 NOTE — Addendum Note (Signed)
Addended by: Eual FinesBRIDGES, SHANNON P on: 11/12/2016 04:46 PM   Modules accepted: Orders

## 2016-11-30 ENCOUNTER — Other Ambulatory Visit: Payer: Self-pay

## 2016-11-30 MED ORDER — SERTRALINE HCL 50 MG PO TABS: 50.0000 mg | ORAL_TABLET | Freq: Every day | ORAL | 1 refills | Status: DC

## 2016-11-30 NOTE — Telephone Encounter (Signed)
V/M left wanting to know what Dr Alphonsus SiasLetvak wants pt to take for runny nose and what can pt take for dizziness. Request refill zoloft; seen 11/12/16; refilled zoloft per protocol.[facility].

## 2016-11-30 NOTE — Telephone Encounter (Signed)
If she has runny nose from allergy, she can try loratadine 10mg  daily. Rx for dizziness depends on what is causing it. Does it seem like vertigo, or when she stands up??

## 2016-11-30 NOTE — Telephone Encounter (Signed)
She said the dizziness happens when she gets up. She was diagnosed with vertigo several years ago and took meclizine, but it made her sleepy. Said the room can spin when she is laying down at times.

## 2016-12-02 NOTE — Telephone Encounter (Signed)
Any medicine for vertigo can make her tired. She can try 1/2 meclizine up to tid and see if that helps without the sig sedatioin

## 2016-12-03 MED ORDER — MECLIZINE HCL 25 MG PO TABS: 25.0000 mg | ORAL_TABLET | Freq: Three times a day (TID) | ORAL | 0 refills | Status: AC | PRN

## 2016-12-03 NOTE — Telephone Encounter (Signed)
Spoke to pt. She said she needs a new rx for meclizine because hers is 83 years old.

## 2017-02-26 DIAGNOSIS — H04123 Dry eye syndrome of bilateral lacrimal glands: Secondary | ICD-10-CM | POA: Diagnosis not present

## 2017-03-07 ENCOUNTER — Other Ambulatory Visit: Payer: Self-pay | Admitting: Internal Medicine

## 2017-05-21 ENCOUNTER — Ambulatory Visit (INDEPENDENT_AMBULATORY_CARE_PROVIDER_SITE_OTHER): Payer: PPO | Admitting: Internal Medicine

## 2017-05-21 ENCOUNTER — Encounter: Payer: Self-pay | Admitting: Internal Medicine

## 2017-05-21 VITALS — BP 146/86 | HR 59 | Temp 97.4°F | Ht 59.25 in | Wt 143.0 lb

## 2017-05-21 DIAGNOSIS — Z23 Encounter for immunization: Secondary | ICD-10-CM | POA: Diagnosis not present

## 2017-05-21 DIAGNOSIS — F39 Unspecified mood [affective] disorder: Secondary | ICD-10-CM | POA: Diagnosis not present

## 2017-05-21 DIAGNOSIS — M150 Primary generalized (osteo)arthritis: Secondary | ICD-10-CM | POA: Diagnosis not present

## 2017-05-21 DIAGNOSIS — I10 Essential (primary) hypertension: Secondary | ICD-10-CM

## 2017-05-21 DIAGNOSIS — Z0000 Encounter for general adult medical examination without abnormal findings: Secondary | ICD-10-CM

## 2017-05-21 DIAGNOSIS — Z7189 Other specified counseling: Secondary | ICD-10-CM | POA: Diagnosis not present

## 2017-05-21 DIAGNOSIS — M159 Polyosteoarthritis, unspecified: Secondary | ICD-10-CM

## 2017-05-21 LAB — COMPREHENSIVE METABOLIC PANEL
ALBUMIN: 4 g/dL (ref 3.5–5.2)
ALT: 13 U/L (ref 0–35)
AST: 17 U/L (ref 0–37)
Alkaline Phosphatase: 57 U/L (ref 39–117)
BUN: 25 mg/dL — AB (ref 6–23)
CHLORIDE: 107 meq/L (ref 96–112)
CO2: 29 meq/L (ref 19–32)
Calcium: 10 mg/dL (ref 8.4–10.5)
Creatinine, Ser: 0.84 mg/dL (ref 0.40–1.20)
GFR: 67.57 mL/min (ref >60.00)
Glucose, Bld: 99 mg/dL (ref 70–99)
POTASSIUM: 4.5 meq/L (ref 3.5–5.1)
SODIUM: 141 meq/L (ref 135–145)
Total Bilirubin: 0.6 mg/dL (ref 0.2–1.2)
Total Protein: 6.6 g/dL (ref 6.0–8.3)

## 2017-05-21 LAB — CBC
HEMATOCRIT: 39.7 % (ref 36.0–46.0)
HEMOGLOBIN: 13.3 g/dL (ref 12.0–15.0)
MCHC: 33.5 g/dL (ref 30.0–36.0)
MCV: 92 fl (ref 78.0–100.0)
Platelets: 165 10*3/uL (ref 150.0–400.0)
RBC: 4.32 Mil/uL (ref 3.87–5.11)
RDW: 13.5 % (ref 11.5–15.5)
WBC: 5.6 10*3/uL (ref 4.0–10.5)

## 2017-05-21 NOTE — Assessment & Plan Note (Signed)
See social history 

## 2017-05-21 NOTE — Progress Notes (Signed)
Hearing Screening (Inadequate exam)   Method: Audiometry   125Hz  250Hz  500Hz  1000Hz  2000Hz  3000Hz  4000Hz  6000Hz  8000Hz   Right ear:   0 0 40  0    Left ear:   40 0 40  0    Vision Screening Comments: January 2018

## 2017-05-21 NOTE — Assessment & Plan Note (Signed)
Uses occ NSAID Will check renal function

## 2017-05-21 NOTE — Addendum Note (Signed)
Addended by: Eual FinesBRIDGES, SHANNON P on: 05/21/2017 11:51 AM   Modules accepted: Orders

## 2017-05-21 NOTE — Assessment & Plan Note (Signed)
Mild chronic dysthymia and anxiety Okay on low dose sertraline

## 2017-05-21 NOTE — Assessment & Plan Note (Signed)
I have personally reviewed the Medicare Annual Wellness questionnaire and have noted 1. The patient's medical and social history 2. Their use of alcohol, tobacco or illicit drugs 3. Their current medications and supplements 4. The patient's functional ability including ADL's, fall risks, home safety risks and hearing or visual             impairment. 5. Diet and physical activities 6. Evidence for depression or mood disorders  The patients weight, height, BMI and visual acuity have been recorded in the chart I have made referrals, counseling and provided education to the patient based review of the above and I have provided the pt with a written personalized care plan for preventive services.  I have provided you with a copy of your personalized plan for preventive services. Please take the time to review along with your updated medication list.  Due for flu vaccine No cancer screening due to age Discussed safety

## 2017-05-21 NOTE — Assessment & Plan Note (Signed)
BP Readings from Last 3 Encounters:  05/21/17 (!) 146/86  11/12/16 130/70  07/30/16 (!) 175/60   Acceptable control given past dizziness, etc Will check labs

## 2017-05-21 NOTE — Progress Notes (Signed)
Subjective:    Patient ID: Yvette Thomas, female    DOB: 08-May-1925, 83 y.o.   MRN: [id number]  HPI Here with daughter for Medicare wellness visit and follow up of chronic health conditions Reviewed form and advanced directives Reviewed other doctors No alcohol or tobacco Does do regular exercises--resistance bands Some balance problems. 1 fall without injury (other than minor scratches) Does have a alert she wears--didn't need it Vision is okay Hearing is not great---not ready for aide Some memory problems--daughter not concerned though  Ongoing back, neck and shoulder pain Uses advil 200 or aleve 220 with some relief (just prn but usually daily in the morning) Ongoing kyphosis--she feels it is worse Continues to have to rest after some of her chores Still drives and does all instrumental ADLs Once a month housekeeper  Has ongoing urinary incontinence--highest pad now Doesn't restrict her  BP monitor is broken Some headaches-- no change (sometimes in AM) No chest pain Occasional feeling of "bubble" in chest---no palpitations of note No recent dizziness. No syncope  Mood is okay Mild anxiety and dysthymia--mostly controlled Occasional down days--not anhedonic (often related to back)  Current Outpatient Medications on File Prior to Visit  Medication Sig Dispense Refill  . benazepril (LOTENSIN) 40 MG tablet Take 1 tablet (40 mg total) by mouth daily. 90 tablet 1  . Calcium Carbonate-Vitamin D (CALCIUM 600+D) 600-400 MG-UNIT per tablet Take 1 tablet by mouth 2 (two) times daily.      . Cholecalciferol (VITAMIN D-3 PO) Take 2 tablets by mouth daily.     Marland Kitchen. doxazosin (CARDURA) 2 MG tablet Take 1 tablet (2 mg total) by mouth daily. 90 tablet 1  . ibuprofen (ADVIL,MOTRIN) 200 MG tablet Take 200-400 mg by mouth 2 (two) times daily as needed. For arthritis pain    . ketoconazole (NIZORAL) 2 % cream Apply 1 application topically 2 (two) times daily. 30 g 1  . meclizine (ANTIVERT)  25 MG tablet Take 1 tablet (25 mg total) by mouth 3 (three) times daily as needed for dizziness. 30 tablet 0  . Multiple Vitamins-Minerals (CENTRUM SILVER PO) Take 1 tablet by mouth daily.      . Red Yeast Rice 600 MG CAPS Take 2 capsules by mouth daily.     . sertraline (ZOLOFT) 50 MG tablet TAKE 1 TABLET BY MOUTH DAILY 90 tablet 0  . Vitamins-Lipotropics (B-50 COMPLEX) TABS Take 1 tablet by mouth daily.      . Omega-3 Fatty Acids (FISH OIL) 1200 MG CAPS Take by mouth daily.     No current facility-administered medications on file prior to visit.     Allergies  Allergen Reactions  . Buspirone Hcl     REACTION: Tinnitis, dizzy  . Oxybutynin Chloride     REACTION: Dried up  . Tolterodine Tartrate     REACTION: Headache    Past Medical History:  Diagnosis Date  . Allergic rhinitis, cause unspecified   . Anxiety   . Colon polyps   . Fibrocystic breast   . GERD (gastroesophageal reflux disease)   . Hyperlipidemia   . Hypertension   . Hypothyroidism   . Osteoarthritis of multiple joints   . Osteoporosis   . Pyelitis   . Syncope and collapse    Hx  . Urinary incontinence   . Vertigo     Past Surgical History:  Procedure Laterality Date  . CATARACT EXTRACTION PHACO AND INTRAOCULAR LENS PLACEMENT (IOC) Left 07/30/2016   Performed by Sherald HessVin-Parikh, Anita Prakash,  [facility]  . CATARACT EXTRACTION PHACO AND INTRAOCULAR LENS PLACEMENT (IOC) Right 05/28/2016   Performed by Sherald HessVin-Parikh, Anita Prakash, MD [facility]  . THYROIDECTOMY, PARTIAL  1996    Family History  Problem Relation Age of Onset  . Hypertension Sister   . Melanoma Sister   . Heart disease Mother   . Heart disease Father   . Hypertension Brother     Social History   Socioeconomic History  . Marital status: Widowed    Spouse name: Not on file  . Number of children: 2  . Years of education: Not on file  . Highest education level: Not on file  Social Needs  . Financial resource  strain: Not on file  . Food insecurity - worry: Not on file  . Food insecurity - inability: Not on file  . Transportation needs - medical: Not on file  . Transportation needs - non-medical: Not on file  Occupational History  . Occupation: RETIRED    Comment: [employer]  Tobacco Use  . Smoking status: Former Smoker    Years: 25.00    Last attempt to quit: 07/02/1968    Years since quitting: 48.9  . Smokeless tobacco: Never Used  Substance and Sexual Activity  . Alcohol use: No  . Drug use: No  . Sexual activity: Not on file  Other Topics Concern  . Not on file  Social History Narrative   Has living will    Daughter is health care POA.   Would accept resuscitation attempts but no prolonged artificial ventilation.   No tube feedings if cognitively unaware   Review of Systems Doesn't sleep well--dozes on and off on sofa, etc Energy levels are okay Appetite is good Weight is down 6# in past year-- and 13# in 2 years Diarrhea much of the year--- better now. No blood Using allergy pill for sinuses Wears seat belt Teeth okay--keeps up with dentist No skin issues--crusty moles. No dermatologist Only rare heartburn--uses tums at times. No dysphagia Nosebleeds in winter--uses ayr then    Objective:   Physical Exam  Constitutional: She is oriented to person, place, and time. She appears well-developed. No distress.  HENT:  Mouth/Throat: Oropharynx is clear and moist. No oropharyngeal exudate.  Neck: No thyromegaly present.  Cardiovascular: Normal rate, regular rhythm, normal heart sounds and intact distal pulses. Exam reveals no gallop.  No murmur heard. Pulmonary/Chest: Effort normal and breath sounds normal. No respiratory distress. She has no wheezes. She has no rales.  Abdominal: Soft. There is no tenderness.  Musculoskeletal: She exhibits no edema or tenderness.  Kyphoscoliosis is significant  Lymphadenopathy:    She has no cervical adenopathy.  Neurological: She  is alert and oriented to person, place, and time.  President-- "Garnet Koyanagionald Trump, Obama, Bush" 100-93- "I can't do math" D-l-r-o-w Recall 3/3  Skin: No rash noted. No erythema.  Psychiatric: She has a normal mood and affect. Her behavior is normal.          Assessment & Plan:

## 2017-07-04 ENCOUNTER — Other Ambulatory Visit: Payer: Self-pay | Admitting: Internal Medicine

## 2017-07-12 ENCOUNTER — Encounter: Payer: Self-pay | Admitting: Emergency Medicine

## 2017-07-12 ENCOUNTER — Emergency Department: Payer: PPO

## 2017-07-12 ENCOUNTER — Other Ambulatory Visit: Payer: Self-pay

## 2017-07-12 ENCOUNTER — Emergency Department
Admission: EM | Admit: 2017-07-12 | Discharge: 2017-07-12 | Disposition: A | Discharge status: Home / Self Care | Payer: PPO | Source: Home / Self Care | Attending: Emergency Medicine | Admitting: Emergency Medicine

## 2017-07-12 DIAGNOSIS — A419 Sepsis, unspecified organism: Secondary | ICD-10-CM | POA: Diagnosis not present

## 2017-07-12 DIAGNOSIS — J9690 Respiratory failure, unspecified, unspecified whether with hypoxia or hypercapnia: Secondary | ICD-10-CM | POA: Diagnosis not present

## 2017-07-12 DIAGNOSIS — R0603 Acute respiratory distress: Secondary | ICD-10-CM | POA: Diagnosis not present

## 2017-07-12 DIAGNOSIS — Z79899 Other long term (current) drug therapy: Secondary | ICD-10-CM | POA: Insufficient documentation

## 2017-07-12 DIAGNOSIS — E872 Acidosis: Secondary | ICD-10-CM | POA: Diagnosis present

## 2017-07-12 DIAGNOSIS — R6521 Severe sepsis with septic shock: Secondary | ICD-10-CM | POA: Diagnosis present

## 2017-07-12 DIAGNOSIS — R17 Unspecified jaundice: Secondary | ICD-10-CM | POA: Diagnosis present

## 2017-07-12 DIAGNOSIS — E890 Postprocedural hypothyroidism: Secondary | ICD-10-CM | POA: Diagnosis present

## 2017-07-12 DIAGNOSIS — M810 Age-related osteoporosis without current pathological fracture: Secondary | ICD-10-CM | POA: Diagnosis present

## 2017-07-12 DIAGNOSIS — Z7189 Other specified counseling: Secondary | ICD-10-CM | POA: Diagnosis not present

## 2017-07-12 DIAGNOSIS — R4182 Altered mental status, unspecified: Secondary | ICD-10-CM | POA: Diagnosis present

## 2017-07-12 DIAGNOSIS — J9600 Acute respiratory failure, unspecified whether with hypoxia or hypercapnia: Secondary | ICD-10-CM | POA: Diagnosis not present

## 2017-07-12 DIAGNOSIS — R34 Anuria and oliguria: Secondary | ICD-10-CM | POA: Diagnosis not present

## 2017-07-12 DIAGNOSIS — R4020 Unspecified coma: Secondary | ICD-10-CM | POA: Diagnosis not present

## 2017-07-12 DIAGNOSIS — N201 Calculus of ureter: Secondary | ICD-10-CM | POA: Diagnosis not present

## 2017-07-12 DIAGNOSIS — Z4682 Encounter for fitting and adjustment of non-vascular catheter: Secondary | ICD-10-CM | POA: Diagnosis not present

## 2017-07-12 DIAGNOSIS — M1990 Unspecified osteoarthritis, unspecified site: Secondary | ICD-10-CM | POA: Diagnosis present

## 2017-07-12 DIAGNOSIS — Z87891 Personal history of nicotine dependence: Secondary | ICD-10-CM | POA: Diagnosis not present

## 2017-07-12 DIAGNOSIS — N209 Urinary calculus, unspecified: Secondary | ICD-10-CM | POA: Diagnosis not present

## 2017-07-12 DIAGNOSIS — N200 Calculus of kidney: Secondary | ICD-10-CM

## 2017-07-12 DIAGNOSIS — Z888 Allergy status to other drugs, medicaments and biological substances status: Secondary | ICD-10-CM | POA: Diagnosis not present

## 2017-07-12 DIAGNOSIS — J9601 Acute respiratory failure with hypoxia: Secondary | ICD-10-CM | POA: Diagnosis present

## 2017-07-12 DIAGNOSIS — R0902 Hypoxemia: Secondary | ICD-10-CM | POA: Diagnosis not present

## 2017-07-12 DIAGNOSIS — G9341 Metabolic encephalopathy: Secondary | ICD-10-CM | POA: Diagnosis present

## 2017-07-12 DIAGNOSIS — R109 Unspecified abdominal pain: Secondary | ICD-10-CM | POA: Diagnosis not present

## 2017-07-12 DIAGNOSIS — N12 Tubulo-interstitial nephritis, not specified as acute or chronic: Secondary | ICD-10-CM | POA: Diagnosis not present

## 2017-07-12 DIAGNOSIS — E039 Hypothyroidism, unspecified: Secondary | ICD-10-CM

## 2017-07-12 DIAGNOSIS — E785 Hyperlipidemia, unspecified: Secondary | ICD-10-CM | POA: Diagnosis present

## 2017-07-12 DIAGNOSIS — D696 Thrombocytopenia, unspecified: Secondary | ICD-10-CM | POA: Diagnosis present

## 2017-07-12 DIAGNOSIS — R001 Bradycardia, unspecified: Secondary | ICD-10-CM | POA: Diagnosis present

## 2017-07-12 DIAGNOSIS — I10 Essential (primary) hypertension: Secondary | ICD-10-CM

## 2017-07-12 DIAGNOSIS — R42 Dizziness and giddiness: Secondary | ICD-10-CM | POA: Diagnosis present

## 2017-07-12 DIAGNOSIS — K5730 Diverticulosis of large intestine without perforation or abscess without bleeding: Secondary | ICD-10-CM | POA: Diagnosis present

## 2017-07-12 DIAGNOSIS — J9811 Atelectasis: Secondary | ICD-10-CM | POA: Diagnosis present

## 2017-07-12 DIAGNOSIS — N136 Pyonephrosis: Secondary | ICD-10-CM | POA: Diagnosis present

## 2017-07-12 DIAGNOSIS — N179 Acute kidney failure, unspecified: Secondary | ICD-10-CM | POA: Diagnosis present

## 2017-07-12 DIAGNOSIS — F0390 Unspecified dementia without behavioral disturbance: Secondary | ICD-10-CM | POA: Diagnosis present

## 2017-07-12 DIAGNOSIS — R1032 Left lower quadrant pain: Secondary | ICD-10-CM | POA: Diagnosis not present

## 2017-07-12 DIAGNOSIS — Z66 Do not resuscitate: Secondary | ICD-10-CM | POA: Diagnosis present

## 2017-07-12 DIAGNOSIS — N1330 Unspecified hydronephrosis: Secondary | ICD-10-CM | POA: Diagnosis not present

## 2017-07-12 DIAGNOSIS — R404 Transient alteration of awareness: Secondary | ICD-10-CM | POA: Diagnosis not present

## 2017-07-12 DIAGNOSIS — Z0189 Encounter for other specified special examinations: Secondary | ICD-10-CM | POA: Diagnosis not present

## 2017-07-12 DIAGNOSIS — N10 Acute tubulo-interstitial nephritis: Secondary | ICD-10-CM | POA: Diagnosis not present

## 2017-07-12 DIAGNOSIS — R6520 Severe sepsis without septic shock: Secondary | ICD-10-CM | POA: Diagnosis not present

## 2017-07-12 DIAGNOSIS — A4151 Sepsis due to Escherichia coli [E. coli]: Secondary | ICD-10-CM | POA: Diagnosis present

## 2017-07-12 DIAGNOSIS — R7881 Bacteremia: Secondary | ICD-10-CM | POA: Diagnosis not present

## 2017-07-12 DIAGNOSIS — Z515 Encounter for palliative care: Secondary | ICD-10-CM | POA: Diagnosis not present

## 2017-07-12 LAB — URINALYSIS, COMPLETE (UACMP) WITH MICROSCOPIC
Bilirubin Urine: NEGATIVE
GLUCOSE, UA: NEGATIVE mg/dL
Ketones, ur: NEGATIVE mg/dL
Leukocytes, UA: NEGATIVE
Nitrite: NEGATIVE
PH: 8.5 — AB (ref 5.0–8.0)
Protein, ur: NEGATIVE mg/dL
Specific Gravity, Urine: 1.02 (ref 1.005–1.030)
WBC, UA: NONE SEEN WBC/hpf (ref 0–5)

## 2017-07-12 LAB — CBC
HEMATOCRIT: 39.3 % (ref 35.0–47.0)
HEMOGLOBIN: 13.2 g/dL (ref 12.0–16.0)
MCH: 30.7 pg (ref 26.0–34.0)
MCHC: 33.6 g/dL (ref 32.0–36.0)
MCV: 91.2 fL (ref 80.0–100.0)
Platelets: 139 10*3/uL — ABNORMAL LOW (ref 150–440)
RBC: 4.3 MIL/uL (ref 3.80–5.20)
RDW: 13.8 % (ref 11.5–14.5)
WBC: 11.9 10*3/uL — ABNORMAL HIGH (ref 3.6–11.0)

## 2017-07-12 LAB — BASIC METABOLIC PANEL
Anion gap: 9 (ref 5–15)
BUN: 17 mg/dL (ref 6–20)
CHLORIDE: 106 mmol/L (ref 101–111)
CO2: 25 mmol/L (ref 22–32)
CREATININE: 0.99 mg/dL (ref 0.44–1.00)
Calcium: 9.3 mg/dL (ref 8.9–10.3)
GFR calc non Af Amer: 49 mL/min — ABNORMAL LOW (ref >60)
GFR, EST AFRICAN AMERICAN: 56 mL/min — AB (ref >60)
Glucose, Bld: 155 mg/dL — ABNORMAL HIGH (ref 65–99)
POTASSIUM: 4.1 mmol/L (ref 3.5–5.1)
Sodium: 140 mmol/L (ref 135–145)

## 2017-07-12 MED ORDER — MORPHINE SULFATE (PF) 2 MG/ML IV SOLN
INTRAVENOUS | Status: AC
  Filled 2017-07-12: qty 1

## 2017-07-12 MED ORDER — MORPHINE SULFATE (PF) 2 MG/ML IV SOLN
2.0000 mg | Freq: Once | INTRAVENOUS | Status: AC
Start: 2017-07-12 — End: 2017-07-12
  Administered 2017-07-12: 2 mg via INTRAVENOUS

## 2017-07-12 MED ORDER — FENTANYL CITRATE (PF) 100 MCG/2ML IJ SOLN
25.0000 ug | Freq: Once | INTRAMUSCULAR | Status: AC
  Administered 2017-07-12: 25 ug via INTRAVENOUS
  Filled 2017-07-12: qty 2

## 2017-07-12 MED ORDER — ONDANSETRON HCL 4 MG/2ML IJ SOLN
4.0000 mg | Freq: Once | INTRAMUSCULAR | Status: AC
  Administered 2017-07-12: 4 mg via INTRAVENOUS
  Filled 2017-07-12: qty 2

## 2017-07-12 MED ORDER — OXYCODONE HCL 5 MG PO TABS: 2.5000 mg | ORAL_TABLET | ORAL | 0 refills | Status: AC | PRN

## 2017-07-12 MED ORDER — KETOROLAC TROMETHAMINE 30 MG/ML IJ SOLN
15.0000 mg | Freq: Once | INTRAMUSCULAR | Status: AC
  Administered 2017-07-12: 15 mg via INTRAVENOUS
  Filled 2017-07-12: qty 1

## 2017-07-12 MED ORDER — ONDANSETRON 4 MG PO TBDP: 4.0000 mg | ORAL_TABLET | Freq: Three times a day (TID) | ORAL | 0 refills | Status: AC | PRN

## 2017-07-12 MED ORDER — ACETAMINOPHEN 500 MG PO TABS
1000.0000 mg | ORAL_TABLET | Freq: Once | ORAL | Status: AC
  Administered 2017-07-12: 1000 mg via ORAL
  Filled 2017-07-12: qty 2

## 2017-07-12 MED ORDER — ACETAMINOPHEN 500 MG PO TABS: 1000.0000 mg | ORAL_TABLET | Freq: Three times a day (TID) | ORAL | 0 refills | Status: AC

## 2017-07-12 MED ORDER — OXYCODONE HCL 5 MG PO TABS
5.0000 mg | ORAL_TABLET | Freq: Once | ORAL | Status: AC
  Administered 2017-07-12: 5 mg via ORAL
  Filled 2017-07-12: qty 1

## 2017-07-12 MED ORDER — TAMSULOSIN HCL 0.4 MG PO CAPS
0.4000 mg | ORAL_CAPSULE | Freq: Once | ORAL | Status: AC
  Administered 2017-07-12: 0.4 mg via ORAL
  Filled 2017-07-12: qty 1

## 2017-07-12 MED ORDER — SODIUM CHLORIDE 0.9 % IV BOLUS (SEPSIS)
500.0000 mL | Freq: Once | INTRAVENOUS | Status: AC
  Administered 2017-07-12: 500 mL via INTRAVENOUS

## 2017-07-12 MED ORDER — TAMSULOSIN HCL 0.4 MG PO CAPS: 0.4000 mg | ORAL_CAPSULE | Freq: Every day | ORAL | 0 refills | Status: AC

## 2017-07-12 NOTE — ED Provider Notes (Signed)
[facility] Emergency Department Provider Note  ____________________________________________  Time seen: Approximately 10:41 AM  [facility] vital signs and the nursing notes.   HISTORY  Chief Complaint Flank Pain and Nausea   HPI Yvette Thomas is a 84 y.o. female with a history of hypertension, hyperlipidemia, and urinary incontinence and presents for evaluation of left flank pain. Patient reports sudden onset of sharp severe 9/10 pain in her left flank radiating to her left abdomen associated with nausea that started at 2 AM and woke her up from her sleep. She took 2 Aleve at home and the pain is currently 7 out of 10. Patient denies prior history of kidney stones, dysuria, frequency, or hematuria. No fever or chills, no nausea or vomiting. Last BM was this morning. Patient denies any numbness or weakness of her extremities, chest pain or shortness of breath.  Past Medical History:  Diagnosis Date  . Allergic rhinitis, cause unspecified   . Anxiety   . Colon polyps   . Fibrocystic breast   . GERD (gastroesophageal reflux disease)   . Hyperlipidemia   . Hypertension   . Hypothyroidism   . Osteoarthritis of multiple joints   . Osteoporosis   . Pyelitis   . Syncope and collapse    Hx  . Urinary incontinence   . Vertigo     Patient Active Problem List   Diagnosis Date Noted  . Epistaxis 07/10/2016  . Kyphoscoliosis 11/16/2015  . Advanced directives, counseling/discussion 04/27/2014  . Generalized anxiety disorder 02/19/2014  . Abnormal EKG 10/02/2013  . Hyperlipidemia 10/02/2013  . Vertigo 01/13/2013  . Routine general medical examination at a health care facility 10/13/2012  . Episodic mood disorder (HCC)   . Allergic rhinitis, cause unspecified   . Osteoarthritis of multiple joints   . URINARY INCONTINENCE 04/02/2007  . Essential hypertension 03/17/2007  . GERD 03/17/2007  . FIBROCYSTIC BREAST DISEASE 03/17/2007  .  OSTEOPOROSIS 03/17/2007  . COLONIC POLYPS, HX OF 03/17/2007    Past Surgical History:  Procedure Laterality Date  . CATARACT EXTRACTION W/PHACO Right 05/28/2016   Procedure: CATARACT EXTRACTION PHACO AND INTRAOCULAR LENS PLACEMENT (IOC);  Surgeon: Sherald HessAnita Prakash Vin-Parikh, MD;  [facility];  Service: Ophthalmology;  Laterality: Right;  RIGHT  . CATARACT EXTRACTION W/PHACO Left 07/30/2016   Procedure: CATARACT EXTRACTION PHACO AND INTRAOCULAR LENS PLACEMENT (IOC);  Surgeon: Sherald HessAnita Prakash Vin-Parikh, MD;  [facility];  Service: Ophthalmology;  Laterality: Left;  Left  . THYROIDECTOMY, PARTIAL  1996    Prior to Admission medications   Medication Sig Start Date End Date Taking? Authorizing Provider  benazepril (LOTENSIN) 40 MG tablet Take 1 tablet (40 mg total) by mouth daily. 11/12/16  Yes Karie SchwalbeLetvak, Richard I, MD  Calcium Carbonate-Vitamin D (CALCIUM 600+D) 600-400 MG-UNIT per tablet Take 1 tablet by mouth 2 (two) times daily.     Yes [provider]  Cholecalciferol (VITAMIN D-3 PO) Take 2 tablets by mouth daily.    Yes [provider]  doxazosin (CARDURA) 2 MG tablet TAKE 1 TABLET BY MOUTH DAILY 07/05/17  Yes Karie SchwalbeLetvak, Richard I, MD  ibuprofen (ADVIL,MOTRIN) 200 MG tablet Take 200-400 mg by mouth 2 (two) times daily as needed. For arthritis pain   Yes [provider]  meclizine (ANTIVERT) 25 MG tablet Take 1 tablet (25 mg total) by mouth 3 (three) times daily as needed for dizziness. 12/03/16  Yes Karie SchwalbeLetvak, Richard I, MD  Multiple Vitamins-Minerals (CENTRUM SILVER PO) Take  1 tablet by mouth daily.     Yes [provider]  Omega-3 Fatty Acids (FISH OIL) 1200 MG CAPS Take by mouth daily.   Yes [provider]  Red Yeast Rice 600 MG CAPS Take 2 capsules by mouth daily.    Yes [provider]  sertraline (ZOLOFT) 50 MG tablet TAKE 1 TABLET BY MOUTH DAILY 07/05/17  Yes Karie Schwalbe, MD  Vitamins-Lipotropics (B-50  COMPLEX) TABS Take 1 tablet by mouth daily.     Yes [provider]  acetaminophen (TYLENOL) 500 MG tablet Take 2 tablets (1,000 mg total) by mouth every 8 (eight) hours for 7 days. 07/12/17 07/19/17  Nita Sickle, MD  ketoconazole (NIZORAL) 2 % cream Apply 1 application topically 2 (two) times daily. Patient not taking: Reported on 07/12/2017 11/12/16   Karie Schwalbe, MD  ondansetron (ZOFRAN ODT) 4 MG disintegrating tablet Take 1 tablet (4 mg total) by mouth every 8 (eight) hours as needed for nausea or vomiting. 07/12/17   Don Perking, [place], MD  oxyCODONE (ROXICODONE) 5 MG immediate release tablet Take 0.5 tablets (2.5 mg total) by mouth every 4 (four) hours as needed. 07/12/17 07/12/18  Nita Sickle, MD  tamsulosin (FLOMAX) 0.4 MG CAPS capsule Take 1 capsule (0.4 mg total) by mouth daily. 07/12/17   Nita Sickle, MD    Allergies Buspirone hcl; Oxybutynin chloride; and Tolterodine tartrate  Family History  Problem Relation Age of Onset  . Hypertension Sister   . Melanoma Sister   . Heart disease Mother   . Heart disease Father   . Hypertension Brother     Social History Social History   Tobacco Use  . Smoking status: Former Smoker    Years: 25.00    Last attempt to quit: 07/02/1968    Years since quitting: 49.0  . Smokeless tobacco: Never Used  Substance Use Topics  . Alcohol use: No  . Drug use: No    Review of Systems  Constitutional: Negative for fever. Eyes: Negative for visual changes. ENT: Negative for sore throat. Neck: No neck pain  Cardiovascular: Negative for chest pain. Respiratory: Negative for shortness of breath. Gastrointestinal: Negative for abdominal pain, vomiting or diarrhea. + nausea Genitourinary: Negative for dysuria. + L flank pain Musculoskeletal: Negative for back pain. Skin: Negative for rash. Neurological: Negative for headaches, weakness or numbness. Psych: No SI or  HI  ____________________________________________   PHYSICAL EXAM:  VITAL SIGNS: ED Triage Vitals [07/12/17 1016]  Enc Vitals Group     BP (!) 173/72     Pulse Rate 65     Resp 18     Temp 98.6 F (37 C)     Temp Source Oral     SpO2 97 %     Weight 143 lb (64.9 kg)     Height 4\' 11"  (1.499 m)     Head Circumference      Peak Flow      Pain Score 8     Pain Loc      Pain Edu?      Excl. in GC?     Constitutional: Alert and oriented. Well appearing and in no apparent distress. HEENT:      Head: Normocephalic and atraumatic.         Eyes: Conjunctivae are normal. Sclera is non-icteric.       Mouth/Throat: Mucous membranes are moist.       Neck: Supple with no signs of meningismus. Cardiovascular: Regular rate and rhythm. No murmurs,  gallops, or rubs. 2+ symmetrical distal pulses are present in all extremities. No JVD. Respiratory: Normal respiratory effort. Lungs are clear to auscultation bilaterally. No wheezes, crackles, or rhonchi.  Gastrointestinal: Soft, no pulsatile mass palpable, non tender, and non distended with positive bowel sounds. No rebound or guarding. Genitourinary: No CVA tenderness. Musculoskeletal: Nontender with normal range of motion in all extremities. No edema, cyanosis, or erythema of extremities. Neurologic: Normal speech and language. Face is symmetric. Moving all extremities. No gross focal neurologic deficits are appreciated. Skin: Skin is warm, dry and intact. No rash noted. Psychiatric: Mood and affect are normal. Speech and behavior are normal.  ____________________________________________   LABS (all labs ordered are listed, but only abnormal results are displayed)  Labs Reviewed  URINALYSIS, COMPLETE (UACMP) WITH MICROSCOPIC - Abnormal; Notable for the following components:      Result Value   pH 8.5 (*)    Hgb urine dipstick TRACE (*)    Squamous Epithelial / LPF 0-5 (*)    Bacteria, UA RARE (*)    All other components within normal  limits  CBC - Abnormal; Notable for the following components:   WBC 11.9 (*)    Platelets 139 (*)    All other components within normal limits  BASIC METABOLIC PANEL - Abnormal; Notable for the following components:   Glucose, Bld 155 (*)    GFR calc non Af Amer 49 (*)    GFR calc Af Amer 56 (*)    All other components within normal limits  URINE CULTURE   ____________________________________________  EKG  none ____________________________________________  RADIOLOGY  CT renal: 7 mm proximal left ureteral stone with mild to moderate left hydronephrosis and perinephric stranding.  Left lower pole punctate nonobstructing nephrolithiasis.  Aortoiliac atherosclerosis.  Sigmoid diverticulosis.  Cardiomegaly.  ____________________________________________   PROCEDURES  Procedure(s) performed: None Procedures Critical Care performed:  None ____________________________________________   INITIAL IMPRESSION / ASSESSMENT AND PLAN / ED COURSE  84 y.o. female with a history of hypertension, hyperlipidemia, and urinary incontinence and presents for evaluation of sudden onset, sharp, severe left flank pain associated with nausea since 2 AM. Patient is well-appearing, in no distress, she is hypertensive but remaining of her vital signs are within normal limits, abdomen is soft with no tenderness throughout, no pulsatile mass palpable, extremities are warm and well perfused, no CVA tenderness, no rash. Differential diagnoses includes but not limited to kidney stone versus pyelonephritis versus AAA versus aortic dissection vs pancreatitis vs gastritis vs PUD. Will give fentanyl and zofran, IVF, check labs, UA, and send patient for CT renal protocol.   Clinical Course as of Jul 12 1426  Fri Jul 12, 2017  1223 CT consistent with a proximal 7 mm ureteral stone. We'll switch patient to by mouth meds and reassess. If pain well-controlled patient be discharged with close follow-up with urology.  UA with no evidence of overlying infection and kidney function is within normal limits.  [CV]  1347 Patient reports pain is well controlled. Will consult Urology  [CV]    Clinical Course User Index [CV] Don Perking [place], MD   _________________________ 2:12 PM on 07/12/2017 -----------------------------------------  Discussed with Dr. Ronne Binning, Urologist on call who recommended close outpatient f/u on Monday, pain control, zofran and flomax. Patient's daughter is calling Urology's office now for an appointment. Discussed strict return precautions for any signs of worsening pain, nausea, or infection such as fever or abdominal pain. At this time patient is safe for dc and outpatient close f/u. Patient  given zofran, oxycodone, tylenol, and flomax.  As part of my medical decision making, I reviewed the following data within the electronic MEDICAL RECORD NUMBER Nursing notes reviewed and incorporated, Labs reviewed , Radiograph reviewed , A consult was requested and obtained from this/these consultant(s) Urology, Notes from prior ED visits and [place] Controlled Substance Database    Pertinent labs & imaging results that were available during my care of the patient were reviewed by me and considered in my medical decision making (see chart for details).    ____________________________________________   FINAL CLINICAL IMPRESSION(S) / ED DIAGNOSES  Final diagnoses:  Kidney stone      NEW MEDICATIONS STARTED DURING THIS VISIT:  ED Discharge Orders        Ordered    oxyCODONE (ROXICODONE) 5 MG immediate release tablet  Every 4 hours PRN     07/12/17 1356    tamsulosin (FLOMAX) 0.4 MG CAPS capsule  Daily     07/12/17 1356    ondansetron (ZOFRAN ODT) 4 MG disintegrating tablet  Every 8 hours PRN     07/12/17 1356    acetaminophen (TYLENOL) 500 MG tablet  Every 8 hours     07/12/17 1356       Note:  This document was prepared using Dragon voice recognition software and may include  unintentional dictation errors.    Don Perking, [place], MD 07/12/17 [phone]

## 2017-07-12 NOTE — ED Notes (Signed)
AAOx3.  Skin warm and dry.  NAD 

## 2017-07-12 NOTE — ED Notes (Signed)
Patient transported to CT 

## 2017-07-12 NOTE — ED Triage Notes (Signed)
Pt here with daughter, c/o sharp left flank pain that started around 2am this morning. Pt also has been nauseated. Pt states the pain is constant and cannot get into a comfortable position.  Pt states she has had kidney trouble before but never a stone. Pt has hx of pyelonephritis many years ago. Pt denies any dysuria and is incontinent at times.

## 2017-07-12 NOTE — Discharge Instructions (Signed)
You have been seen in the Emergency Department (ED)  Today and was diagnosed with kidney stones. While the stone is traveling through the ureter, which is the tube that carries urine from the kidney to the bladder, you will probably feel pain. The pain may be mild or very severe. You may also have some blood in your urine. As soon as the stone reaches the bladder, any intense pain should go away. If a stone is too large to pass on its own, you may need a medical procedure to help you pass the stone.   As we have discussed, please drink plenty of fluids and use a urinary strainer to attempt to capture the stone.  Please make a follow up appointment with Urology in the next week by calling the number below and bring the stone with you.  Take tylenol 1000mg  every 8 hours for the pain. If the pain is not well controlled with tylenol you may take half oxycodone every 4 hours. Please also take your prescribed flomax daily. Take zofran as needed for nausea. If you need the oxycodone make sure to take a daily senokot to prevent constipation  Follow-up with Urology on Monday or return to the ER in 12-24 hours if your pain is not well controlled, if you develop pain or burning with urination, or if you develop a fever. Otherwise follow up in 3-5 days with your doctor.  When should you call for help?  Call your doctor now or seek immediate medical care if:  You cannot keep down fluids.  Your pain gets worse.  You have a fever or chills.  You have new or worse pain in your back just below your rib cage (the flank area).  You have new or more blood in your urine. You have pain or burning with urination You are unable to urinate You have abdominal pain  Watch closely for changes in your health, and be sure to contact your doctor if:  You do not get better as expected  How can you care for yourself at home?  Drink plenty of fluids, enough so that your urine is light yellow or clear like water. If you have  kidney, heart, or liver disease and have to limit fluids, talk with your doctor before you increase the amount of fluids you drink.  Take pain medicines exactly as directed. Call your doctor if you think you are having a problem with your medicine.  If the doctor gave you a prescription medicine for pain, take it as prescribed.  If you are not taking a prescription pain medicine, ask your doctor if you can take an over-the-counter medicine. Read and follow all instructions on the label. Your doctor may ask you to strain your urine so that you can collect your kidney stone when it passes. You can use a kitchen strainer or a tea strainer to catch the stone. Store it in a plastic bag until you see your doctor again.  Preventing future kidney stones  Some changes in your diet may help prevent kidney stones. Depending on the cause of your stones, your doctor may recommend that you:  Drink plenty of fluids, enough so that your urine is light yellow or clear like water. If you have kidney, heart, or liver disease and have to limit fluids, talk with your doctor before you increase the amount of fluids you drink.  Limit coffee, tea, and alcohol. Also avoid grapefruit juice.  Do not take more than the recommended daily  dose of vitamins C and D.  Avoid antacids such as Gaviscon, Maalox, Mylanta, or Tums.  Limit the amount of salt (sodium) in your diet.  Eat a balanced diet that is not too high in protein.  Limit foods that are high in a substance called oxalate, which can cause kidney stones. These foods include dark green vegetables, rhubarb, chocolate, wheat bran, nuts, cranberries, and beans.

## 2017-07-13 ENCOUNTER — Inpatient Hospital Stay: Payer: PPO | Admitting: Registered Nurse

## 2017-07-13 ENCOUNTER — Emergency Department: Payer: PPO

## 2017-07-13 ENCOUNTER — Inpatient Hospital Stay
Admission: EM | Admit: 2017-07-13 | Discharge: 2017-08-02 | DRG: 853 | Disposition: E | Payer: PPO | Attending: Internal Medicine | Admitting: Internal Medicine

## 2017-07-13 ENCOUNTER — Encounter: Admission: EM | Disposition: E | Payer: Self-pay | Source: Home / Self Care | Attending: Internal Medicine

## 2017-07-13 ENCOUNTER — Inpatient Hospital Stay: Payer: PPO

## 2017-07-13 DIAGNOSIS — Z0189 Encounter for other specified special examinations: Secondary | ICD-10-CM

## 2017-07-13 DIAGNOSIS — N136 Pyonephrosis: Secondary | ICD-10-CM | POA: Diagnosis present

## 2017-07-13 DIAGNOSIS — R4182 Altered mental status, unspecified: Secondary | ICD-10-CM | POA: Diagnosis present

## 2017-07-13 DIAGNOSIS — Z87891 Personal history of nicotine dependence: Secondary | ICD-10-CM | POA: Diagnosis not present

## 2017-07-13 DIAGNOSIS — D696 Thrombocytopenia, unspecified: Secondary | ICD-10-CM | POA: Diagnosis present

## 2017-07-13 DIAGNOSIS — E785 Hyperlipidemia, unspecified: Secondary | ICD-10-CM | POA: Diagnosis present

## 2017-07-13 DIAGNOSIS — R0603 Acute respiratory distress: Secondary | ICD-10-CM | POA: Diagnosis not present

## 2017-07-13 DIAGNOSIS — N201 Calculus of ureter: Secondary | ICD-10-CM

## 2017-07-13 DIAGNOSIS — R6521 Severe sepsis with septic shock: Secondary | ICD-10-CM | POA: Diagnosis present

## 2017-07-13 DIAGNOSIS — E890 Postprocedural hypothyroidism: Secondary | ICD-10-CM | POA: Diagnosis present

## 2017-07-13 DIAGNOSIS — F0390 Unspecified dementia without behavioral disturbance: Secondary | ICD-10-CM | POA: Diagnosis present

## 2017-07-13 DIAGNOSIS — J9601 Acute respiratory failure with hypoxia: Secondary | ICD-10-CM | POA: Diagnosis present

## 2017-07-13 DIAGNOSIS — E872 Acidosis: Secondary | ICD-10-CM | POA: Diagnosis present

## 2017-07-13 DIAGNOSIS — J9811 Atelectasis: Secondary | ICD-10-CM | POA: Diagnosis present

## 2017-07-13 DIAGNOSIS — J9690 Respiratory failure, unspecified, unspecified whether with hypoxia or hypercapnia: Secondary | ICD-10-CM

## 2017-07-13 DIAGNOSIS — N1330 Unspecified hydronephrosis: Secondary | ICD-10-CM

## 2017-07-13 DIAGNOSIS — Z7189 Other specified counseling: Secondary | ICD-10-CM | POA: Diagnosis not present

## 2017-07-13 DIAGNOSIS — R42 Dizziness and giddiness: Secondary | ICD-10-CM | POA: Diagnosis present

## 2017-07-13 DIAGNOSIS — N179 Acute kidney failure, unspecified: Secondary | ICD-10-CM | POA: Diagnosis present

## 2017-07-13 DIAGNOSIS — R7881 Bacteremia: Secondary | ICD-10-CM | POA: Diagnosis not present

## 2017-07-13 DIAGNOSIS — K5730 Diverticulosis of large intestine without perforation or abscess without bleeding: Secondary | ICD-10-CM | POA: Diagnosis present

## 2017-07-13 DIAGNOSIS — R6520 Severe sepsis without septic shock: Secondary | ICD-10-CM | POA: Diagnosis not present

## 2017-07-13 DIAGNOSIS — I10 Essential (primary) hypertension: Secondary | ICD-10-CM | POA: Diagnosis present

## 2017-07-13 DIAGNOSIS — Z888 Allergy status to other drugs, medicaments and biological substances status: Secondary | ICD-10-CM | POA: Diagnosis not present

## 2017-07-13 DIAGNOSIS — J9600 Acute respiratory failure, unspecified whether with hypoxia or hypercapnia: Secondary | ICD-10-CM | POA: Diagnosis not present

## 2017-07-13 DIAGNOSIS — M810 Age-related osteoporosis without current pathological fracture: Secondary | ICD-10-CM | POA: Diagnosis present

## 2017-07-13 DIAGNOSIS — A4151 Sepsis due to Escherichia coli [E. coli]: Secondary | ICD-10-CM | POA: Diagnosis present

## 2017-07-13 DIAGNOSIS — Z66 Do not resuscitate: Secondary | ICD-10-CM | POA: Diagnosis present

## 2017-07-13 DIAGNOSIS — N12 Tubulo-interstitial nephritis, not specified as acute or chronic: Secondary | ICD-10-CM | POA: Diagnosis not present

## 2017-07-13 DIAGNOSIS — M1990 Unspecified osteoarthritis, unspecified site: Secondary | ICD-10-CM | POA: Diagnosis present

## 2017-07-13 DIAGNOSIS — R34 Anuria and oliguria: Secondary | ICD-10-CM | POA: Diagnosis not present

## 2017-07-13 DIAGNOSIS — A419 Sepsis, unspecified organism: Secondary | ICD-10-CM

## 2017-07-13 DIAGNOSIS — Z515 Encounter for palliative care: Secondary | ICD-10-CM | POA: Diagnosis not present

## 2017-07-13 DIAGNOSIS — G9341 Metabolic encephalopathy: Secondary | ICD-10-CM | POA: Diagnosis present

## 2017-07-13 DIAGNOSIS — R001 Bradycardia, unspecified: Secondary | ICD-10-CM | POA: Diagnosis present

## 2017-07-13 DIAGNOSIS — N10 Acute tubulo-interstitial nephritis: Secondary | ICD-10-CM | POA: Diagnosis not present

## 2017-07-13 DIAGNOSIS — R1032 Left lower quadrant pain: Secondary | ICD-10-CM

## 2017-07-13 DIAGNOSIS — R17 Unspecified jaundice: Secondary | ICD-10-CM | POA: Diagnosis present

## 2017-07-13 HISTORY — PX: CYSTOSCOPY W/ URETERAL STENT PLACEMENT: SHX1429

## 2017-07-13 LAB — BLOOD GAS, ARTERIAL
ACID-BASE DEFICIT: 10.7 mmol/L — AB (ref 0.0–2.0)
Acid-base deficit: 8.2 mmol/L — ABNORMAL HIGH (ref 0.0–2.0)
Bicarbonate: 15.9 mmol/L — ABNORMAL LOW (ref 20.0–28.0)
Bicarbonate: 17.9 mmol/L — ABNORMAL LOW (ref 20.0–28.0)
FIO2: 1
FIO2: 0.6
LHR: 15 {breaths}/min
O2 SAT: 99.1 %
O2 Saturation: 98.9 %
PCO2 ART: 37 mmHg (ref 32.0–48.0)
PEEP: 5 cmH2O
PH ART: 7.24 — AB (ref 7.350–7.450)
PIP: 5 cmH2O
Patient temperature: 37
Patient temperature: 37
RATE: 14 resp/min
VT: 450 mL
VT: 450 mL
pCO2 arterial: 38 mmHg (ref 32.0–48.0)
pH, Arterial: 7.28 — ABNORMAL LOW (ref 7.350–7.450)
pO2, Arterial: 156 mmHg — ABNORMAL HIGH (ref 83.0–108.0)
pO2, Arterial: 140 mmHg — ABNORMAL HIGH (ref 83.0–108.0)

## 2017-07-13 LAB — TROPONIN I: Troponin I: 1.07 ng/mL (ref <0.03)

## 2017-07-13 LAB — CBC
HCT: 34.8 % — ABNORMAL LOW (ref 35.0–47.0)
HEMOGLOBIN: 11.4 g/dL — AB (ref 12.0–16.0)
MCH: 30.3 pg (ref 26.0–34.0)
MCHC: 32.7 g/dL (ref 32.0–36.0)
MCV: 92.6 fL (ref 80.0–100.0)
Platelets: 56 10*3/uL — ABNORMAL LOW (ref 150–440)
RBC: 3.75 MIL/uL — AB (ref 3.80–5.20)
RDW: 14.2 % (ref 11.5–14.5)
WBC: 19.1 10*3/uL — ABNORMAL HIGH (ref 3.6–11.0)

## 2017-07-13 LAB — CBC WITH DIFFERENTIAL/PLATELET
BASOS PCT: 0 %
Basophils Absolute: 0 10*3/uL (ref 0–0.1)
EOS ABS: 0.1 10*3/uL (ref 0–0.7)
EOS PCT: 1 %
HCT: 34.2 % — ABNORMAL LOW (ref 35.0–47.0)
Hemoglobin: 11.2 g/dL — ABNORMAL LOW (ref 12.0–16.0)
LYMPHS ABS: 0.2 10*3/uL — AB (ref 1.0–3.6)
Lymphocytes Relative: 2 %
MCH: 30.4 pg (ref 26.0–34.0)
MCHC: 32.8 g/dL (ref 32.0–36.0)
MCV: 92.5 fL (ref 80.0–100.0)
Monocytes Absolute: 0.4 10*3/uL (ref 0.2–0.9)
Monocytes Relative: 4 %
Neutro Abs: 9 10*3/uL — ABNORMAL HIGH (ref 1.4–6.5)
Neutrophils Relative %: 93 %
PLATELETS: 72 10*3/uL — AB (ref 150–440)
RBC: 3.7 MIL/uL — AB (ref 3.80–5.20)
RDW: 14 % (ref 11.5–14.5)
WBC: 9.7 10*3/uL (ref 3.6–11.0)

## 2017-07-13 LAB — URINALYSIS, COMPLETE (UACMP) WITH MICROSCOPIC
BILIRUBIN URINE: NEGATIVE
GLUCOSE, UA: NEGATIVE mg/dL
KETONES UR: NEGATIVE mg/dL
Nitrite: NEGATIVE
PH: 5 (ref 5.0–8.0)
Protein, ur: 30 mg/dL — AB
SQUAMOUS EPITHELIAL / LPF: NONE SEEN
Specific Gravity, Urine: 1.023 (ref 1.005–1.030)

## 2017-07-13 LAB — URINE DRUG SCREEN, QUALITATIVE (ARMC ONLY)
AMPHETAMINES, UR SCREEN: NOT DETECTED
Barbiturates, Ur Screen: NOT DETECTED
Benzodiazepine, Ur Scrn: NOT DETECTED
Cannabinoid 50 Ng, Ur ~~LOC~~: NOT DETECTED
Cocaine Metabolite,Ur ~~LOC~~: NOT DETECTED
MDMA (Ecstasy)Ur Screen: NOT DETECTED
Methadone Scn, Ur: NOT DETECTED
Opiate, Ur Screen: POSITIVE — AB
Phencyclidine (PCP) Ur S: NOT DETECTED
TRICYCLIC, UR SCREEN: NOT DETECTED

## 2017-07-13 LAB — GLUCOSE, CAPILLARY
Glucose-Capillary: 37 mg/dL — CL (ref 65–99)
Glucose-Capillary: 179 mg/dL — ABNORMAL HIGH (ref 65–99)
Glucose-Capillary: 112 mg/dL — ABNORMAL HIGH (ref 65–99)

## 2017-07-13 LAB — PROTIME-INR
INR: 1.62
INR: 1.74
Prothrombin Time: 19.1 seconds — ABNORMAL HIGH (ref 11.4–15.2)
Prothrombin Time: 20.2 seconds — ABNORMAL HIGH (ref 11.4–15.2)

## 2017-07-13 LAB — COMPREHENSIVE METABOLIC PANEL
ALK PHOS: 63 U/L (ref 38–126)
ALT: 31 U/L (ref 14–54)
ALT: 62 U/L — AB (ref 14–54)
AST: 64 U/L — ABNORMAL HIGH (ref 15–41)
AST: 114 U/L — ABNORMAL HIGH (ref 15–41)
Albumin: 3.2 g/dL — ABNORMAL LOW (ref 3.5–5.0)
Albumin: 2.9 g/dL — ABNORMAL LOW (ref 3.5–5.0)
Alkaline Phosphatase: 54 U/L (ref 38–126)
Anion gap: 8 (ref 5–15)
Anion gap: 8 (ref 5–15)
BUN: 34 mg/dL — ABNORMAL HIGH (ref 6–20)
BUN: 41 mg/dL — ABNORMAL HIGH (ref 6–20)
CALCIUM: 7.6 mg/dL — AB (ref 8.9–10.3)
CHLORIDE: 106 mmol/L (ref 101–111)
CO2: 22 mmol/L (ref 22–32)
CO2: 20 mmol/L — AB (ref 22–32)
CREATININE: 2.19 mg/dL — AB (ref 0.44–1.00)
Calcium: 8.3 mg/dL — ABNORMAL LOW (ref 8.9–10.3)
Chloride: 107 mmol/L (ref 101–111)
Creatinine, Ser: 2.32 mg/dL — ABNORMAL HIGH (ref 0.44–1.00)
GFR calc non Af Amer: 19 mL/min — ABNORMAL LOW (ref >60)
GFR calc non Af Amer: 17 mL/min — ABNORMAL LOW (ref >60)
GFR, EST AFRICAN AMERICAN: 22 mL/min — AB (ref >60)
GFR, EST AFRICAN AMERICAN: 20 mL/min — AB (ref >60)
GLUCOSE: 160 mg/dL — AB (ref 65–99)
Glucose, Bld: 90 mg/dL (ref 65–99)
Potassium: 3.9 mmol/L (ref 3.5–5.1)
Potassium: 5 mmol/L (ref 3.5–5.1)
SODIUM: 136 mmol/L (ref 135–145)
SODIUM: 135 mmol/L (ref 135–145)
Total Bilirubin: 1.7 mg/dL — ABNORMAL HIGH (ref 0.3–1.2)
Total Bilirubin: 3 mg/dL — ABNORMAL HIGH (ref 0.3–1.2)
Total Protein: 5.7 g/dL — ABNORMAL LOW (ref 6.5–8.1)
Total Protein: 5.4 g/dL — ABNORMAL LOW (ref 6.5–8.1)

## 2017-07-13 LAB — LACTIC ACID, PLASMA
LACTIC ACID, VENOUS: 3.1 mmol/L — AB (ref 0.5–1.9)
LACTIC ACID, VENOUS: 2.8 mmol/L — AB (ref 0.5–1.9)
Lactic Acid, Venous: 3.3 mmol/L (ref 0.5–1.9)

## 2017-07-13 LAB — PROCALCITONIN: PROCALCITONIN: 22.51 ng/mL

## 2017-07-13 LAB — FIBRINOGEN: Fibrinogen: 444 mg/dL (ref 210–475)

## 2017-07-13 LAB — CREATININE, SERUM
Creatinine, Ser: 2.38 mg/dL — ABNORMAL HIGH (ref 0.44–1.00)
GFR calc Af Amer: 20 mL/min — ABNORMAL LOW (ref >60)
GFR calc non Af Amer: 17 mL/min — ABNORMAL LOW (ref >60)

## 2017-07-13 LAB — BILIRUBIN, DIRECT: BILIRUBIN DIRECT: 0.6 mg/dL — AB (ref 0.1–0.5)

## 2017-07-13 LAB — TRIGLYCERIDES: Triglycerides: 107 mg/dL (ref <150)

## 2017-07-13 LAB — ACETAMINOPHEN LEVEL

## 2017-07-13 LAB — MRSA PCR SCREENING: MRSA by PCR: NEGATIVE

## 2017-07-13 LAB — ETHANOL

## 2017-07-13 LAB — MAGNESIUM: Magnesium: 1.6 mg/dL — ABNORMAL LOW (ref 1.7–2.4)

## 2017-07-13 LAB — FIBRIN DERIVATIVES D-DIMER (ARMC ONLY): Fibrin derivatives D-dimer (ARMC): 3099.04 ng/mL (FEU) — ABNORMAL HIGH (ref 0.00–499.00)

## 2017-07-13 LAB — PHOSPHORUS: PHOSPHORUS: 4.7 mg/dL — AB (ref 2.5–4.6)

## 2017-07-13 LAB — APTT: APTT: 39 s — AB (ref 24–36)

## 2017-07-13 SURGERY — CYSTOSCOPY, WITH RETROGRADE PYELOGRAM AND URETERAL STENT INSERTION
Anesthesia: General | Laterality: Left | Wound class: Clean Contaminated

## 2017-07-13 MED ORDER — PIPERACILLIN-TAZOBACTAM 3.375 G IVPB 30 MIN
3.3750 g | Freq: Once | INTRAVENOUS | Status: AC
  Administered 2017-07-13: 3.375 g via INTRAVENOUS
  Filled 2017-07-13: qty 50

## 2017-07-13 MED ORDER — ONDANSETRON HCL 4 MG/2ML IJ SOLN
INTRAMUSCULAR | Status: DC | PRN
  Administered 2017-07-13: 4 mg via INTRAVENOUS

## 2017-07-13 MED ORDER — SODIUM CHLORIDE 0.9 % IV BOLUS (SEPSIS)
1000.0000 mL | Freq: Once | INTRAVENOUS | Status: AC
  Administered 2017-07-13: 1000 mL via INTRAVENOUS

## 2017-07-13 MED ORDER — SUCCINYLCHOLINE CHLORIDE 20 MG/ML IJ SOLN
INTRAMUSCULAR | Status: AC
  Filled 2017-07-13: qty 1

## 2017-07-13 MED ORDER — PHENYLEPHRINE HCL 10 MG/ML IJ SOLN
INTRAMUSCULAR | Status: DC | PRN
  Administered 2017-07-13: 300 ug via INTRAVENOUS
  Administered 2017-07-13: 200 ug via INTRAVENOUS
  Administered 2017-07-13: 100 ug via INTRAVENOUS
  Administered 2017-07-13 (×2): 300 ug via INTRAVENOUS
  Administered 2017-07-13: 200 ug via INTRAVENOUS
  Administered 2017-07-13: 300 ug via INTRAVENOUS
  Administered 2017-07-13: 200 ug via INTRAVENOUS

## 2017-07-13 MED ORDER — LIDOCAINE HCL (PF) 2 % IJ SOLN
INTRAMUSCULAR | Status: AC
Start: 2017-07-13 — End: ?
  Filled 2017-07-13: qty 10

## 2017-07-13 MED ORDER — KETOROLAC TROMETHAMINE 15 MG/ML IJ SOLN
15.0000 mg | Freq: Four times a day (QID) | INTRAMUSCULAR | Status: DC | PRN
  Administered 2017-07-14 (×2): 15 mg via INTRAVENOUS
  Filled 2017-07-13 (×3): qty 1

## 2017-07-13 MED ORDER — SODIUM CHLORIDE 0.9% FLUSH
10.0000 mL | Freq: Two times a day (BID) | INTRAVENOUS | Status: DC
  Administered 2017-07-13 – 2017-07-17 (×9): 10 mL
  Administered 2017-07-18: 20 mL
  Administered 2017-07-18 – 2017-07-19 (×2): 10 mL
  Administered 2017-07-19: 20 mL
  Administered 2017-07-20: 10 mL

## 2017-07-13 MED ORDER — CHLORHEXIDINE GLUCONATE 0.12% ORAL RINSE (MEDLINE KIT)
15.0000 mL | Freq: Two times a day (BID) | OROMUCOSAL | Status: DC
  Administered 2017-07-13 – 2017-07-14 (×2): 15 mL via OROMUCOSAL

## 2017-07-13 MED ORDER — MIDAZOLAM HCL 2 MG/2ML IJ SOLN
INTRAMUSCULAR | Status: AC
  Filled 2017-07-13: qty 2

## 2017-07-13 MED ORDER — VASOPRESSIN 20 UNIT/ML IV SOLN
INTRAVENOUS | Status: AC
  Filled 2017-07-13: qty 1

## 2017-07-13 MED ORDER — PROPOFOL 1000 MG/100ML IV EMUL
INTRAVENOUS | Status: AC
  Administered 2017-07-13: 5 ug/kg/min via INTRAVENOUS
  Filled 2017-07-13: qty 100

## 2017-07-13 MED ORDER — HYDROCODONE-ACETAMINOPHEN 5-325 MG PO TABS: 1.0000 | ORAL_TABLET | ORAL | Status: DC | PRN

## 2017-07-13 MED ORDER — VANCOMYCIN HCL IN DEXTROSE 1-5 GM/200ML-% IV SOLN
1000.0000 mg | Freq: Once | INTRAVENOUS | Status: AC
  Administered 2017-07-13: 1000 mg via INTRAVENOUS
  Filled 2017-07-13: qty 200

## 2017-07-13 MED ORDER — DEXTROSE 50 % IV SOLN
INTRAVENOUS | Status: AC
  Administered 2017-07-13: 50 mL
  Filled 2017-07-13: qty 50

## 2017-07-13 MED ORDER — VASOPRESSIN 20 UNIT/ML IV SOLN
INTRAVENOUS | Status: DC | PRN
  Administered 2017-07-13 (×4): 2 [IU] via INTRAVENOUS

## 2017-07-13 MED ORDER — PROPOFOL 10 MG/ML IV BOLUS
INTRAVENOUS | Status: AC
Start: 2017-07-13 — End: ?
  Filled 2017-07-13: qty 20

## 2017-07-13 MED ORDER — PROPOFOL 1000 MG/100ML IV EMUL
5.0000 ug/kg/min | INTRAVENOUS | Status: DC
  Administered 2017-07-13: 5 ug/kg/min via INTRAVENOUS
  Administered 2017-07-14: 25 ug/kg/min via INTRAVENOUS
  Filled 2017-07-13: qty 100

## 2017-07-13 MED ORDER — ONDANSETRON HCL 4 MG PO TABS: 4.0000 mg | ORAL_TABLET | Freq: Four times a day (QID) | ORAL | Status: DC | PRN

## 2017-07-13 MED ORDER — PIPERACILLIN-TAZOBACTAM 3.375 G IVPB
3.3750 g | Freq: Two times a day (BID) | INTRAVENOUS | Status: DC
  Administered 2017-07-13: 3.375 g via INTRAVENOUS
  Filled 2017-07-13: qty 50

## 2017-07-13 MED ORDER — ONDANSETRON HCL 4 MG/2ML IJ SOLN: 4.0000 mg | Freq: Four times a day (QID) | INTRAMUSCULAR | Status: DC | PRN

## 2017-07-13 MED ORDER — INFLUENZA VAC SPLIT HIGH-DOSE 0.5 ML IM SUSY
0.5000 mL | PREFILLED_SYRINGE | INTRAMUSCULAR | Status: DC
  Filled 2017-07-13: qty 0.5

## 2017-07-13 MED ORDER — FENTANYL CITRATE (PF) 100 MCG/2ML IJ SOLN
INTRAMUSCULAR | Status: AC
  Filled 2017-07-13: qty 2

## 2017-07-13 MED ORDER — ONDANSETRON HCL 4 MG/2ML IJ SOLN
INTRAMUSCULAR | Status: AC
  Filled 2017-07-13: qty 2

## 2017-07-13 MED ORDER — PNEUMOCOCCAL VAC POLYVALENT 25 MCG/0.5ML IJ INJ: 0.5000 mL | INJECTION | INTRAMUSCULAR | Status: DC

## 2017-07-13 MED ORDER — NOREPINEPHRINE BITARTRATE 1 MG/ML IV SOLN
0.0000 ug/min | INTRAVENOUS | Status: DC
  Administered 2017-07-13: 5 ug/min via INTRAVENOUS
  Administered 2017-07-14: 6 ug/min via INTRAVENOUS
  Administered 2017-07-14: 8 ug/min via INTRAVENOUS
  Filled 2017-07-13 (×2): qty 4

## 2017-07-13 MED ORDER — MECLIZINE HCL 25 MG PO TABS
25.0000 mg | ORAL_TABLET | Freq: Three times a day (TID) | ORAL | Status: DC | PRN
  Filled 2017-07-13: qty 1

## 2017-07-13 MED ORDER — SODIUM CHLORIDE 0.9 % IV SOLN
INTRAVENOUS | Status: DC
  Administered 2017-07-13 (×2): via INTRAVENOUS
  Administered 2017-07-14: 75 mL/h via INTRAVENOUS
  Administered 2017-07-14 (×2): via INTRAVENOUS

## 2017-07-13 MED ORDER — SODIUM CHLORIDE 0.9 % IV BOLUS (SEPSIS)
500.0000 mL | INTRAVENOUS | Status: AC
  Administered 2017-07-13 – 2017-07-14 (×2): 500 mL via INTRAVENOUS

## 2017-07-13 MED ORDER — EPINEPHRINE PF 1 MG/ML IJ SOLN
INTRAMUSCULAR | Status: DC | PRN
  Administered 2017-07-13 (×2): .02 mg via INTRAVENOUS
  Administered 2017-07-13: .1 mg via INTRAVENOUS

## 2017-07-13 MED ORDER — SODIUM CHLORIDE 0.9 % IV BOLUS (SEPSIS)
500.0000 mL | Freq: Once | INTRAVENOUS | Status: AC
  Administered 2017-07-13: 500 mL via INTRAVENOUS

## 2017-07-13 MED ORDER — ALBUTEROL SULFATE (2.5 MG/3ML) 0.083% IN NEBU
2.5000 mg | INHALATION_SOLUTION | RESPIRATORY_TRACT | Status: DC | PRN
  Administered 2017-07-17: 2.5 mg via RESPIRATORY_TRACT
  Filled 2017-07-13: qty 3

## 2017-07-13 MED ORDER — SODIUM CHLORIDE 0.9 % IV SOLN
INTRAVENOUS | Status: DC
  Administered 2017-07-13: 15:00:00 via INTRAVENOUS

## 2017-07-13 MED ORDER — ACETAMINOPHEN 650 MG RE SUPP
650.0000 mg | Freq: Four times a day (QID) | RECTAL | Status: DC | PRN
  Administered 2017-07-20 (×2): 650 mg via RECTAL
  Filled 2017-07-13 (×2): qty 1

## 2017-07-13 MED ORDER — ACETAMINOPHEN 325 MG PO TABS: 650.0000 mg | ORAL_TABLET | Freq: Four times a day (QID) | ORAL | Status: DC | PRN

## 2017-07-13 MED ORDER — ROCURONIUM BROMIDE 50 MG/5ML IV SOLN
INTRAVENOUS | Status: AC
  Filled 2017-07-13: qty 1

## 2017-07-13 MED ORDER — SUCCINYLCHOLINE CHLORIDE 20 MG/ML IJ SOLN
INTRAMUSCULAR | Status: DC | PRN
  Administered 2017-07-13: 80 mg via INTRAVENOUS

## 2017-07-13 MED ORDER — ORAL CARE MOUTH RINSE
15.0000 mL | OROMUCOSAL | Status: DC
  Administered 2017-07-13 – 2017-07-14 (×10): 15 mL via OROMUCOSAL

## 2017-07-13 MED ORDER — SENNOSIDES-DOCUSATE SODIUM 8.6-50 MG PO TABS: 1.0000 | ORAL_TABLET | Freq: Every evening | ORAL | Status: DC | PRN

## 2017-07-13 MED ORDER — FENTANYL CITRATE (PF) 100 MCG/2ML IJ SOLN
INTRAMUSCULAR | Status: DC | PRN
  Administered 2017-07-13: 25 ug via INTRAVENOUS

## 2017-07-13 MED ORDER — LIDOCAINE HCL (CARDIAC) 20 MG/ML IV SOLN
INTRAVENOUS | Status: DC | PRN
  Administered 2017-07-13: 70 mg via INTRAVENOUS

## 2017-07-13 MED ORDER — ROCURONIUM BROMIDE 100 MG/10ML IV SOLN
INTRAVENOUS | Status: DC | PRN
  Administered 2017-07-13: 10 mg via INTRAVENOUS

## 2017-07-13 MED ORDER — TAMSULOSIN HCL 0.4 MG PO CAPS: 0.4000 mg | ORAL_CAPSULE | Freq: Every day | ORAL | Status: DC

## 2017-07-13 MED ORDER — ORAL CARE MOUTH RINSE: 15.0000 mL | Freq: Four times a day (QID) | OROMUCOSAL | Status: DC

## 2017-07-13 MED ORDER — NALOXONE HCL 2 MG/2ML IJ SOSY
0.4000 mg | PREFILLED_SYRINGE | Freq: Once | INTRAMUSCULAR | Status: AC
Start: 2017-07-13 — End: 2017-07-13
  Administered 2017-07-13: 1 mg via INTRAVENOUS
  Filled 2017-07-13: qty 2

## 2017-07-13 MED ORDER — SODIUM CHLORIDE 0.9% FLUSH: 10.0000 mL | INTRAVENOUS | Status: DC | PRN

## 2017-07-13 MED ORDER — MIDAZOLAM HCL 2 MG/2ML IJ SOLN
INTRAMUSCULAR | Status: DC | PRN
  Administered 2017-07-13: 1 mg via INTRAVENOUS

## 2017-07-13 MED ORDER — ETOMIDATE 2 MG/ML IV SOLN
INTRAVENOUS | Status: DC | PRN
  Administered 2017-07-13: 10 mg via INTRAVENOUS

## 2017-07-13 MED ORDER — PROPOFOL 10 MG/ML IV BOLUS
INTRAVENOUS | Status: DC | PRN
  Administered 2017-07-13 (×2): 10 mg via INTRAVENOUS

## 2017-07-13 MED ORDER — DEXAMETHASONE SODIUM PHOSPHATE 10 MG/ML IJ SOLN
INTRAMUSCULAR | Status: AC
  Filled 2017-07-13: qty 1

## 2017-07-13 MED ORDER — DOCUSATE SODIUM 100 MG PO CAPS
100.0000 mg | ORAL_CAPSULE | Freq: Two times a day (BID) | ORAL | Status: DC
  Administered 2017-07-17: 100 mg via ORAL
  Filled 2017-07-13 (×2): qty 1

## 2017-07-13 MED ORDER — DEXAMETHASONE SODIUM PHOSPHATE 10 MG/ML IJ SOLN
INTRAMUSCULAR | Status: DC | PRN
  Administered 2017-07-13: 5 mg via INTRAVENOUS

## 2017-07-13 MED ORDER — LACTATED RINGERS IV SOLN
INTRAVENOUS | Status: DC | PRN
  Administered 2017-07-13: 16:00:00 via INTRAVENOUS

## 2017-07-13 MED ORDER — SODIUM CHLORIDE 0.9 % IV BOLUS (SEPSIS): 1000.0000 mL | Freq: Once | INTRAVENOUS | Status: DC

## 2017-07-13 SURGICAL SUPPLY — 24 items
BAG URO CATCHER STRL LF (MISCELLANEOUS) ×3 IMPLANT
BASKET LASER NITINOL 1.9FR (BASKET) IMPLANT
BASKET STONE 1.7 NGAGE (UROLOGICAL SUPPLIES) IMPLANT
BASKET ZERO TIP NITINOL 2.4FR (BASKET) IMPLANT
CANISTER SUCT LVC 12 LTR MEDI- (MISCELLANEOUS) IMPLANT
CATH INTERMIT  6FR 70CM (CATHETERS) IMPLANT
CLOTH BEACON ORANGE TIMEOUT ST (SAFETY) ×3 IMPLANT
FIBER LASER FLEXIVA 365 (UROLOGICAL SUPPLIES) IMPLANT
FIBER LASER TRAC TIP (UROLOGICAL SUPPLIES) IMPLANT
GLOVE BIO SURGEON STRL SZ8 (GLOVE) ×3 IMPLANT
GOWN STRL REUS W/ TWL LRG LVL3 (GOWN DISPOSABLE) ×1 IMPLANT
GOWN STRL REUS W/ TWL XL LVL3 (GOWN DISPOSABLE) ×1 IMPLANT
GOWN STRL REUS W/TWL LRG LVL3 (GOWN DISPOSABLE) ×2
GOWN STRL REUS W/TWL XL LVL3 (GOWN DISPOSABLE) ×2
GUIDEWIRE ANG ZIPWIRE 038X150 (WIRE) ×3 IMPLANT
GUIDEWIRE STR DUAL SENSOR (WIRE) ×3 IMPLANT
MANIFOLD NEPTUNE II (INSTRUMENTS) IMPLANT
PACK CYSTO (CUSTOM PROCEDURE TRAY) ×3 IMPLANT
SOL .9 NS 3000ML IRR  AL (IV SOLUTION) ×2
SOL .9 NS 3000ML IRR UROMATIC (IV SOLUTION) ×1 IMPLANT
STENT URET 6FRX26 CONTOUR (STENTS) ×3 IMPLANT
STENT URETL 6X26 FLEX (Stent) ×3 IMPLANT
SYR 10ML LL (SYRINGE) ×3 IMPLANT
TUBE FEEDING 8FR 16IN STR KANG (MISCELLANEOUS) IMPLANT

## 2017-07-13 NOTE — Consult Note (Signed)
Urology Consult  Referring physician: Dr. Bridgett Larsson Reason for referral: left ureteral stone, sepsis  Chief Complaint: left abdominal pain  History of Present Illness: Yvette Thomas is a 84yo with a hx of HTN, HLD, GERD who presented to the ER today after being found unresponsive by her daughter. She had a know 72m left ureteral calculus and was under medical expulsive therapy. UA today appears infected. She is hypotensive and tachycardic. No fevers. The patient is confused and nonverbal which is not her usual state. WBC count normal creatinine has increased to 2.4 from a baseline of 1.0 She underwent CXR in the ER which showed a possible left lower lobe pneumonia. Per her daughter the pain start 5 days ago and became progressively worse. No hematuria or LUTS.  No hx of nephrolithiasis  Past Medical History:  Diagnosis Date  . Allergic rhinitis, cause unspecified   . Anxiety   . Colon polyps   . Fibrocystic breast   . GERD (gastroesophageal reflux disease)   . Hyperlipidemia   . Hypertension   . Hypothyroidism   . Osteoarthritis of multiple joints   . Osteoporosis   . Pyelitis   . Syncope and collapse    Hx  . Urinary incontinence   . Vertigo    Past Surgical History:  Procedure Laterality Date  . CATARACT EXTRACTION W/PHACO Right 05/28/2016   Procedure: CATARACT EXTRACTION PHACO AND INTRAOCULAR LENS PLACEMENT (IOC);  Surgeon: ARonnell Freshwater MD;  [facility] [facility]  Service: Ophthalmology;  Laterality: Right;  RIGHT  . CATARACT EXTRACTION W/PHACO Left 07/30/2016   Procedure: CATARACT EXTRACTION PHACO AND INTRAOCULAR LENS PLACEMENT (IOC);  Surgeon: ARonnell Freshwater MD;  Location: [facility]  Service: Ophthalmology;  Laterality: Left;  Left  . THYROIDECTOMY, PARTIAL  1996    Medications: I have reviewed the patient's current medications. Allergies:  Allergies  Allergen Reactions  . Buspirone Hcl     REACTION: Tinnitis, dizzy  . Oxybutynin Chloride      REACTION: Dried up  . Tolterodine Tartrate     REACTION: Headache    Family History  Problem Relation Age of Onset  . Hypertension Sister   . Melanoma Sister   . Heart disease Mother   . Heart disease Father   . Hypertension Brother    Social History:  reports that she quit smoking about 49 years ago. She quit after 25.00 years of use. she has never used smokeless tobacco. She reports that she does not drink alcohol or use drugs.  Review of Systems  Unable to perform ROS: Mental status change    Physical Exam:  Vital signs in last 24 hours: Temp:  [97.8 F (36.6 C)-98.4 F (36.9 C)] 97.8 F (36.6 C) (01/12 1208) Pulse Rate:  [85-97] 89 (01/12 1208) Resp:  [20-25] 20 (01/12 1208) BP: (63-106)/(36-73) 90/41 (01/12 1208) SpO2:  [85 %-97 %] 96 % (01/12 1208) Weight:  [64.9 kg (143 lb)-66.7 kg (147 lb)] 66.7 kg (147 lb) (01/12 1208) Physical Exam  Constitutional: She appears well-developed and well-nourished.  HENT:  Head: Normocephalic and atraumatic.  Eyes: EOM are normal. Pupils are equal, round, and reactive to light.  Neck: Normal range of motion. No thyromegaly present.  Cardiovascular: Normal rate and regular rhythm.  Respiratory: Effort normal. No respiratory distress.  GI: Soft. She exhibits no distension and no mass. There is no tenderness. There is no rebound and no guarding.  Musculoskeletal: Normal range of motion. She exhibits no edema.  Neurological: She is alert.  Coordination abnormal.  Skin: Skin is warm and dry.  Psychiatric: Her mood appears anxious. Her affect is labile. Her speech is tangential and slurred. She is agitated. Cognition and memory are impaired. She expresses inappropriate judgment. She is inattentive.    Laboratory Data:  Results for orders placed or performed during the hospital encounter of 07/19/2017 (from the past 72 hour(s))  CBC with Differential     Status: Abnormal   Collection Time: 07/22/2017  8:35 AM  Result Value Ref Range    WBC 9.7 3.6 - 11.0 K/uL   RBC 3.70 (L) 3.80 - 5.20 MIL/uL   Hemoglobin 11.2 (L) 12.0 - 16.0 g/dL   HCT 34.2 (L) 35.0 - 47.0 %   MCV 92.5 80.0 - 100.0 fL   MCH 30.4 26.0 - 34.0 pg   MCHC 32.8 32.0 - 36.0 g/dL   RDW 14.0 11.5 - 14.5 %   Platelets 72 (L) 150 - 440 K/uL   Neutrophils Relative % 93 %   Neutro Abs 9.0 (H) 1.4 - 6.5 K/uL   Lymphocytes Relative 2 %   Lymphs Abs 0.2 (L) 1.0 - 3.6 K/uL   Monocytes Relative 4 %   Monocytes Absolute 0.4 0.2 - 0.9 K/uL   Eosinophils Relative 1 %   Eosinophils Absolute 0.1 0 - 0.7 K/uL   Basophils Relative 0 %   Basophils Absolute 0.0 0 - 0.1 K/uL    Comment: Performed at [facility], [address] [place], [place] [address]  Comprehensive metabolic panel     Status: Abnormal   Collection Time: 07/17/2017  8:35 AM  Result Value Ref Range   Sodium 136 135 - 145 mmol/L   Potassium 3.9 3.5 - 5.1 mmol/L   Chloride 106 101 - 111 mmol/L   CO2 22 22 - 32 mmol/L   Glucose, Bld 90 65 - 99 mg/dL   BUN 34 (H) 6 - 20 mg/dL   Creatinine, Ser 2.19 (H) 0.44 - 1.00 mg/dL    Comment: RESULTS VERIFIED BY REPEAT TESTING SNJ   Calcium 8.3 (L) 8.9 - 10.3 mg/dL   Total Protein 5.7 (L) 6.5 - 8.1 g/dL   Albumin 3.2 (L) 3.5 - 5.0 g/dL   AST 64 (H) 15 - 41 U/L   ALT 31 14 - 54 U/L   Alkaline Phosphatase 54 38 - 126 U/L   Total Bilirubin 1.7 (H) 0.3 - 1.2 mg/dL   GFR calc non Af Amer 19 (L) >60 mL/min   GFR calc Af Amer 22 (L) >60 mL/min    Comment: (NOTE) The eGFR has been calculated using the CKD EPI equation. This calculation has not been validated in all clinical situations. eGFR's persistently <60 mL/min signify possible Chronic Kidney Disease.    Anion gap 8 5 - 15    Comment: Performed at [facility], [place], [place] [address]  Lactic acid, plasma     Status: Abnormal   Collection Time: 07/16/2017  8:35 AM  Result Value Ref Range   Lactic Acid, Venous 3.3 (HH) 0.5 - 1.9 mmol/L    Comment: CRITICAL RESULT CALLED  TO, READ BACK BY AND VERIFIED WITH JAY BROOKS AT [phone] ON 07/02/2017 BY SNJ Performed at [facility], [address] [place],  [address]   Ethanol     Status: None   Collection Time: 07/14/2017 10:14 AM  Result Value Ref Range   Alcohol, Ethyl (B) <10 <10 mg/dL    Comment:  LOWEST DETECTABLE LIMIT FOR SERUM ALCOHOL IS 10 mg/dL FOR MEDICAL PURPOSES ONLY Performed at [facility], [address] [place], [place] [address]   Acetaminophen level     Status: Abnormal   Collection Time: 07/23/2017 10:14 AM  Result Value Ref Range   Acetaminophen (Tylenol), Serum <10 (L) 10 - 30 ug/mL    Comment:        THERAPEUTIC CONCENTRATIONS VARY SIGNIFICANTLY. A RANGE OF 10-30 ug/mL MAY BE AN EFFECTIVE CONCENTRATION FOR MANY PATIENTS. HOWEVER, SOME ARE BEST TREATED AT CONCENTRATIONS OUTSIDE THIS RANGE. ACETAMINOPHEN CONCENTRATIONS >150 ug/mL AT 4 HOURS AFTER INGESTION AND >50 ug/mL AT 12 HOURS AFTER INGESTION ARE OFTEN ASSOCIATED WITH TOXIC REACTIONS. Performed at [facility], [address] [place], [place] [address]   Lactic acid, plasma     Status: Abnormal   Collection Time: 07/09/2017 10:31 AM  Result Value Ref Range   Lactic Acid, Venous 3.1 (HH) 0.5 - 1.9 mmol/L    Comment: CRITICAL RESULT CALLED TO, READ BACK BY AND VERIFIED WITH CALLED JAY BROOKS AT 1135 ON 07/03/2017 BY SNJ Performed at [facility], [address] [place], [place] [address]   Urinalysis, Complete w Microscopic     Status: Abnormal   Collection Time: 07/15/2017 10:31 AM  Result Value Ref Range   Color, Urine AMBER (A) YELLOW    Comment: BIOCHEMICALS MAY BE AFFECTED BY COLOR   APPearance TURBID (A) CLEAR   Specific Gravity, Urine 1.023 1.005 - 1.030   pH 5.0 5.0 - 8.0   Glucose, UA NEGATIVE NEGATIVE mg/dL   Hgb urine dipstick LARGE (A) NEGATIVE   Bilirubin Urine NEGATIVE NEGATIVE   Ketones, ur NEGATIVE NEGATIVE mg/dL   Protein, ur 30 (A) NEGATIVE mg/dL    Nitrite NEGATIVE NEGATIVE   Leukocytes, UA MODERATE (A) NEGATIVE   RBC / HPF TOO NUMEROUS TO COUNT 0 - 5 RBC/hpf   WBC, UA TOO NUMEROUS TO COUNT 0 - 5 WBC/hpf   Bacteria, UA MANY (A) NONE SEEN   Squamous Epithelial / LPF NONE SEEN NONE SEEN   Amorphous Crystal PRESENT     Comment: Performed at [facility], [address] [place], [place] [address]  Urine Drug Screen, Qualitative ([facility] only)     Status: Abnormal   Collection Time: 07/06/2017 10:31 AM  Result Value Ref Range   Tricyclic, Ur Screen NONE DETECTED NONE DETECTED   Amphetamines, Ur Screen NONE DETECTED NONE DETECTED   MDMA (Ecstasy)Ur Screen NONE DETECTED NONE DETECTED   Cocaine Metabolite,Ur [place] NONE DETECTED NONE DETECTED   Opiate, Ur Screen POSITIVE (A) NONE DETECTED   Phencyclidine (PCP) Ur S NONE DETECTED NONE DETECTED   Cannabinoid 50 Ng, Ur [place] NONE DETECTED NONE DETECTED   Barbiturates, Ur Screen NONE DETECTED NONE DETECTED   Benzodiazepine, Ur Scrn NONE DETECTED NONE DETECTED   Methadone Scn, Ur NONE DETECTED NONE DETECTED    Comment: (NOTE) Tricyclics + metabolites, urine    Cutoff 1000 ng/mL Amphetamines + metabolites, urine  Cutoff 1000 ng/mL MDMA (Ecstasy), urine              Cutoff 500 ng/mL Cocaine Metabolite, urine          Cutoff 300 ng/mL Opiate + metabolites, urine        Cutoff 300 ng/mL Phencyclidine (PCP), urine         Cutoff 25 ng/mL Cannabinoid, urine                 Cutoff 50  ng/mL Barbiturates + metabolites, urine  Cutoff 200 ng/mL Benzodiazepine, urine              Cutoff 200 ng/mL Methadone, urine                   Cutoff 300 ng/mL The urine drug screen provides only a preliminary, unconfirmed analytical test result and should not be used for non-medical purposes. Clinical consideration and professional judgment should be applied to any positive drug screen result due to possible interfering substances. A more specific alternate chemical method must be used in order to obtain a  confirmed analytical result. Gas chromatography / mass spectrometry (GC/Yvette) is the preferred confirmat ory method. Performed at [facility], [address] [place], [place] [address]   Creatinine, serum     Status: Abnormal   Collection Time: 07/11/2017 12:47 PM  Result Value Ref Range   Creatinine, Ser 2.38 (H) 0.44 - 1.00 mg/dL   GFR calc non Af Amer 17 (L) >60 mL/min   GFR calc Af Amer 20 (L) >60 mL/min    Comment: (NOTE) The eGFR has been calculated using the CKD EPI equation. This calculation has not been validated in all clinical situations. eGFR's persistently <60 mL/min signify possible Chronic Kidney Disease. Performed at [facility], [address] [place], [place] [address]   Protime-INR     Status: Abnormal   Collection Time: 07/12/2017 12:47 PM  Result Value Ref Range   Prothrombin Time 19.1 (H) 11.4 - 15.2 seconds   INR 1.62     Comment: Performed at [facility], [address] [place], [place] [address]  APTT     Status: Abnormal   Collection Time: 07/27/2017 12:47 PM  Result Value Ref Range   aPTT 39 (H) 24 - 36 seconds    Comment:        IF BASELINE aPTT IS ELEVATED, SUGGEST PATIENT RISK ASSESSMENT BE USED TO DETERMINE APPROPRIATE ANTICOAGULANT THERAPY. Performed at [facility], [address] [place], [place] [address]   Procalcitonin     Status: None   Collection Time: 07/07/2017 12:47 PM  Result Value Ref Range   Procalcitonin 22.51 ng/mL    Comment:        Interpretation: PCT >= 10 ng/mL: Important systemic inflammatory response, almost exclusively due to severe bacterial sepsis or septic shock. (NOTE)       Sepsis PCT Algorithm           Lower Respiratory Tract                                      Infection PCT Algorithm    ----------------------------     ----------------------------         PCT < 0.25 ng/mL                PCT < 0.10 ng/mL         Strongly encourage             Strongly discourage    discontinuation of antibiotics    initiation of antibiotics    ----------------------------     -----------------------------       PCT 0.25 - 0.50 ng/mL            PCT 0.10 - 0.25 ng/mL               OR       >  80% decrease in PCT            Discourage initiation of                                            antibiotics      Encourage discontinuation           of antibiotics    ----------------------------     -----------------------------         PCT >= 0.50 ng/mL              PCT 0.26 - 0.50 ng/mL                AND       <80% decrease in PCT             Encourage initiation of                                             antibiotics       Encourage continuation           of antibiotics    ----------------------------     -----------------------------        PCT >= 0.50 ng/mL                  PCT > 0.50 ng/mL               AND         increase in PCT                  Strongly encourage                                      initiation of antibiotics    Strongly encourage escalation           of antibiotics                                     -----------------------------                                           PCT <= 0.25 ng/mL                                                 OR                                        > 80% decrease in PCT                                     Discontinue / Do not initiate  antibiotics Performed at [facility], [address] [place], [place] [address]    Recent Results (from the past 240 hour(s))  Urine Culture     Status: Abnormal (Preliminary result)   Collection Time: 07/12/17 10:29 AM  Result Value Ref Range Status   Specimen Description   Final    URINE, CATHETERIZED Performed at [facility], [address] [place], [place] [address]    Special Requests   Final    NONE Performed at [facility], [address] [place], [place] [address]    Culture >=100,000  COLONIES/mL ESCHERICHIA COLI (A)  Final   Report Status PENDING  Incomplete   Creatinine: Recent Labs    07/12/17 1030 07/22/2017 0835 07/07/2017 1247  CREATININE 0.99 2.19* 2.38*   Baseline Creatinine: 1  Impression/Assessment:  84yo with left ureteral calculus and sepsis from a urinary source  Plan:  1. Left ureteral calculus with sepsis: Please start broad spectrum antibiotics. The risks/benefits/alternatives to left ureteral stent placement was explained to the daughter and she understands and wishes to proceed with surgery. She will be taken urgently to the OR for left ureteral stent placement.   Nicolette Bang 07/06/2017, 2:34 PM

## 2017-07-13 NOTE — Progress Notes (Signed)
Pharmacy Antibiotic Note  Yvette Thomas is a 84 y.o. female admitted on 01-Mar-2018 with sepsis, ?pneumonia, UTI  Pharmacy has been consulted for Vancomycin and Zosyn dosing.  Plan: Patient received Zosyn 3.375 gm IV x 1 in ER and Vancomycin 1 gram IV x 1 in ER. -continue Zosyn EI 3.375gm IV q12h for Crcl < 20 ml/min.  -Vancomycin: With Crcl 13 ml/min in 84 yo, will dose Vancomycin per levels with unstable renal fxn (Scr in ER on 07/12/17 = 0.99, now 2.38). Wt 66.7 kg  Ht 624ft 11 in    Adj BW= 53.1 kg. Will order Random Vancomycin level on 07/14/17 at 1000 (24 hrs after 1st dose).     Height: 4\' 11"  (149.9 cm) Weight: 147 lb (66.7 kg) IBW/kg (Calculated) : 43.2  Temp (24hrs), Avg:98.1 F (36.7 C), Min:97.8 F (36.6 C), Max:98.4 F (36.9 C)  Recent Labs  Lab 07/12/17 1030 2018-05-25 0835 2018-05-25 1031 2018-05-25 1247  WBC 11.9* 9.7  --   --   CREATININE 0.99 2.19*  --  2.38*  LATICACIDVEN  --  3.3* 3.1*  --     Estimated Creatinine Clearance: 13 mL/min (A) (by C-G formula based on SCr of 2.38 mg/dL (H)).    Allergies  Allergen Reactions  . Buspirone Hcl     REACTION: Tinnitis, dizzy  . Oxybutynin Chloride     REACTION: Dried up  . Tolterodine Tartrate     REACTION: Headache    Antimicrobials this admission: Vanc 1/12 >>   Zosyn 1/12 >>    Dose adjustments this admission:    Microbiology results: 1/12 BCx: pending 1/12 UCx: pending    Sputum:      MRSA PCR:  pending  Thank you for allowing pharmacy to be a part of this patient's care.  Phi Avans A 01-Mar-2018 2:19 PM

## 2017-07-13 NOTE — ED Notes (Signed)
Date and time results received: 07/19/2017 1136 (use smartphrase ".now" to insert current time)  Test: Lactic Acid Critical Value: 3.1  Name of Provider Notified: Dr. Wynelle BeckmannPadowski  Orders Received? Or Actions Taken?: Actions Taken: Notified provider

## 2017-07-13 NOTE — ED Triage Notes (Signed)
Patient given hydrocodone for kidney stone yesterday, patient never had this medication before, family fouind patient unresponsive. Patient's pupils with EMS sluggish. Currently pupils pinpoint.

## 2017-07-13 NOTE — Anesthesia Preprocedure Evaluation (Signed)
Anesthesia Evaluation  Patient identified by MRN, date of birth, ID band Patient awake    Reviewed: Allergy & Precautions, H&P , NPO status , Patient's Chart, lab work & pertinent test results, reviewed documented beta blocker date and time   History of Anesthesia Complications Negative for: history of anesthetic complications  Airway Mallampati: II  TM Distance: >3 FB Neck ROM: full    Dental  (+) Teeth Intact   Pulmonary neg pulmonary ROS, former smoker,           Cardiovascular Exercise Tolerance: Good hypertension, (-) angina(-) CAD, (-) Past MI, (-) Cardiac Stents and (-) CABG (-) dysrhythmias (-) Valvular Problems/Murmurs     Neuro/Psych PSYCHIATRIC DISORDERS negative neurological ROS     GI/Hepatic Neg liver ROS, GERD  ,  Endo/Other  neg diabetesHypothyroidism   Renal/GU ARFRenal disease (kidney stone)  negative genitourinary   Musculoskeletal   Abdominal   Peds  Hematology negative hematology ROS (+)   Anesthesia Other Findings Past Medical History: No date: Allergic rhinitis, cause unspecified No date: Anxiety No date: Colon polyps No date: Fibrocystic breast No date: GERD (gastroesophageal reflux disease) No date: Hyperlipidemia No date: Hypertension No date: Hypothyroidism No date: Osteoarthritis of multiple joints No date: Osteoporosis No date: Pyelitis No date: Syncope and collapse     Comment:  Hx No date: Urinary incontinence No date: Vertigo  Patient with altered mental status emergently coming to the OR for a cysto stent due to an obstructing stone.  Plan will be to leave her intubated at the end of the case and go to the ICU.  Reproductive/Obstetrics negative OB ROS                             Anesthesia Physical Anesthesia Plan  ASA: III and emergent  Anesthesia Plan: General   Post-op Pain Management:    Induction: Intravenous, Rapid sequence and  Cricoid pressure planned  PONV Risk Score and Plan: 3 and Ondansetron and Dexamethasone  Airway Management Planned: Oral ETT  Additional Equipment:   Intra-op Plan:   Post-operative Plan: Post-operative intubation/ventilation  Informed Consent: I have reviewed the patients History and Physical, chart, labs and discussed the procedure including the risks, benefits and alternatives for the proposed anesthesia with the patient or authorized representative who has indicated his/her understanding and acceptance.   Dental Advisory Given  Plan Discussed with: Anesthesiologist, CRNA and Surgeon  Anesthesia Plan Comments:         Anesthesia Quick Evaluation

## 2017-07-13 NOTE — Op Note (Signed)
.  Preoperative diagnosis: Left ureteral stone, sepsis  Postoperative diagnosis: Same  Procedure: 1 cystoscopy 2. Left retrograde pyelography 3.  Intraoperative fluoroscopy, under one hour, with interpretation 4. Left 6 x 26 JJ stent placement  Attending: Wilkie AyePatrick Hildagarde Holleran  Anesthesia: General  Estimated blood loss: None  Drains: Left 6 x 26 JJ ureteral stent without tether, 16 French foley catheter  Specimens: none  Antibiotics: Zosyn  Findings: left mid ureteral stone. Moderate hydronephrosis. No masses/lesions in the bladder. Ureteral orifices in normal anatomic location.  Indications: Patient is a 84 year old female with a history of left ureteral stone and concern for sepsis.  After discussing treatment options, they decided proceed with left stent placement.  Procedure her in detail: The patient was brought to the operating room and a brief timeout was done to ensure correct patient, correct procedure, correct site.  General anesthesia was administered patient was placed in dorsal lithotomy position.  Their genitalia was then prepped and draped in usual sterile fashion.  A rigid 22 French cystoscope was passed in the urethra and the bladder.  Bladder was inspected free masses or lesions.  the ureteral orifices were in the normal orthotopic locations.  a 6 french ureteral catheter was then instilled into the left ureteral orifice.  a gentle retrograde was obtained and findings noted above.  we then placed a zip wire through the ureteral catheter and advanced up to the renal pelvis.    We then placed a 6 x 26 double-j ureteral stent over the original zip wire.  We then removed the wire and good coil was noted in the the renal pelvis under fluoroscopy and the bladder under direct vision.  A foley catheter was then placed. the bladder was then drained and this concluded the procedure which was well tolerated by patient.  Complications: None  Condition: Stable, intubated, transferred to  ICU  Plan: Patient is to be admitted for IV antibiotics. He will have his stone extraction in 2 weeks.

## 2017-07-13 NOTE — ED Notes (Signed)
Patient becoming restless and figity.

## 2017-07-13 NOTE — Anesthesia Procedure Notes (Signed)
Procedure Name: Intubation Date/Time: 07/18/2017 4:31 PM Performed by: Doreen Salvage, CRNA Pre-anesthesia Checklist: Patient identified, Patient being monitored, Timeout performed, Emergency Drugs available and Suction available Patient Re-evaluated:Patient Re-evaluated prior to induction Oxygen Delivery Method: Circle system utilized Preoxygenation: Pre-oxygenation with 100% oxygen Induction Type: IV induction Ventilation: Mask ventilation without difficulty Laryngoscope Size: Mac and 3 Grade View: Grade I Tube type: Oral Tube size: 7.5 mm Number of attempts: 1 Airway Equipment and Method: Stylet Placement Confirmation: ETT inserted through vocal cords under direct vision,  positive ETCO2 and breath sounds checked- equal and bilateral Secured at: 21 cm Tube secured with: Tape Dental Injury: Teeth and Oropharynx as per pre-operative assessment

## 2017-07-13 NOTE — ED Notes (Signed)
Called Kinnie FeilMegan Oakley RN and gave report

## 2017-07-13 NOTE — H&P (Addendum)
[facility] [place] [facility]   PATIENT NAME: Yvette Thomas    MR#:  [id number]  DATE OF BIRTH:  10-18-1924  DATE OF ADMISSION:  07/19/2017  PRIMARY CARE PHYSICIAN: Karie SchwalbeLetvak, Richard I, MD   REQUESTING/REFERRING PHYSICIAN: Minna AntisPaduchowski, Kevin, MD CHIEF COMPLAINT:   Chief Complaint  Patient presents with  . Drug Overdose    Possible overdose on hydrocodone   Confusion since last night HISTORY OF PRESENT ILLNESS:  Yvette Thomas  is a 84 y.o. female with a known history of hypertension, hyperlipidemia, hypothyroidism, urinary incontinence and arthritis.  The patient is sent to ED due to altered mental status since last night.  She was diagnosed with kidney stone with left hydronephrosis in the ED yesterday.  She was given hydrocodone and become confused since last night.  CAT scan of the head is unremarkable.  But she was found hypotension, hypoxia, tachypnea and lactic acidosis.  She is put on oxygen by nasal cannula at 4 L, given vancomycin and Zosyn.  ED physician started sepsis protocol.  Chest x-ray showed cardiomegaly, atelectasis or pneumonia. PAST MEDICAL HISTORY:   Past Medical History:  Diagnosis Date  . Allergic rhinitis, cause unspecified   . Anxiety   . Colon polyps   . Fibrocystic breast   . GERD (gastroesophageal reflux disease)   . Hyperlipidemia   . Hypertension   . Hypothyroidism   . Osteoarthritis of multiple joints   . Osteoporosis   . Pyelitis   . Syncope and collapse    Hx  . Urinary incontinence   . Vertigo     PAST SURGICAL HISTORY:   Past Surgical History:  Procedure Laterality Date  . CATARACT EXTRACTION W/PHACO Right 05/28/2016   Procedure: CATARACT EXTRACTION PHACO AND INTRAOCULAR LENS PLACEMENT (IOC);  Surgeon: Sherald HessAnita Prakash Vin-Parikh, MD;  [facility];  Service: Ophthalmology;  Laterality: Right;  RIGHT  . CATARACT EXTRACTION W/PHACO Left 07/30/2016   Procedure: CATARACT EXTRACTION PHACO AND INTRAOCULAR  LENS PLACEMENT (IOC);  Surgeon: Sherald HessAnita Prakash Vin-Parikh, MD;  [facility];  Service: Ophthalmology;  Laterality: Left;  Left  . THYROIDECTOMY, PARTIAL  1996    SOCIAL HISTORY:   Social History   Tobacco Use  . Smoking status: Former Smoker    Years: 25.00    Last attempt to quit: 07/02/1968    Years since quitting: 49.0  . Smokeless tobacco: Never Used  Substance Use Topics  . Alcohol use: No    FAMILY HISTORY:   Family History  Problem Relation Age of Onset  . Hypertension Sister   . Melanoma Sister   . Heart disease Mother   . Heart disease Father   . Hypertension Brother     DRUG ALLERGIES:   Allergies  Allergen Reactions  . Buspirone Hcl     REACTION: Tinnitis, dizzy  . Oxybutynin Chloride     REACTION: Dried up  . Tolterodine Tartrate     REACTION: Headache    REVIEW OF SYSTEMS:   Review of Systems  Unable to perform ROS: Mental status change    MEDICATIONS AT HOME:   Prior to Admission medications   Medication Sig Start Date End Date Taking? Authorizing Provider  acetaminophen (TYLENOL) 500 MG tablet Take 2 tablets (1,000 mg total) by mouth every 8 (eight) hours for 7 days. 07/12/17 07/19/17 Yes Veronese, [place], MD  benazepril (LOTENSIN) 40 MG tablet Take 1 tablet (40 mg total) by mouth daily. 11/12/16  Yes Karie SchwalbeLetvak, Richard I, MD  Calcium Carbonate-Vitamin D (CALCIUM 600+D) 600-400 MG-UNIT per tablet Take 1 tablet by mouth 2 (two) times daily.     Yes [provider]  Cholecalciferol (VITAMIN D-3 PO) Take 2 tablets by mouth daily.    Yes [provider]  doxazosin (CARDURA) 2 MG tablet TAKE 1 TABLET BY MOUTH DAILY 07/05/17  Yes Karie Schwalbe, MD  ibuprofen (ADVIL,MOTRIN) 200 MG tablet Take 200-400 mg by mouth 2 (two) times daily as needed. For arthritis pain   Yes [provider]  meclizine (ANTIVERT) 25 MG tablet Take 1 tablet (25 mg total) by mouth 3 (three) times daily as needed for dizziness. 12/03/16  Yes  Karie Schwalbe, MD  Multiple Vitamins-Minerals (CENTRUM SILVER PO) Take 1 tablet by mouth daily.     Yes [provider]  Omega-3 Fatty Acids (FISH OIL) 1200 MG CAPS Take by mouth daily.   Yes [provider]  oxyCODONE (ROXICODONE) 5 MG immediate release tablet Take 0.5 tablets (2.5 mg total) by mouth every 4 (four) hours as needed. 07/12/17 07/12/18 Yes Veronese, [place], MD  Red Yeast Rice 600 MG CAPS Take 2 capsules by mouth daily.    Yes [provider]  tamsulosin (FLOMAX) 0.4 MG CAPS capsule Take 1 capsule (0.4 mg total) by mouth daily. 07/12/17  Yes Veronese, [place], MD  Vitamins-Lipotropics (B-50 COMPLEX) TABS Take 1 tablet by mouth daily.     Yes [provider]  ketoconazole (NIZORAL) 2 % cream Apply 1 application topically 2 (two) times daily. Patient not taking: Reported on 07/12/2017 11/12/16   Karie Schwalbe, MD  ondansetron (ZOFRAN ODT) 4 MG disintegrating tablet Take 1 tablet (4 mg total) by mouth every 8 (eight) hours as needed for nausea or vomiting. 07/12/17   Don Perking, [place], MD  sertraline (ZOLOFT) 50 MG tablet TAKE 1 TABLET BY MOUTH DAILY Patient not taking: Reported on 08/01/2017 07/05/17   Karie Schwalbe, MD      VITAL SIGNS:  Blood pressure (!) 90/49, pulse 88, temperature 98.4 F (36.9 C), temperature source Oral, resp. rate (!) 25, height 4\' 11"  (1.499 m), weight 143 lb (64.9 kg), SpO2 94 %.  PHYSICAL EXAMINATION:  Physical Exam  GENERAL:  84 y.o.-year-old patient lying in the bed with no acute distress.  EYES: Pupils equal, round, reactive to light and accommodation. No scleral icterus. Extraocular muscles intact.  HEENT: Head atraumatic, normocephalic. Oropharynx and nasopharynx clear.  NECK:  Supple, no jugular venous distention. No thyroid enlargement, no tenderness.  LUNGS: Normal breath sounds bilaterally, no wheezing, left mild crackles. No use of accessory muscles of respiration.  CARDIOVASCULAR: S1, S2 normal.  No murmurs, rubs, or gallops.  ABDOMEN: Soft, nontender, nondistended. Bowel sounds present. No organomegaly or mass.  EXTREMITIES: No pedal edema, cyanosis, or clubbing.  NEUROLOGIC: Unable to exam. PSYCHIATRIC: The patient is confused. SKIN: No obvious rash, lesion, or ulcer.   LABORATORY PANEL:   CBC Recent Labs  Lab 07/03/2017 0835  WBC 9.7  HGB 11.2*  HCT 34.2*  PLT 72*   ------------------------------------------------------------------------------------------------------------------  Chemistries  Recent Labs  Lab 07/12/2017 0835  NA 136  K 3.9  CL 106  CO2 22  GLUCOSE 90  BUN 34*  CREATININE 2.19*  CALCIUM 8.3*  AST 64*  ALT 31  ALKPHOS 54  BILITOT 1.7*   ------------------------------------------------------------------------------------------------------------------  Cardiac Enzymes No results for input(s): TROPONINI in the last 168 hours. ------------------------------------------------------------------------------------------------------------------  RADIOLOGY:  Ct Head Wo Contrast  Result Date: 07/18/2017 CLINICAL DATA:  Patient found  unresponsive. EXAM: CT HEAD WITHOUT CONTRAST TECHNIQUE: Contiguous axial images were obtained from the base of the skull through the vertex without intravenous contrast. COMPARISON:  October 01, 2013 FINDINGS: Brain: No subdural, epidural, or subarachnoid hemorrhage. Cerebellum, brainstem, and basal cisterns are normal. White matter changes are stable. Ventricles and sulci are unremarkable. No mass effect or midline shift. No acute cortical ischemia or infarct. Vascular: No hyperdense vessel or unexpected calcification. Skull: Normal. Negative for fracture or focal lesion. Sinuses/Orbits: Mild mucosal thickening in the ethmoid sinuses. Paranasal sinuses, mastoid air cells, and middle ears are otherwise normal. Other: There is soft tissue swelling over the left side of the scalp which has increased since 2,015. IMPRESSION: 1. No acute  intracranial abnormality identified. 2. Soft tissue thickening in the left scalp. Recommend clinical correlation. 3. Mild mucosal thickening in the ethmoid sinuses. Electronically Signed   By: Gerome Sam III M.D   On: 07/15/2017 10:40   Dg Chest Portable 1 View  Result Date: 07/12/2017 CLINICAL DATA:  Hypoxia EXAM: PORTABLE CHEST 1 VIEW COMPARISON:  None. FINDINGS: Cardiomegaly. Elevation of the right hemidiaphragm. The left lower lobe airspace opacity, possible pneumonia. Suspect small left effusion. Right base atelectasis. IMPRESSION: Cardiomegaly. Left side effusion with left lower lobe atelectasis or pneumonia. Right base atelectasis. Electronically Signed   By: Charlett Nose M.D.   On: 07/24/2017 09:22   Ct Renal Stone Study  Result Date: 07/12/2017 CLINICAL DATA:  Sharp left flank pain EXAM: CT ABDOMEN AND PELVIS WITHOUT CONTRAST TECHNIQUE: Multidetector CT imaging of the abdomen and pelvis was performed following the standard protocol without IV contrast. COMPARISON:  None. FINDINGS: Lower chest: Cardiomegaly.  No acute findings. Hepatobiliary: No focal hepatic abnormality. Gallbladder unremarkable. Pancreas: No focal abnormality or ductal dilatation. Spleen: No focal abnormality.  Normal size. Adrenals/Urinary Tract: Mild to moderate left hydronephrosis and perinephric stranding due to 7 mm proximal left ureteral stone. No hydronephrosis on the right. Punctate nonobstructing stones in the lower pole of the left kidney. Adrenal glands and urinary bladder unremarkable.5.6 cm low-density lesion off the lower pole of the right kidney likely reflects a cyst although this cannot be characterized without intravenous contrast. Stomach/Bowel: Sigmoid diverticulosis. No active diverticulitis. Appendix is normal. Stomach and small bowel decompressed, unremarkable. Vascular/Lymphatic: Diffuse aortic and iliac calcifications. No evidence of aneurysm or adenopathy. Reproductive: Uterus and adnexa unremarkable.   No mass. Other: No free fluid or free air. Musculoskeletal: No acute bony abnormality. IMPRESSION: 7 mm proximal left ureteral stone with mild to moderate left hydronephrosis and perinephric stranding. Left lower pole punctate nonobstructing nephrolithiasis. Aortoiliac atherosclerosis. Sigmoid diverticulosis. Cardiomegaly. Electronically Signed   By: Charlett Nose M.D.   On: 07/12/2017 12:02      IMPRESSION AND PLAN:   Acute renal failure due to nephrolithiasis with left hydronephrosis The patient will be admitted to medical floor. Follow-up urologist for urologic procedure.  Continue Flomax. IV fluid support and pain control. Follow-up BMP.  Left side effusion with left lower lobe atelectasis or Pneumonia.  Continue antibiotics.  Sepsis due to UTI Follow-up  urine cultures, continue sepsis protocol with vancomycin and Zosyn, follow-up blood culture.  Acute respiratory failure with hypoxia, due to sepsis. Continue oxygen by nasal cannula, nebulizer as needed.  Lactic acidosis.  Due to above.  Continue treatment as above, follow-up lactic acid.  Acute metabolic encephalopathy due to above. Aspiration the fall precaution.  Hypotension.  Hold hypertension medication, start IV fluid support.  Thrombocytopenia.  Unclear etiology, possible due to sepsis. Follow-up CBC.  Hold heparin subcu for DVT prophylaxis, give SCD.  Elevated bilirubin.  Unclear etiology, follow-up level.  Sigmoid diverticulosis.  Per CT.  All the records are reviewed and case discussed with ED provider. Management plans discussed with the patient, her daughter and sister and they are in agreement.  CODE STATUS: DNR  TOTAL TIME TAKING CARE OF THIS PATIENT: 56 minutes.    Shaune Pollack M.D on July 31, 2017 at 11:14 AM  Between 7am to 6pm - Pager - [phone]  After 6pm go to [email or web address] - Social research officer, government  [facility] [place] [facility]  Office  [phone]  CC: Primary care physician; Karie Schwalbe, MD   Note: This dictation was prepared with Dragon dictation along with smaller phrase technology. Any transcriptional errors that result from this process are unin

## 2017-07-13 NOTE — Progress Notes (Signed)
CRITICAL VALUE ALERT  Critical Value:  Troponin- 1.07, Lactic Acid- 2.8  Date & Time Notied:  2125 07/08/2017  Provider Notified: Ms. Luci Bankukov, NP at 2130  Orders Received/Actions taken: Will continue to monitor pt. closely

## 2017-07-13 NOTE — Progress Notes (Signed)
Pulmonary/critical care  Procedure note Central line placement Emergently performed Indication: Hypotension with inadequate central axis requiring pressors Right femoral location chosen Performed under ultrasound guidance Complete contact barrier precautions utilized Topical Hibiclens Subdermal lidocaine Under direct visualization and right femoral vein was cannulated without difficulty Dark nonpulsatile venous return was noted Guidewire was passed and needle was removed Using the Seldinger technique with dilation triple lumen catheter was placed without difficulty Guidewire was removed intact All 3 ports had venous return and were flushed Sutured into place Dressed by nurse  Tora KindredJohn Malasia Torain, D.O.

## 2017-07-13 NOTE — Anesthesia Post-op Follow-up Note (Signed)
Anesthesia QCDR form completed.        

## 2017-07-13 NOTE — ED Notes (Signed)
Patient unable to respond appropriately, pupils pinpoint, suspected accidental overdose on hydrocodone.

## 2017-07-13 NOTE — Progress Notes (Signed)
Alerted Ms. Tukov, NP that pt.'s UOP was 10 mL @ 2100 and 5 mL @ 2200.  Ms. Luci Bankukov, NP ordered 1 L bolus and CVP monitoring, will continue to monitor pt. Closely.

## 2017-07-13 NOTE — ED Notes (Signed)
Family at bedside. 

## 2017-07-13 NOTE — ED Notes (Signed)
Family reporting patient's rolling around and wanted some medication to keep her still. ED provider notified,  due to the circumstances which brought her here provider did not want to sedate her any more.

## 2017-07-13 NOTE — ED Notes (Addendum)
[facility]

## 2017-07-13 NOTE — ED Notes (Signed)
ED provider ordered narcan, 1 mg

## 2017-07-13 NOTE — ED Notes (Signed)
Patient returned from CT

## 2017-07-13 NOTE — Transfer of Care (Signed)
Immediate Anesthesia Transfer of Care Note  Patient: Yvette Thomas  Procedure(s) Performed: CYSTOSCOPY WITH RETROGRADE PYELOGRAM/URETERAL STENT PLACEMENT (Left )  Patient Location: ICU  Anesthesia Type:General  Level of Consciousness: Patient remains intubated per anesthesia plan  Airway & Oxygen Therapy: Patient remains intubated per anesthesia plan  Post-op Assessment: Report given to RN and Post -op Vital signs reviewed and unstable, Anesthesiologist notified  Post vital signs: Reviewed  Last Vitals:  Vitals:   07/05/2017 1521 07/30/2017 1724  BP: (!) 118/54 (!) 85/47  Pulse: 90 88  Resp: 20 12  Temp: (!) 36.4 C   SpO2: 94% 93%    Last Pain:  Vitals:   07/23/2017 1521  TempSrc: Oral  PainSc:          Complications: No apparent anesthesia complications

## 2017-07-13 NOTE — ED Notes (Signed)
Code Sepsis callled

## 2017-07-13 NOTE — ED Notes (Signed)
Date and time results received: 07/31/2017 0943 (use smartphrase ".now" to insert current time)  Test: Lactic Acid Critical Value: 3.3  Name of Provider Notified: Dr. Wynelle BeckmannPadowski  Orders Received? Or Actions Taken?: Actions Taken: notified

## 2017-07-13 NOTE — Consult Note (Signed)
[facility] [place] [facility] Consultation   ASSESSMENT/PLAN   Hydronephrosis with ureteral stone. Status post ureteral stent placement  Septic shock. Most obvious source urinary and gram-negative sepsis. We'll place on norepinephrine, will place central line, fluid resuscitation, may require the addition of vasopressin and hydrocortisone if blood pressure does not improve.  Respiratory failure. Initially she was hypoxemic postoperatively. Will obtain chest x-ray assess for pneumonia.  Renal failure. Hydronephrosis, postobstructive uropathy, status post ureteral stent placement, rehydration, sepsis-induced possible ATN. Will follow BUN/creatinine closely  Coagulopathy. Elevated INR and PTT, concerning for DIC will send DIC panel  Acid-based derangement. Lactic acidosis noted most likely secondary to sepsis and hypoperfusion.  Critical care time 35 minutes  Name: JESYKA SLAGHT MRN: [id number] DOB: 1925/03/22    ADMISSION DATE:  07/30/2017 CONSULTATION DATE:  07/09/2017  REFERRING MD :  Hospitalist  CHIEF COMPLAINT:  Status post ureteral stent placement   HISTORY OF PRESENT ILLNESS:  Mrs. Pociask is a 84 year old Caucasian female with a past medical history remarkable for hypertension, hyperlipidemia, gastroesophageal reflux disease, anxiety, hypothyroidism, osteoarthritis, urinary incontinence, was brought to the emergency room secondary to altered mental status. She had been given hydrocodone and was confused and felt to initially have been side effects from medication however was found to have a left hydronephrosis, left ureteral stone. She was noted to be hypotensive, hypoxemic, tachycardic with lactic acidosis. Was initially admitted to the floor place on vancomycin and Zosyn and 4 L nasal cannula. Was seen by urology and taken to the operating room where she had a stent placed. Intraoperatively she was hypotensive with difficulty to oxygenate, received multiple boluses of  epinephrine, was left intubated and transferred to the intensive care unit. Presently she is hypotensive, giving boluses of pressors, only peripheral IVs, hypoxemic.  PAST MEDICAL HISTORY :  Past Medical History:  Diagnosis Date  . Allergic rhinitis, cause unspecified   . Anxiety   . Colon polyps   . Fibrocystic breast   . GERD (gastroesophageal reflux disease)   . Hyperlipidemia   . Hypertension   . Hypothyroidism   . Osteoarthritis of multiple joints   . Osteoporosis   . Pyelitis   . Syncope and collapse    Hx  . Urinary incontinence   . Vertigo    Past Surgical History:  Procedure Laterality Date  . CATARACT EXTRACTION W/PHACO Right 05/28/2016   Procedure: CATARACT EXTRACTION PHACO AND INTRAOCULAR LENS PLACEMENT (IOC);  Surgeon: Sherald Hess, MD;  Location: [facility];  Service: Ophthalmology;  Laterality: Right;  RIGHT  . CATARACT EXTRACTION W/PHACO Left 07/30/2016   Procedure: CATARACT EXTRACTION PHACO AND INTRAOCULAR LENS PLACEMENT (IOC);  Surgeon: Sherald Hess, MD;  Location: [facility];  Service: Ophthalmology;  Laterality: Left;  Left  . THYROIDECTOMY, PARTIAL  1996   Prior to Admission medications   Medication Sig Start Date End Date Taking? Authorizing Provider  acetaminophen (TYLENOL) 500 MG tablet Take 2 tablets (1,000 mg total) by mouth every 8 (eight) hours for 7 days. 07/12/17 07/19/17 Yes Veronese, [place], MD  benazepril (LOTENSIN) 40 MG tablet Take 1 tablet (40 mg total) by mouth daily. 11/12/16  Yes Karie Schwalbe, MD  Calcium Carbonate-Vitamin D (CALCIUM 600+D) 600-400 MG-UNIT per tablet Take 1 tablet by mouth 2 (two) times daily.     Yes [provider]  Cholecalciferol (VITAMIN D-3 PO) Take 2 tablets by mouth daily.    Yes [provider]  doxazosin (CARDURA) 2 MG tablet TAKE 1 TABLET BY  MOUTH DAILY 07/05/17  Yes Karie SchwalbeLetvak, Richard I, MD  ibuprofen (ADVIL,MOTRIN) 200 MG tablet Take 200-400 mg by mouth 2  (two) times daily as needed. For arthritis pain   Yes [provider]  meclizine (ANTIVERT) 25 MG tablet Take 1 tablet (25 mg total) by mouth 3 (three) times daily as needed for dizziness. 12/03/16  Yes Karie SchwalbeLetvak, Richard I, MD  Multiple Vitamins-Minerals (CENTRUM SILVER PO) Take 1 tablet by mouth daily.     Yes [provider]  Omega-3 Fatty Acids (FISH OIL) 1200 MG CAPS Take by mouth daily.   Yes [provider]  oxyCODONE (ROXICODONE) 5 MG immediate release tablet Take 0.5 tablets (2.5 mg total) by mouth every 4 (four) hours as needed. 07/12/17 07/12/18 Yes Veronese, [place], MD  Red Yeast Rice 600 MG CAPS Take 2 capsules by mouth daily.    Yes [provider]  tamsulosin (FLOMAX) 0.4 MG CAPS capsule Take 1 capsule (0.4 mg total) by mouth daily. 07/12/17  Yes Veronese, [place], MD  Vitamins-Lipotropics (B-50 COMPLEX) TABS Take 1 tablet by mouth daily.     Yes [provider]  ketoconazole (NIZORAL) 2 % cream Apply 1 application topically 2 (two) times daily. Patient not taking: Reported on 07/12/2017 11/12/16   Karie SchwalbeLetvak, Richard I, MD  ondansetron (ZOFRAN ODT) 4 MG disintegrating tablet Take 1 tablet (4 mg total) by mouth every 8 (eight) hours as needed for nausea or vomiting. 07/12/17   Don PerkingVeronese, [place], MD  sertraline (ZOLOFT) 50 MG tablet TAKE 1 TABLET BY MOUTH DAILY Patient not taking: Reported on 07/06/2017 07/05/17   Karie SchwalbeLetvak, Richard I, MD   Allergies  Allergen Reactions  . Buspirone Hcl     REACTION: Tinnitis, dizzy  . Oxybutynin Chloride     REACTION: Dried up  . Tolterodine Tartrate     REACTION: Headache    FAMILY HISTORY:  Family History  Problem Relation Age of Onset  . Hypertension Sister   . Melanoma Sister   . Heart disease Mother   . Heart disease Father   . Hypertension Brother    SOCIAL HISTORY:  reports that she quit smoking about 49 years ago. She quit after 25.00 years of use. she has never used smokeless tobacco. She reports  that she does not drink alcohol or use drugs.  REVIEW OF SYSTEMS:   Review of systems unable to be obtained at this time  VITAL SIGNS: Temp:  [97.5 F (36.4 C)-98.4 F (36.9 C)] 97.5 F (36.4 C) (01/12 1521) Pulse Rate:  [85-97] 88 (01/12 1724) Resp:  [12-25] 12 (01/12 1724) BP: (63-118)/(36-73) 85/47 (01/12 1724) SpO2:  [85 %-97 %] 93 % (01/12 1724) Weight:  [64.9 kg (143 lb)-66.7 kg (147 lb)] 66.7 kg (147 lb) (01/12 1208) HEMODYNAMICS:    INTAKE / OUTPUT:  Intake/Output Summary (Last 24 hours) at 07/05/2017 1745 Last data filed at 07/12/2017 1725 Gross per 24 hour  Intake 200 ml  Output -  Net 200 ml    Physical Examination:   VS: BP (!) 85/47   Pulse 88   Temp (!) 97.5 F (36.4 C) (Oral)   Resp 12   Ht 4\' 11"  (1.499 m)   Wt 66.7 kg (147 lb)   SpO2 93%   BMI 29.69 kg/m   General Appearance: Patient is orally intubated, hypotensive, brought back from the operating room Neuro: Limited neurologic exam postoperatively HEENT: Patient is orally intubated, trachea is midline, no jugular venous distention is appreciated  Pulmonary: normal breath sounds., diaphragmatic excursion  normal. Cardiovascular irregularly irregular rhythm with atrial fibrillation noted on monitor  Abdomen: Soft exam, hypoactive bowel sounds Skin:   warm, no rashes, no ecchymosis  Extremities: normal, no cyanosis, clubbing, no edema, warm with normal capillary refill.   LABS: Reviewed  LABORATORY PANEL:   CBC Recent Labs  Lab 2017-07-14 0835  WBC 9.7  HGB 11.2*  HCT 34.2*  PLT 72*    Chemistries  Recent Labs  Lab 2017/07/14 0835 07/14/2017 1247  NA 136  --   K 3.9  --   CL 106  --   CO2 22  --   GLUCOSE 90  --   BUN 34*  --   CREATININE 2.19* 2.38*  CALCIUM 8.3*  --   AST 64*  --   ALT 31  --   ALKPHOS 54  --   BILITOT 1.7*  --     No results for input(s): GLUCAP in the last 168 hours. Recent Labs  Lab Jul 14, 2017 1730  PHART 7.24*  PCO2ART 37  PO2ART 156*   Recent Labs    Lab 07-14-17 0835  AST 64*  ALT 31  ALKPHOS 54  BILITOT 1.7*  ALBUMIN 3.2*    Cardiac Enzymes No results for input(s): TROPONINI in the last 168 hours.  RADIOLOGY:  Ct Head Wo Contrast  Result Date: 07/14/17 CLINICAL DATA:  Patient found unresponsive. EXAM: CT HEAD WITHOUT CONTRAST TECHNIQUE: Contiguous axial images were obtained from the base of the skull through the vertex without intravenous contrast. COMPARISON:  October 01, 2013 FINDINGS: Brain: No subdural, epidural, or subarachnoid hemorrhage. Cerebellum, brainstem, and basal cisterns are normal. White matter changes are stable. Ventricles and sulci are unremarkable. No mass effect or midline shift. No acute cortical ischemia or infarct. Vascular: No hyperdense vessel or unexpected calcification. Skull: Normal. Negative for fracture or focal lesion. Sinuses/Orbits: Mild mucosal thickening in the ethmoid sinuses. Paranasal sinuses, mastoid air cells, and middle ears are otherwise normal. Other: There is soft tissue swelling over the left side of the scalp which has increased since 2,015. IMPRESSION: 1. No acute intracranial abnormality identified. 2. Soft tissue thickening in the left scalp. Recommend clinical correlation. 3. Mild mucosal thickening in the ethmoid sinuses. Electronically Signed   By: Gerome Sam III M.D   On: Jul 14, 2017 10:40   Dg Chest Portable 1 View  Result Date: 2017/07/14 CLINICAL DATA:  Hypoxia EXAM: PORTABLE CHEST 1 VIEW COMPARISON:  None. FINDINGS: Cardiomegaly. Elevation of the right hemidiaphragm. The left lower lobe airspace opacity, possible pneumonia. Suspect small left effusion. Right base atelectasis. IMPRESSION: Cardiomegaly. Left side effusion with left lower lobe atelectasis or pneumonia. Right base atelectasis. Electronically Signed   By: Charlett Nose M.D.   On: July 14, 2017 09:22   Ct Renal Stone Study  Result Date: 07/12/2017 CLINICAL DATA:  Sharp left flank pain EXAM: CT ABDOMEN AND PELVIS  WITHOUT CONTRAST TECHNIQUE: Multidetector CT imaging of the abdomen and pelvis was performed following the standard protocol without IV contrast. COMPARISON:  None. FINDINGS: Lower chest: Cardiomegaly.  No acute findings. Hepatobiliary: No focal hepatic abnormality. Gallbladder unremarkable. Pancreas: No focal abnormality or ductal dilatation. Spleen: No focal abnormality.  Normal size. Adrenals/Urinary Tract: Mild to moderate left hydronephrosis and perinephric stranding due to 7 mm proximal left ureteral stone. No hydronephrosis on the right. Punctate nonobstructing stones in the lower pole of the left kidney. Adrenal glands and urinary bladder unremarkable.5.6 cm low-density lesion off the lower pole of the right kidney likely reflects a cyst although this cannot  be characterized without intravenous contrast. Stomach/Bowel: Sigmoid diverticulosis. No active diverticulitis. Appendix is normal. Stomach and small bowel decompressed, unremarkable. Vascular/Lymphatic: Diffuse aortic and iliac calcifications. No evidence of aneurysm or adenopathy. Reproductive: Uterus and adnexa unremarkable.  No mass. Other: No free fluid or free air. Musculoskeletal: No acute bony abnormality. IMPRESSION: 7 mm proximal left ureteral stone with mild to moderate left hydronephrosis and perinephric stranding. Left lower pole punctate nonobstructing nephrolithiasis. Aortoiliac atherosclerosis. Sigmoid diverticulosis. Cardiomegaly. Electronically Signed   By: Charlett Nose M.D.   On: 07/12/2017 12:02    Tora Kindred, DO  08/01/2017, 5:45 PM

## 2017-07-13 NOTE — ED Provider Notes (Signed)
[facility] Emergency Department Provider Note  Time seen: 8:42 AM  I have reviewed the triage vital signs and the nursing notes.   HISTORY  Chief Complaint Drug Overdose (Possible overdose on hydrocodone)    HPI Yvette Thomas is a 84 y.o. female with a past medical history of anxiety, gastric reflux, hypertension, hyperlipidemia, presents to the emergency department for altered mental status.  According to record review and EMS report patient was seen in the emergency department yesterday diagnosed with a kidney stone sent home with hydrocodone.  The family states they found the patient this morning lying on her bed with her clothes on, occasionally moaning or will answer a question, and accurately appear to be very confused and EMS was called.  Upon arrival patient is acting more of the same, she is awake, eyes are open she will answer yes or no questions but often inaccurate.  Patient cannot tell me what year it is but was able to tell me that she was in the hospital.  Could not tell me why she is here.   Past Medical History:  Diagnosis Date  . Allergic rhinitis, cause unspecified   . Anxiety   . Colon polyps   . Fibrocystic breast   . GERD (gastroesophageal reflux disease)   . Hyperlipidemia   . Hypertension   . Hypothyroidism   . Osteoarthritis of multiple joints   . Osteoporosis   . Pyelitis   . Syncope and collapse    Hx  . Urinary incontinence   . Vertigo     Patient Active Problem List   Diagnosis Date Noted  . Epistaxis 07/10/2016  . Kyphoscoliosis 11/16/2015  . Advanced directives, counseling/discussion 04/27/2014  . Generalized anxiety disorder 02/19/2014  . Abnormal EKG 10/02/2013  . Hyperlipidemia 10/02/2013  . Vertigo 01/13/2013  . Routine general medical examination at a health care facility 10/13/2012  . Episodic mood disorder (HCC)   . Allergic rhinitis, cause unspecified   . Osteoarthritis of multiple joints   . URINARY  INCONTINENCE 04/02/2007  . Essential hypertension 03/17/2007  . GERD 03/17/2007  . FIBROCYSTIC BREAST DISEASE 03/17/2007  . OSTEOPOROSIS 03/17/2007  . COLONIC POLYPS, HX OF 03/17/2007    Past Surgical History:  Procedure Laterality Date  . CATARACT EXTRACTION W/PHACO Right 05/28/2016   Procedure: CATARACT EXTRACTION PHACO AND INTRAOCULAR LENS PLACEMENT (IOC);  Surgeon: Sherald Hess, MD;  Location: [facility];  Service: Ophthalmology;  Laterality: Right;  RIGHT  . CATARACT EXTRACTION W/PHACO Left 07/30/2016   Procedure: CATARACT EXTRACTION PHACO AND INTRAOCULAR LENS PLACEMENT (IOC);  Surgeon: Sherald Hess, MD;  Location: [facility];  Service: Ophthalmology;  Laterality: Left;  Left  . THYROIDECTOMY, PARTIAL  1996    Prior to Admission medications   Medication Sig Start Date End Date Taking? Authorizing Provider  acetaminophen (TYLENOL) 500 MG tablet Take 2 tablets (1,000 mg total) by mouth every 8 (eight) hours for 7 days. 07/12/17 07/19/17  Nita Sickle, MD  benazepril (LOTENSIN) 40 MG tablet Take 1 tablet (40 mg total) by mouth daily. 11/12/16   Karie Schwalbe, MD  Calcium Carbonate-Vitamin D (CALCIUM 600+D) 600-400 MG-UNIT per tablet Take 1 tablet by mouth 2 (two) times daily.      [provider]  Cholecalciferol (VITAMIN D-3 PO) Take 2 tablets by mouth daily.     [provider]  doxazosin (CARDURA) 2 MG tablet TAKE 1 TABLET BY MOUTH DAILY 07/05/17   Karie Schwalbe, MD  ibuprofen (  ADVIL,MOTRIN) 200 MG tablet Take 200-400 mg by mouth 2 (two) times daily as needed. For arthritis pain    [provider]  ketoconazole (NIZORAL) 2 % cream Apply 1 application topically 2 (two) times daily. Patient not taking: Reported on 07/12/2017 11/12/16   Karie SchwalbeLetvak, Richard I, MD  meclizine (ANTIVERT) 25 MG tablet Take 1 tablet (25 mg total) by mouth 3 (three) times daily as needed for dizziness. 12/03/16   Karie SchwalbeLetvak, Richard I, MD   Multiple Vitamins-Minerals (CENTRUM SILVER PO) Take 1 tablet by mouth daily.      [provider]  Omega-3 Fatty Acids (FISH OIL) 1200 MG CAPS Take by mouth daily.    [provider]  ondansetron (ZOFRAN ODT) 4 MG disintegrating tablet Take 1 tablet (4 mg total) by mouth every 8 (eight) hours as needed for nausea or vomiting. 07/12/17   Don PerkingVeronese, [place], MD  oxyCODONE (ROXICODONE) 5 MG immediate release tablet Take 0.5 tablets (2.5 mg total) by mouth every 4 (four) hours as needed. 07/12/17 07/12/18  Nita SickleVeronese, [place], MD  Red Yeast Rice 600 MG CAPS Take 2 capsules by mouth daily.     [provider]  sertraline (ZOLOFT) 50 MG tablet TAKE 1 TABLET BY MOUTH DAILY 07/05/17   Tillman AbideLetvak, Richard I, MD  tamsulosin (FLOMAX) 0.4 MG CAPS capsule Take 1 capsule (0.4 mg total) by mouth daily. 07/12/17   Nita SickleVeronese, [place], MD  Vitamins-Lipotropics (B-50 COMPLEX) TABS Take 1 tablet by mouth daily.      [provider]    Allergies  Allergen Reactions  . Buspirone Hcl     REACTION: Tinnitis, dizzy  . Oxybutynin Chloride     REACTION: Dried up  . Tolterodine Tartrate     REACTION: Headache    Family History  Problem Relation Age of Onset  . Hypertension Sister   . Melanoma Sister   . Heart disease Mother   . Heart disease Father   . Hypertension Brother     Social History Social History   Tobacco Use  . Smoking status: Former Smoker    Years: 25.00    Last attempt to quit: 07/02/1968    Years since quitting: 49.0  . Smokeless tobacco: Never Used  Substance Use Topics  . Alcohol use: No  . Drug use: No    Review of Systems Unable to complete a review of systems due to altered mental status/confusion  ____________________________________________   PHYSICAL EXAM:  VITAL SIGNS: ED Triage Vitals  Enc Vitals Group     BP 07/11/2017 0833 (!) 92/46     Pulse Rate 07/04/2017 0833 97     Resp 07/27/2017 0833 (!) 22     Temp --      Temp src --      SpO2  07/17/2017 0833 (!) 85 %     Weight 07/09/2017 0835 143 lb (64.9 kg)     Height 07/07/2017 0835 4\' 11"  (1.499 m)     Head Circumference --      Peak Flow --      Pain Score 07/30/2017 0833 0     Pain Loc --      Pain Edu? --      Excl. in GC? --     Constitutional: Alert, eyes are opening, patient will moan or answer yes/no questions occasionally but often inaccurate.  Oriented to person and place, but not time Eyes: 1-2 mm pupils bilaterally ENT   Head: Normocephalic and atraumatic.   Nose: No congestion/rhinnorhea.  Mouth/Throat: Dry mucous membranes. Cardiovascular: Normal rate, regular rhythm.  Respiratory: Normal respiratory effort without tachypnea nor retractions. Breath sounds are clear  Gastrointestinal: Soft and nontender. No distention.  Musculoskeletal: Nontender with normal range of motion in all extremities.  Neurologic:  Normal speech and language. No gross focal neurologic deficits Skin:  Skin is warm, dry and intact.  Psychiatric: Mood and affect are normal.  ____________________________________________    EKG  EKG reviewed and interpreted by myself shows normal sinus rhythm 88 bpm with a widened QRS, normal axis, normal intervals nonspecific ST changes.  ____________________________________________    RADIOLOGY  CT negative for acute abnormality X-ray shows possible left lower lobe atelectasis versus pneumonia  ____________________________________________   INITIAL IMPRESSION / ASSESSMENT AND PLAN / ED COURSE  Pertinent labs & imaging results that were available during my care of the patient were reviewed by me and considered in my medical decision making (see chart for details).  Patient presents to the emergency department for confusion/altered mental status.  I reviewed the patient's records, she was seen in the emergency department yesterday diagnosed with a 7 mm left proximal ureteral stone.  Patient was discharged with hydrocodone.   Differential would include opioid reaction/accidental overdose, infection, pyelonephritis, sepsis.  Patient given a total of 1 mg of Narcan on arrival, minimal to no response to the Narcan.  We will check labs, urinalysis, urine culture, dose fluids and continue to closely monitor in the emergency department while awaiting results.  Patient's urinalysis from yesterday did not appear to show any infection, urine culture was sent yesterday but is not yet resulted.  CT showed a 7 mm left proximal ureteral stone with stranding but no other acute findings.  Patient's vitals have shown hypoxia 85% on room air, satting in the mid upper 90s on 2 L of oxygen.  Patient is hypotensive 92/46 and somewhat tachycardic at 97 bpm.  Afebrile 98.7 orally.  Patient has had no response to the Narcan.  As the patient is confused with a known proximal kidney stone and perinephric stranding I have initiated sepsis protocols, broad-spectrum antibiotics for possible sepsis.   Patient's lactate has resulted at 3.3.  Creatinine is elevated from 0.9 to now 2.19.  We will continue with IV hydration.  I discussed with urology they will be in to see the patient later today to decide upon stent placement.  We will continue with medical treatment receiving IV antibiotics.  Urinalysis pending.  Patient will be admitted to the hospitalist service for further treatment.   CRITICAL CARE Performed by: Minna Antis   Total critical care time: 30 minutes  Critical care time was exclusive of separately billable procedures and treating other patients.  Critical care was necessary to treat or prevent imminent or life-threatening deterioration.  Critical care was time spent personally by me on the following activities: development of treatment plan with patient and/or surrogate as well as nursing, discussions with consultants, evaluation of patient's response to treatment, examination of patient, obtaining history from patient or  surrogate, ordering and performing treatments and interventions, ordering and review of laboratory studies, ordering and review of radiographic studies, pulse oximetry and re-evaluation of patient's condition.   ____________________________________________   FINAL CLINICAL IMPRESSION(S) / ED DIAGNOSES  Altered mental status Sepsis Acute renal failure   Minna Antis, MD 07/11/2017 1057

## 2017-07-13 NOTE — ED Notes (Signed)
ED provider at bedside.

## 2017-07-13 NOTE — Progress Notes (Signed)
CODE SEPSIS - PHARMACY COMMUNICATION  **Broad Spectrum Antibiotics should be administered within 1 hour of Sepsis diagnosis**  Time Code Sepsis Called/Page Received: 2017/12/29  0900  Antibiotics Ordered: Zosyn/Vancomycin   Time of 1st antibiotic administration: 0912  Additional action taken by pharmacy: called RN Vonna KotykJay re: Vanc. Patient taken to CT before could hang Vanc. Now hanging  If necessary, Name of Provider/Nurse Contacted:Jay    Angelique BlonderMerrill,Brodin Gelpi A ,PharmD Clinical Pharmacist  07-24-17  10:16 AM

## 2017-07-14 LAB — BASIC METABOLIC PANEL
Anion gap: 8 (ref 5–15)
BUN: 45 mg/dL — ABNORMAL HIGH (ref 6–20)
CO2: 19 mmol/L — ABNORMAL LOW (ref 22–32)
Calcium: 7.2 mg/dL — ABNORMAL LOW (ref 8.9–10.3)
Chloride: 111 mmol/L (ref 101–111)
Creatinine, Ser: 2.49 mg/dL — ABNORMAL HIGH (ref 0.44–1.00)
GFR calc Af Amer: 19 mL/min — ABNORMAL LOW (ref >60)
GFR, EST NON AFRICAN AMERICAN: 16 mL/min — AB (ref >60)
Glucose, Bld: 102 mg/dL — ABNORMAL HIGH (ref 65–99)
POTASSIUM: 4.9 mmol/L (ref 3.5–5.1)
Sodium: 138 mmol/L (ref 135–145)

## 2017-07-14 LAB — BLOOD CULTURE ID PANEL (REFLEXED)
ACINETOBACTER BAUMANNII: NOT DETECTED
CANDIDA ALBICANS: NOT DETECTED
CANDIDA KRUSEI: NOT DETECTED
CANDIDA PARAPSILOSIS: NOT DETECTED
CARBAPENEM RESISTANCE: NOT DETECTED
Candida glabrata: NOT DETECTED
Candida tropicalis: NOT DETECTED
ENTEROBACTER CLOACAE COMPLEX: NOT DETECTED
ENTEROCOCCUS SPECIES: NOT DETECTED
ESCHERICHIA COLI: DETECTED — AB
Enterobacteriaceae species: DETECTED — AB
Haemophilus influenzae: NOT DETECTED
KLEBSIELLA OXYTOCA: NOT DETECTED
KLEBSIELLA PNEUMONIAE: NOT DETECTED
LISTERIA MONOCYTOGENES: NOT DETECTED
Neisseria meningitidis: NOT DETECTED
PSEUDOMONAS AERUGINOSA: NOT DETECTED
Proteus species: NOT DETECTED
STAPHYLOCOCCUS AUREUS BCID: NOT DETECTED
STREPTOCOCCUS PNEUMONIAE: NOT DETECTED
STREPTOCOCCUS PYOGENES: NOT DETECTED
Serratia marcescens: NOT DETECTED
Staphylococcus species: NOT DETECTED
Streptococcus agalactiae: NOT DETECTED
Streptococcus species: NOT DETECTED

## 2017-07-14 LAB — URINE CULTURE: Culture: >=100000 — AB

## 2017-07-14 LAB — GLUCOSE, CAPILLARY
GLUCOSE-CAPILLARY: 100 mg/dL — AB (ref 65–99)
Glucose-Capillary: 106 mg/dL — ABNORMAL HIGH (ref 65–99)
Glucose-Capillary: 76 mg/dL (ref 65–99)
Glucose-Capillary: 76 mg/dL (ref 65–99)
Glucose-Capillary: 77 mg/dL (ref 65–99)
Glucose-Capillary: 90 mg/dL (ref 65–99)

## 2017-07-14 LAB — CBC
HEMATOCRIT: 33.6 % — AB (ref 35.0–47.0)
HEMOGLOBIN: 10.9 g/dL — AB (ref 12.0–16.0)
MCH: 30 pg (ref 26.0–34.0)
MCHC: 32.5 g/dL (ref 32.0–36.0)
MCV: 92.3 fL (ref 80.0–100.0)
Platelets: 54 10*3/uL — ABNORMAL LOW (ref 150–440)
RBC: 3.64 MIL/uL — ABNORMAL LOW (ref 3.80–5.20)
RDW: 14.4 % (ref 11.5–14.5)
WBC: 22 10*3/uL — ABNORMAL HIGH (ref 3.6–11.0)

## 2017-07-14 LAB — BILIRUBIN, TOTAL: Total Bilirubin: 1.7 mg/dL — ABNORMAL HIGH (ref 0.3–1.2)

## 2017-07-14 MED ORDER — SODIUM CHLORIDE 0.9 % IV SOLN
500.0000 mg | Freq: Two times a day (BID) | INTRAVENOUS | Status: DC
  Administered 2017-07-14 – 2017-07-15 (×3): 500 mg via INTRAVENOUS
  Filled 2017-07-14 (×4): qty 0.5

## 2017-07-14 MED ORDER — MORPHINE SULFATE (PF) 2 MG/ML IV SOLN
INTRAVENOUS | Status: AC
  Filled 2017-07-14: qty 1

## 2017-07-14 MED ORDER — SODIUM CHLORIDE 0.9 % IV BOLUS (SEPSIS)
500.0000 mL | INTRAVENOUS | Status: AC
  Administered 2017-07-14 (×2): 500 mL via INTRAVENOUS

## 2017-07-14 MED ORDER — MORPHINE SULFATE (PF) 2 MG/ML IV SOLN
1.0000 mg | INTRAVENOUS | Status: DC | PRN
  Administered 2017-07-14 – 2017-07-15 (×3): 1 mg via INTRAVENOUS
  Administered 2017-07-15 (×2): 2 mg via INTRAVENOUS
  Filled 2017-07-14 (×3): qty 1

## 2017-07-14 MED ORDER — PANTOPRAZOLE SODIUM 40 MG IV SOLR
40.0000 mg | Freq: Every day | INTRAVENOUS | Status: DC
  Administered 2017-07-14: 40 mg via INTRAVENOUS
  Filled 2017-07-14: qty 40

## 2017-07-14 MED ORDER — DEXMEDETOMIDINE HCL IN NACL 400 MCG/100ML IV SOLN
0.4000 ug/kg/h | INTRAVENOUS | Status: DC
  Administered 2017-07-14: 0.4 ug/kg/h via INTRAVENOUS
  Administered 2017-07-14 (×2): 0.2 ug/kg/h via INTRAVENOUS
  Administered 2017-07-14: 0.6 ug/kg/h via INTRAVENOUS
  Administered 2017-07-15: 0.7 ug/kg/h via INTRAVENOUS
  Administered 2017-07-15: 0.6 ug/kg/h via INTRAVENOUS
  Administered 2017-07-15: 1 ug/kg/h via INTRAVENOUS
  Administered 2017-07-15 – 2017-07-16 (×3): 1.2 ug/kg/h via INTRAVENOUS
  Filled 2017-07-14 (×7): qty 100

## 2017-07-14 NOTE — Progress Notes (Signed)
Pt extubated to 3l [place] with sats of 93-94%, will continue to monitor.

## 2017-07-14 NOTE — Progress Notes (Signed)
Patient has been agitated, disoriented, confused, and impulsive. Sats dropped on 4 L [place]. RT changed to mask. Sats back at baseline low 90s. Precedex turned up for sedation.

## 2017-07-14 NOTE — Progress Notes (Signed)
Pt.'s BP and UOP improved slightly post bolus, then dropped below 20 mL/h consistently. Alerted Ms. Luci Bankukov, NP, second 1 L bolus ordered see MAR for details. Will continue to monitor pt. Closely.

## 2017-07-14 NOTE — Progress Notes (Addendum)
Pharmacy Antibiotic Note  Yvette Thomas is a 84 y.o. female admitted on 11-23-2017 with sepsis, ?pneumonia, UTI  Pharmacy has been consulted for Vancomycin and Zosyn dosing.  Plan: Patient received Zosyn 3.375 gm IV x 1 in ER and Vancomycin 1 gram IV x 1 in ER. -continue Zosyn EI 3.375gm IV q12h for Crcl < 20 ml/min.  -Vancomycin: With Crcl 13 ml/min in 84 yo, will dose Vancomycin per levels with unstable renal fxn (Scr in ER on 07/12/17 = 0.99, now 2.38). Wt 66.7 kg  Ht 784ft 11 in    Adj BW= 53.1 kg. Will order Random Vancomycin level on 07/14/17 at 1000 (24 hrs after 1st dose).   1/13 Zosyn changed to meropenem for BCID + for E coli   Height: 5\' 2"  (157.5 cm) Weight: 155 lb 10.3 oz (70.6 kg) IBW/kg (Calculated) : 50.1  Temp (24hrs), Avg:97.5 F (36.4 C), Min:96.3 F (35.7 C), Max:98.4 F (36.9 C)  Recent Labs  Lab 07/12/17 1030 10-Jun-2018 0835 10-Jun-2018 1031 10-Jun-2018 1247 10-Jun-2018 2039 07/14/17 0347  WBC 11.9* 9.7  --   --  19.1* 22.0*  CREATININE 0.99 2.19*  --  2.38* 2.32* 2.49*  LATICACIDVEN  --  3.3* 3.1*  --  2.8*  --     Estimated Creatinine Clearance: 13.8 mL/min (A) (by C-G formula based on SCr of 2.49 mg/dL (H)).    Allergies  Allergen Reactions  . Buspirone Hcl     REACTION: Tinnitis, dizzy  . Oxybutynin Chloride     REACTION: Dried up  . Tolterodine Tartrate     REACTION: Headache    Antimicrobials this admission: Vanc 1/12 >>   Zosyn 1/12 >>    Dose adjustments this admission:    Microbiology results: 1/12 BCx: pending 1/12 UCx: pending    Sputum:      MRSA PCR:  pending  Thank you for allowing pharmacy to be a part of this patient's care.  Kaden Dunkel S 07/14/2017 5:05 AM

## 2017-07-14 NOTE — Progress Notes (Signed)
[facility] [place] [facility] Medicine Progess Note    SYNOPSIS   Mrs. Yvette Thomas is a 84 year old Caucasian female with a past medical history remarkable for hypertension, hyperlipidemia, gastroesophageal reflux disease, anxiety, hypothyroidism, osteoarthritis, urinary incontinence, was brought to the emergency room secondary to altered mental status, found to have a left hydronephrosis, left ureteral stone, S/P stent placed.  ASSESSMENT/PLAN   Sepsis. Escherichia coli growing in blood culture, presently on meropenem, status post left hydronephrosis, ureteral stone, status post replacement. Stable hemodynamics is not required hydrocortisone  Respiratory failure. Presently now awake and alert, will perform spontaneous awakening and breathing trial and evaluate for extubation.  Leukocytosis. Secondary to sepsis  Renal insufficiency. Creatinine continuing to slowly rise last measured at 2.49   VENTILATOR SETTINGS: Vent Mode: PRVC FiO2 (%):  [60 %-100 %] 60 % Set Rate:  [15 bmp] 15 bmp Vt Set:  [450 mL] 450 mL PEEP:  [5 cmH20] 5 cmH20 Plateau Pressure:  [14 cmH20] 14 cmH20  INTAKE / OUTPUT:  Intake/Output Summary (Last 24 hours) at 07/14/2017 0908 Last data filed at 07/14/2017 0741 Gross per 24 hour  Intake 4691.64 ml  Output 146 ml  Net 4545.64 ml    Name: Yvette Thomas MRN: [id number] DOB: 1925-05-10    ADMISSION DATE:  Nov 13, 2017  SUBJECTIVE:   Patient has done well overnight, did not require pressors. Responded to fluid resuscitation, presently on meropenem. Blood cultures reveal Escherichia coli sepsis  VITAL SIGNS: Temp:  [96.3 F (35.7 C)-98.1 F (36.7 C)] 98 F (36.7 C) (01/13 0700) Pulse Rate:  [64-91] 69 (01/13 0700) Resp:  [3-25] 14 (01/13 0700) BP: (68-132)/(37-79) 104/52 (01/13 0700) SpO2:  [94 %-100 %] 98 % (01/13 0700) FiO2 (%):  [60 %-100 %] 60 % (01/13 0400) Weight:  [66.7 kg (147 lb)-70.6 kg (155 lb 10.3 oz)] 70.6 kg (155 lb 10.3 oz) (01/12  1800)  PHYSICAL EXAMINATION: Physical Examination:   VS: BP (!) 104/52   Pulse 69   Temp 98 F (36.7 C)   Resp 14   Ht 5\' 2"  (1.575 m)   Wt 70.6 kg (155 lb 10.3 oz)   SpO2 98%   BMI 28.47 kg/m   General Appearance: Presently somewhat agitated on spontaneous breathing trial HEENT: PERRLA, EOM intact. Pulmonary: normal breath sounds   CardiovascularNormal S1,S2.  No m/r/g.   Abdomen: Benign, Soft, non-tender. Skin:   warm, no rashes, no ecchymosis  Extremities: normal, no cyanosis, clubbing.  LABORATORY PANEL:   CBC Recent Labs  Lab 07/14/17 0347  WBC 22.0*  HGB 10.9*  HCT 33.6*  PLT 54*    Chemistries  Recent Labs  Lab 07-03-2017 2039 07/14/17 0347  NA 135 138  K 5.0 4.9  CL 107 111  CO2 20* 19*  GLUCOSE 160* 102*  BUN 41* 45*  CREATININE 2.32* 2.49*  CALCIUM 7.6* 7.2*  MG 1.6*  --   PHOS 4.7*  --   AST 114*  --   ALT 62*  --   ALKPHOS 63  --   BILITOT 3.0* 1.7*    Recent Labs  Lab 07-03-2017 1727 07-03-2017 1800 07-03-2017 2036 07/14/17 0001 07/14/17 0357 07/14/17 0720  GLUCAP 37* 179* 112* 106* 90 100*   Recent Labs  Lab 07-03-2017 1730 07-03-2017 2133  PHART 7.24* 7.28*  PCO2ART 37 38  PO2ART 156* 140*   Recent Labs  Lab 07-03-2017 0835 07-03-2017 2039 07/14/17 0347  AST 64* 114*  --   ALT 31 62*  --   ALKPHOS 54 63  --  BILITOT 1.7* 3.0* 1.7*  ALBUMIN 3.2* 2.9*  --     Cardiac Enzymes Recent Labs  Lab 07/08/2017 2039  TROPONINI 1.07*    RADIOLOGY:  Dg Abd 1 View  Result Date: 07/21/2017 CLINICAL DATA:  Check gastric catheter placement EXAM: ABDOMEN - 1 VIEW COMPARISON:  07/12/2017 FINDINGS: Gastric catheter is noted in the stomach. Left ureteral stent is seen in satisfactory position. Proximal left ureteral stone is again identified. Nonobstructive bowel gas pattern is noted. Right femoral central line is noted. IMPRESSION: Gastric catheter within the stomach. Other tubes and lines as described. Stable proximal left ureteral stone.  Electronically Signed   By: Alcide Clever M.D.   On: 07/02/2017 19:39   Ct Head Wo Contrast  Result Date: 07/22/2017 CLINICAL DATA:  Patient found unresponsive. EXAM: CT HEAD WITHOUT CONTRAST TECHNIQUE: Contiguous axial images were obtained from the base of the skull through the vertex without intravenous contrast. COMPARISON:  October 01, 2013 FINDINGS: Brain: No subdural, epidural, or subarachnoid hemorrhage. Cerebellum, brainstem, and basal cisterns are normal. White matter changes are stable. Ventricles and sulci are unremarkable. No mass effect or midline shift. No acute cortical ischemia or infarct. Vascular: No hyperdense vessel or unexpected calcification. Skull: Normal. Negative for fracture or focal lesion. Sinuses/Orbits: Mild mucosal thickening in the ethmoid sinuses. Paranasal sinuses, mastoid air cells, and middle ears are otherwise normal. Other: There is soft tissue swelling over the left side of the scalp which has increased since 2,015. IMPRESSION: 1. No acute intracranial abnormality identified. 2. Soft tissue thickening in the left scalp. Recommend clinical correlation. 3. Mild mucosal thickening in the ethmoid sinuses. Electronically Signed   By: Gerome Sam III M.D   On: 07/21/2017 10:40   Dg Chest Port 1 View  Result Date: 07/25/2017 CLINICAL DATA:  Check endotracheal tube placement EXAM: PORTABLE CHEST 1 VIEW COMPARISON:  07/24/2017 FINDINGS: Endotracheal tube and nasogastric catheter are now seen in satisfactory position. Cardiac shadow is enlarged but stable. Right hemidiaphragm is again elevated but stable. No focal infiltrate or sizable effusion is noted. Previously seen changes in the bases were likely related incomplete aeration. IMPRESSION: Tubes and lines as described above. Improved aeration in the bases bilaterally. Electronically Signed   By: Alcide Clever M.D.   On: 07/10/2017 19:38   Dg Chest Portable 1 View  Result Date: 07/02/2017 CLINICAL DATA:  Hypoxia EXAM:  PORTABLE CHEST 1 VIEW COMPARISON:  None. FINDINGS: Cardiomegaly. Elevation of the right hemidiaphragm. The left lower lobe airspace opacity, possible pneumonia. Suspect small left effusion. Right base atelectasis. IMPRESSION: Cardiomegaly. Left side effusion with left lower lobe atelectasis or pneumonia. Right base atelectasis. Electronically Signed   By: Charlett Nose M.D.   On: 07/08/2017 09:22   Ct Renal Stone Study  Result Date: 07/12/2017 CLINICAL DATA:  Sharp left flank pain EXAM: CT ABDOMEN AND PELVIS WITHOUT CONTRAST TECHNIQUE: Multidetector CT imaging of the abdomen and pelvis was performed following the standard protocol without IV contrast. COMPARISON:  None. FINDINGS: Lower chest: Cardiomegaly.  No acute findings. Hepatobiliary: No focal hepatic abnormality. Gallbladder unremarkable. Pancreas: No focal abnormality or ductal dilatation. Spleen: No focal abnormality.  Normal size. Adrenals/Urinary Tract: Mild to moderate left hydronephrosis and perinephric stranding due to 7 mm proximal left ureteral stone. No hydronephrosis on the right. Punctate nonobstructing stones in the lower pole of the left kidney. Adrenal glands and urinary bladder unremarkable.5.6 cm low-density lesion off the lower pole of the right kidney likely reflects a cyst although this cannot be characterized  without intravenous contrast. Stomach/Bowel: Sigmoid diverticulosis. No active diverticulitis. Appendix is normal. Stomach and small bowel decompressed, unremarkable. Vascular/Lymphatic: Diffuse aortic and iliac calcifications. No evidence of aneurysm or adenopathy. Reproductive: Uterus and adnexa unremarkable.  No mass. Other: No free fluid or free air. Musculoskeletal: No acute bony abnormality. IMPRESSION: 7 mm proximal left ureteral stone with mild to moderate left hydronephrosis and perinephric stranding. Left lower pole punctate nonobstructing nephrolithiasis. Aortoiliac atherosclerosis. Sigmoid diverticulosis. Cardiomegaly.  Electronically Signed   By: Charlett Nose M.D.   On: 07/12/2017 12:02   Tora Kindred, DO 07/14/2017

## 2017-07-14 NOTE — Progress Notes (Signed)
0800 Attempted to wean with Propofol off. Patient very upset-trying to pull at tubes even with mitts on.Does not follow command or make eye contact. Dr. Lonn Georgiaonforti in. Changed sedation to Precedex.0950 sedation off again to try to wean.

## 2017-07-14 NOTE — Progress Notes (Signed)
[place] at [facility]                                                                                                                                                                                  Patient Demographics   Yvette Thomas, is a 84 y.o. female, DOB - 03/21/1925, RWE:[id number]  Admit date - 07/30/2017   Admitting Physician Demetrios Loll, MD  Outpatient Primary MD for the patient is Venia Carbon, MD   LOS - 1  Subjective: Patient currently intubated after procedure    Review of Systems:   CONSTITUTIONAL: On the vent sedated  Vitals:   Vitals:   07/14/17 0900 07/14/17 1000 07/14/17 1100 07/14/17 1200  BP: (!) 115/50 (!) 95/52 (!) 145/63 (!) 127/94  Pulse: 66 64 72 78  Resp: 18 (!) 22 (!) 26   Temp:      TempSrc:      SpO2: 97% 95% 95% 92%  Weight:      Height:        Wt Readings from Last 3 Encounters:  07/27/2017 155 lb 10.3 oz (70.6 kg)  07/12/17 143 lb (64.9 kg)  05/21/17 143 lb (64.9 kg)     Intake/Output Summary (Last 24 hours) at 07/14/2017 1417 Last data filed at 07/14/2017 1246 Gross per 24 hour  Intake 4691.64 ml  Output 211 ml  Net 4480.64 ml    Physical Exam:   GENERAL: Critically ill-appearing HEAD, EYES, EARS, NOSE AND THROAT: Atraumatic, normocephalic. Extraocular muscles are intact. Pupils equal and reactive to light. Sclerae anicteric. No conjunctival injection. No oro-pharyngeal erythema.  NECK: Supple. There is no jugular venous distention. No bruits, no lymphadenopathy, no thyromegaly.  HEART: Regular rate and rhythm,. No murmurs, no rubs, no clicks.  LUNGS: Clear to auscultation bilaterally. No rales or rhonchi. No wheezes.  ABDOMEN: Soft, flat, nontender, nondistended. Has good bowel sounds. No hepatosplenomegaly appreciated.  EXTREMITIES: No evidence of any cyanosis, clubbing, or peripheral edema.  +2 pedal and radial pulses bilaterally.  NEUROLOGIC: Sedated SKIN: Moist and warm with no rashes  appreciated.  Psych: Not anxious, depressed LN: No inguinal LN enlargement    Antibiotics   Anti-infectives (From admission, onward)   Start     Dose/Rate Route Frequency Ordered Stop   07/14/17 0600  meropenem (MERREM) 500 mg in sodium chloride 0.9 % 50 mL IVPB     500 mg 100 mL/hr over 30 Minutes Intravenous Every 12 hours 07/14/17 0504     07/24/2017 2200  piperacillin-tazobactam (ZOSYN) IVPB 3.375 g  Status:  Discontinued     3.375 g 12.5 mL/hr over 240 Minutes Intravenous Every 12 hours 07/02/2017 1415 07/14/17 0504  07/12/2017 0900  piperacillin-tazobactam (ZOSYN) IVPB 3.375 g     3.375 g 100 mL/hr over 30 Minutes Intravenous  Once 07/17/2017 0853 07/31/2017 1000   07/21/2017 0900  vancomycin (VANCOCIN) IVPB 1000 mg/200 mL premix     1,000 mg 200 mL/hr over 60 Minutes Intravenous  Once 07/12/2017 0853 07/21/2017 1118      Medications   Scheduled Meds: . chlorhexidine gluconate (MEDLINE KIT)  15 mL Mouth Rinse BID  . docusate sodium  100 mg Oral BID  . Influenza vac split quadrivalent PF  0.5 mL Intramuscular Tomorrow-1000  . mouth rinse  15 mL Mouth Rinse 10 times per day  . pantoprazole (PROTONIX) IV  40 mg Intravenous QHS  . pneumococcal 23 valent vaccine  0.5 mL Intramuscular Tomorrow-1000  . sodium chloride flush  10-40 mL Intracatheter Q12H  . tamsulosin  0.4 mg Oral Daily   Continuous Infusions: . sodium chloride Stopped (07/27/2017 2129)  . sodium chloride 75 mL/hr at 07/14/17 0600  . dexmedetomidine (PRECEDEX) IV infusion 0.4 mcg/kg/hr (07/14/17 0944)  . meropenem (MERREM) IV Stopped (07/14/17 0552)  . norepinephrine (LEVOPHED) Adult infusion 6 mcg/min (07/14/17 0943)  . propofol (DIPRIVAN) infusion Stopped (07/14/17 0741)  . sodium chloride     PRN Meds:.acetaminophen **OR** acetaminophen, albuterol, HYDROcodone-acetaminophen, ketorolac, meclizine, morphine injection, ondansetron **OR** ondansetron (ZOFRAN) IV, senna-docusate, sodium chloride flush   Data Review:    Micro Results Recent Results (from the past 240 hour(s))  Urine Culture     Status: Abnormal   Collection Time: 07/12/17 10:29 AM  Result Value Ref Range Status   Specimen Description   Final    URINE, CATHETERIZED Performed at [facility], [address], [place], [place] [address]    Special Requests   Final    NONE Performed at [facility], [address] [place], [place] [address]    Culture >=100,000 COLONIES/mL ESCHERICHIA COLI (A)  Final   Report Status 07/14/2017 FINAL  Final   Organism ID, Bacteria ESCHERICHIA COLI (A)  Final      Susceptibility   Escherichia coli - MIC*    AMPICILLIN >=32 RESISTANT Resistant     CEFAZOLIN 16 SENSITIVE Sensitive     CEFTRIAXONE <=1 SENSITIVE Sensitive     CIPROFLOXACIN <=0.25 SENSITIVE Sensitive     GENTAMICIN <=1 SENSITIVE Sensitive     IMIPENEM <=0.25 SENSITIVE Sensitive     NITROFURANTOIN 32 SENSITIVE Sensitive     TRIMETH/SULFA <=20 SENSITIVE Sensitive     AMPICILLIN/SULBACTAM >=32 RESISTANT Resistant     PIP/TAZO 8 SENSITIVE Sensitive     Extended ESBL NEGATIVE Sensitive     * >=100,000 COLONIES/mL ESCHERICHIA COLI  Blood culture (routine x 2)     Status: None (Preliminary result)   Collection Time: 07/12/2017  8:35 AM  Result Value Ref Range Status   Specimen Description   Final    BLOOD LEFT WRIST Performed at [facility], [address], [place], [place] [address]    Special Requests   Final    BOTTLES DRAWN AEROBIC AND ANAEROBIC Blood Culture adequate volume Performed at [facility], [address] [place],  [address]    Culture  Setup Time   Final    GRAM NEGATIVE RODS IN BOTH AEROBIC AND ANAEROBIC BOTTLES CRITICAL RESULT CALLED TO, READ BACK BY AND VERIFIED WITH: MATT MCBANE AT 8338 ON 07/14/17 RWW    Culture GRAM NEGATIVE RODS  Final   Report Status PENDING  Incomplete  Blood culture (routine x  2)     Status: None (Preliminary result)   Collection Time: 07/10/2017   8:35 AM  Result Value Ref Range Status   Specimen Description   Final    LEFT ANTECUBITAL Performed at [facility], [address] [place], [place] [address]    Special Requests   Final    BOTTLES DRAWN AEROBIC AND ANAEROBIC Blood Culture results may not be optimal due to an excessive volume of blood received in culture bottles Performed at [facility], [address] [place], [place] [address]    Culture  Setup Time   Final    Organism ID to follow [place] TO, READ BACK BY AND VERIFIED WITH: MATT MCBANE AT 9563 ON 07/14/17 RWW IN BOTH AEROBIC AND ANAEROBIC BOTTLES    Culture GRAM NEGATIVE RODS  Final   Report Status PENDING  Incomplete  Blood Culture ID Panel (Reflexed)     Status: Abnormal   Collection Time: 07/29/2017  8:35 AM  Result Value Ref Range Status   Enterococcus species NOT DETECTED NOT DETECTED Final   Listeria monocytogenes NOT DETECTED NOT DETECTED Final   Staphylococcus species NOT DETECTED NOT DETECTED Final   Staphylococcus aureus NOT DETECTED NOT DETECTED Final   Streptococcus species NOT DETECTED NOT DETECTED Final   Streptococcus agalactiae NOT DETECTED NOT DETECTED Final   Streptococcus pneumoniae NOT DETECTED NOT DETECTED Final   Streptococcus pyogenes NOT DETECTED NOT DETECTED Final   Acinetobacter baumannii NOT DETECTED NOT DETECTED Final   Enterobacteriaceae species DETECTED (A) NOT DETECTED Final    Comment: Enterobacteriaceae represent a large family of gram-negative bacteria, not a single organism. CRITICAL RESULT CALLED TO, READ BACK BY AND VERIFIED WITH: MATT MCBANE AT 0435 ON 07/14/17 RWW    Enterobacter cloacae complex NOT DETECTED NOT DETECTED Final   Escherichia coli DETECTED (A) NOT DETECTED Final    Comment: CRITICAL RESULT CALLED TO, READ BACK BY AND VERIFIED WITH: MATT MCBANE AT 0435 ON 07/14/17 RWW    Klebsiella oxytoca NOT DETECTED NOT DETECTED Final   Klebsiella pneumoniae NOT  DETECTED NOT DETECTED Final   Proteus species NOT DETECTED NOT DETECTED Final   Serratia marcescens NOT DETECTED NOT DETECTED Final   Carbapenem resistance NOT DETECTED NOT DETECTED Final   Haemophilus influenzae NOT DETECTED NOT DETECTED Final   Neisseria meningitidis NOT DETECTED NOT DETECTED Final   Pseudomonas aeruginosa NOT DETECTED NOT DETECTED Final   Candida albicans NOT DETECTED NOT DETECTED Final   Candida glabrata NOT DETECTED NOT DETECTED Final   Candida krusei NOT DETECTED NOT DETECTED Final   Candida parapsilosis NOT DETECTED NOT DETECTED Final   Candida tropicalis NOT DETECTED NOT DETECTED Final    Comment: Performed at [facility], [address] [place], [place] [address]  MRSA PCR Screening     Status: None   Collection Time: 07/21/2017  6:27 PM  Result Value Ref Range Status   MRSA by PCR NEGATIVE NEGATIVE Final    Comment:        The GeneXpert MRSA Assay (FDA approved for NASAL specimens only), is one component of a comprehensive MRSA colonization surveillance program. It is not intended to diagnose MRSA infection nor to guide or monitor treatment for MRSA infections. Performed at [facility], [address] [place], [place] [address]     Radiology Reports Dg Abd 1 View  Result Date: 07/26/2017 CLINICAL DATA:  Check gastric catheter placement EXAM: ABDOMEN - 1 VIEW COMPARISON:  07/12/2017 FINDINGS:  Gastric catheter is noted in the stomach. Left ureteral stent is seen in satisfactory position. Proximal left ureteral stone is again identified. Nonobstructive bowel gas pattern is noted. Right femoral central line is noted. IMPRESSION: Gastric catheter within the stomach. Other tubes and lines as described. Stable proximal left ureteral stone. Electronically Signed   By: Inez Catalina M.D.   On: 07/18/2017 19:39   Ct Head Wo Contrast  Result Date: 07/24/2017 CLINICAL DATA:  Patient found unresponsive. EXAM: CT HEAD WITHOUT CONTRAST TECHNIQUE:  Contiguous axial images were obtained from the base of the skull through the vertex without intravenous contrast. COMPARISON:  October 01, 2013 FINDINGS: Brain: No subdural, epidural, or subarachnoid hemorrhage. Cerebellum, brainstem, and basal cisterns are normal. White matter changes are stable. Ventricles and sulci are unremarkable. No mass effect or midline shift. No acute cortical ischemia or infarct. Vascular: No hyperdense vessel or unexpected calcification. Skull: Normal. Negative for fracture or focal lesion. Sinuses/Orbits: Mild mucosal thickening in the ethmoid sinuses. Paranasal sinuses, mastoid air cells, and middle ears are otherwise normal. Other: There is soft tissue swelling over the left side of the scalp which has increased since 2,015. IMPRESSION: 1. No acute intracranial abnormality identified. 2. Soft tissue thickening in the left scalp. Recommend clinical correlation. 3. Mild mucosal thickening in the ethmoid sinuses. Electronically Signed   By: Dorise Bullion III M.D   On: 07/10/2017 10:40   Dg Chest Port 1 View  Result Date: 07/27/2017 CLINICAL DATA:  Check endotracheal tube placement EXAM: PORTABLE CHEST 1 VIEW COMPARISON:  07/17/2017 FINDINGS: Endotracheal tube and nasogastric catheter are now seen in satisfactory position. Cardiac shadow is enlarged but stable. Right hemidiaphragm is again elevated but stable. No focal infiltrate or sizable effusion is noted. Previously seen changes in the bases were likely related incomplete aeration. IMPRESSION: Tubes and lines as described above. Improved aeration in the bases bilaterally. Electronically Signed   By: Inez Catalina M.D.   On: 07/18/2017 19:38   Dg Chest Portable 1 View  Result Date: 07/23/2017 CLINICAL DATA:  Hypoxia EXAM: PORTABLE CHEST 1 VIEW COMPARISON:  None. FINDINGS: Cardiomegaly. Elevation of the right hemidiaphragm. The left lower lobe airspace opacity, possible pneumonia. Suspect small left effusion. Right base  atelectasis. IMPRESSION: Cardiomegaly. Left side effusion with left lower lobe atelectasis or pneumonia. Right base atelectasis. Electronically Signed   By: Rolm Baptise M.D.   On: 07/02/2017 09:22   Ct Renal Stone Study  Result Date: 07/12/2017 CLINICAL DATA:  Sharp left flank pain EXAM: CT ABDOMEN AND PELVIS WITHOUT CONTRAST TECHNIQUE: Multidetector CT imaging of the abdomen and pelvis was performed following the standard protocol without IV contrast. COMPARISON:  None. FINDINGS: Lower chest: Cardiomegaly.  No acute findings. Hepatobiliary: No focal hepatic abnormality. Gallbladder unremarkable. Pancreas: No focal abnormality or ductal dilatation. Spleen: No focal abnormality.  Normal size. Adrenals/Urinary Tract: Mild to moderate left hydronephrosis and perinephric stranding due to 7 mm proximal left ureteral stone. No hydronephrosis on the right. Punctate nonobstructing stones in the lower pole of the left kidney. Adrenal glands and urinary bladder unremarkable.5.6 cm low-density lesion off the lower pole of the right kidney likely reflects a cyst although this cannot be characterized without intravenous contrast. Stomach/Bowel: Sigmoid diverticulosis. No active diverticulitis. Appendix is normal. Stomach and small bowel decompressed, unremarkable. Vascular/Lymphatic: Diffuse aortic and iliac calcifications. No evidence of aneurysm or adenopathy. Reproductive: Uterus and adnexa unremarkable.  No mass. Other: No free fluid or free air. Musculoskeletal: No acute bony abnormality. IMPRESSION: 7 mm proximal  left ureteral stone with mild to moderate left hydronephrosis and perinephric stranding. Left lower pole punctate nonobstructing nephrolithiasis. Aortoiliac atherosclerosis. Sigmoid diverticulosis. Cardiomegaly. Electronically Signed   By: Rolm Baptise M.D.   On: 07/12/2017 12:02     CBC Recent Labs  Lab 07/12/17 1030 07/15/2017 0835 07/12/2017 2039 07/14/17 0347  WBC 11.9* 9.7 19.1* 22.0*  HGB 13.2  11.2* 11.4* 10.9*  HCT 39.3 34.2* 34.8* 33.6*  PLT 139* 72* 56* 54*  MCV 91.2 92.5 92.6 92.3  MCH 30.7 30.4 30.3 30.0  MCHC 33.6 32.8 32.7 32.5  RDW 13.8 14.0 14.2 14.4  LYMPHSABS  --  0.2*  --   --   MONOABS  --  0.4  --   --   EOSABS  --  0.1  --   --   BASOSABS  --  0.0  --   --     Chemistries  Recent Labs  Lab 07/12/17 1030 07/07/2017 0835 07/29/2017 1247 07/28/2017 2039 07/14/17 0347  NA 140 136  --  135 138  K 4.1 3.9  --  5.0 4.9  CL 106 106  --  107 111  CO2 25 22  --  20* 19*  GLUCOSE 155* 90  --  160* 102*  BUN 17 34*  --  41* 45*  CREATININE 0.99 2.19* 2.38* 2.32* 2.49*  CALCIUM 9.3 8.3*  --  7.6* 7.2*  MG  --   --   --  1.6*  --   AST  --  64*  --  114*  --   ALT  --  31  --  62*  --   ALKPHOS  --  54  --  63  --   BILITOT  --  1.7*  --  3.0* 1.7*   ------------------------------------------------------------------------------------------------------------------ estimated creatinine clearance is 13.8 mL/min (A) (by C-G formula based on SCr of 2.49 mg/dL (H)). ------------------------------------------------------------------------------------------------------------------ No results for input(s): HGBA1C in the last 72 hours. ------------------------------------------------------------------------------------------------------------------ Recent Labs    07/16/2017 1827  TRIG 107   ------------------------------------------------------------------------------------------------------------------ No results for input(s): TSH, T4TOTAL, T3FREE, THYROIDAB in the last 72 hours.  Invalid input(s): FREET3 ------------------------------------------------------------------------------------------------------------------ No results for input(s): VITAMINB12, FOLATE, FERRITIN, TIBC, IRON, RETICCTPCT in the last 72 hours.  Coagulation profile Recent Labs  Lab 07/08/2017 1247 07/27/2017 2039  INR 1.62 1.74    No results for input(s): DDIMER in the last 72 hours.  Cardiac  Enzymes Recent Labs  Lab 07/03/2017 2039  TROPONINI 1.07*   ------------------------------------------------------------------------------------------------------------------ Invalid input(s): POCBNP    Assessment & Plan   #1 acute renal failure due to nephrolithiasis with left hydronephrosis Patient underwent left retrograde pyelography with placement  #2 acute respiratory failure postop Extubate the patient for intensive  #3 left side effusion with left lower lobe atelectasis or Pneumonia.  Continue antibiotics.  #4 sepsis due to UTI Pending urine cultures, continue sepsis protocol with vancomycin and Zosyn, follow-up blood culture.   #6 acute metabolic encephalopathy due to above. Aspiration the fall precaution.  #7 hypotension.  Hold hypertension medication, continue IV fluid support.  #8 thrombocytopenia.  Unclear etiology, possible due to sepsis. Follow-up CBC.  Hold heparin subcu for DVT prophylaxis, give SCD.  #9 dementia  #10 patient DNR if no significant improvement will consider palliative care consult      Code Status Orders  (From admission, onward)        Start     Ordered   07/06/2017 1129  Do not attempt resuscitation (DNR)  Continuous  Question Answer Comment  In the event of cardiac or respiratory ARREST Do not call a "code blue"   In the event of cardiac or respiratory ARREST Do not perform Intubation, CPR, defibrillation or ACLS   In the event of cardiac or respiratory ARREST Use medication by any route, position, wound care, and other measures to relive pain and suffering. May use oxygen, suction and manual treatment of airway obstruction as needed for comfort.      07/14/2017 1128    Code Status History    Date Active Date Inactive Code Status Order ID Comments User Context   This patient has a current code status but no historical code status.    Advance Directive Documentation     Most Recent Value  Type of Advance Directive   Healthcare Power of Attorney, Living will  Pre-existing out of facility DNR order (yellow form or pink MOST form)  No data  "MOST" Form in Place?  No data           Consults  pulm ccr  DVT Prophylaxis SCD Lab Results  Component Value Date   PLT 54 (L) 07/14/2017     Time Spent in minutes   35 minutes  Greater than 50% of time spent in care coordination and counseling patient regarding the condition and plan of care.   Dustin Flock M.D on 07/14/2017 at 2:17 PM  Between 7am to 6pm - Pager - [phone]  After 6pm go to [email or web address] - password [email or web address] [facility]   Office  [phone]

## 2017-07-14 NOTE — Progress Notes (Signed)
Interesting day. Extubated easily at 1045 this morning. Confused and crying out. Medicated for pain x 2. Remained confused. Restarted on Precedex at .2 mcg for rest. Awakens to stimulation but resumes sleep quickly. Precedex turned off at 1600. Awakened clear headed and talked with family coherently. Resting on 3 L nasal cannula oxygen.

## 2017-07-14 NOTE — Plan of Care (Signed)
Pt. Intubated & sedated O/N.  Intermittently agitated on propofol gtt, unable to follow commands during shift.  Levo gtt running to support BP (see MAR for details). UOP remained low O/N- on average 10 mL/h, pt. Received total of 2 L NS in addition to maintenance fluids. Report given to Myra.

## 2017-07-14 NOTE — Anesthesia Postprocedure Evaluation (Signed)
Anesthesia Post Note  Patient: Yvette Thomas  Procedure(s) Performed: CYSTOSCOPY WITH RETROGRADE PYELOGRAM/URETERAL STENT PLACEMENT (Left )  Patient location during evaluation: ICU Anesthesia Type: General Level of consciousness: awake Pain management: pain level controlled Vital Signs Assessment: post-procedure vital signs reviewed and stable Respiratory status: spontaneous breathing and patient on ventilator - see flowsheet for VS Cardiovascular status: stable Anesthetic complications: no Comments: Pt still on vent and plan is to wean from vent and extubate per ICU RN.  Pt still on Norepi drip and plan is to wean that as well.       Last Vitals:  Vitals:   07/14/17 0630 07/14/17 0700  BP: (!) 107/58 (!) 104/52  Pulse: 69 69  Resp: 14 14  Temp:  36.7 C  SpO2: 98% 98%    Last Pain:  Vitals:   07/14/17 0400  TempSrc: Axillary  PainSc: 2                  Yvette Thomas,  Ricquel Foulk R

## 2017-07-15 ENCOUNTER — Encounter: Payer: Self-pay | Admitting: Urology

## 2017-07-15 DIAGNOSIS — N12 Tubulo-interstitial nephritis, not specified as acute or chronic: Secondary | ICD-10-CM

## 2017-07-15 DIAGNOSIS — R7881 Bacteremia: Secondary | ICD-10-CM

## 2017-07-15 DIAGNOSIS — R6520 Severe sepsis without septic shock: Secondary | ICD-10-CM

## 2017-07-15 LAB — GLUCOSE, CAPILLARY
GLUCOSE-CAPILLARY: 81 mg/dL (ref 65–99)
GLUCOSE-CAPILLARY: 83 mg/dL (ref 65–99)
GLUCOSE-CAPILLARY: 85 mg/dL (ref 65–99)
Glucose-Capillary: 73 mg/dL (ref 65–99)
Glucose-Capillary: 74 mg/dL (ref 65–99)
Glucose-Capillary: 74 mg/dL (ref 65–99)
Glucose-Capillary: 96 mg/dL (ref 65–99)

## 2017-07-15 LAB — BASIC METABOLIC PANEL
Anion gap: 14 (ref 5–15)
BUN: 68 mg/dL — ABNORMAL HIGH (ref 6–20)
CALCIUM: 8.2 mg/dL — AB (ref 8.9–10.3)
CHLORIDE: 109 mmol/L (ref 101–111)
CO2: 15 mmol/L — ABNORMAL LOW (ref 22–32)
CREATININE: 2.88 mg/dL — AB (ref 0.44–1.00)
GFR, EST AFRICAN AMERICAN: 15 mL/min — AB (ref >60)
GFR, EST NON AFRICAN AMERICAN: 13 mL/min — AB (ref >60)
Glucose, Bld: 76 mg/dL (ref 65–99)
Potassium: 4.9 mmol/L (ref 3.5–5.1)
SODIUM: 138 mmol/L (ref 135–145)

## 2017-07-15 LAB — URINE CULTURE: Culture: >=100000 — AB

## 2017-07-15 LAB — CBC
HCT: 37.8 % (ref 35.0–47.0)
HEMOGLOBIN: 12.1 g/dL (ref 12.0–16.0)
MCH: 29.9 pg (ref 26.0–34.0)
MCHC: 31.9 g/dL — AB (ref 32.0–36.0)
MCV: 93.6 fL (ref 80.0–100.0)
Platelets: 52 10*3/uL — ABNORMAL LOW (ref 150–440)
RBC: 4.04 MIL/uL (ref 3.80–5.20)
RDW: 14.9 % — AB (ref 11.5–14.5)
WBC: 28.1 10*3/uL — ABNORMAL HIGH (ref 3.6–11.0)

## 2017-07-15 LAB — PHOSPHORUS: PHOSPHORUS: 5.4 mg/dL — AB (ref 2.5–4.6)

## 2017-07-15 LAB — MAGNESIUM: Magnesium: 1.8 mg/dL (ref 1.7–2.4)

## 2017-07-15 MED ORDER — DEXTROSE 5 % IV SOLN
2.0000 g | INTRAVENOUS | Status: DC
  Administered 2017-07-15 – 2017-07-16 (×2): 2 g via INTRAVENOUS
  Filled 2017-07-15 (×3): qty 2

## 2017-07-15 MED ORDER — DEXTROSE 5 % IV SOLN: 1.0000 g | INTRAVENOUS | Status: DC

## 2017-07-15 MED ORDER — LORAZEPAM 2 MG/ML IJ SOLN
0.5000 mg | Freq: Once | INTRAMUSCULAR | Status: AC
  Administered 2017-07-15: 0.5 mg via INTRAVENOUS
  Filled 2017-07-15: qty 1

## 2017-07-15 MED ORDER — ORAL CARE MOUTH RINSE
15.0000 mL | Freq: Two times a day (BID) | OROMUCOSAL | Status: DC
  Administered 2017-07-15 – 2017-07-20 (×10): 15 mL via OROMUCOSAL

## 2017-07-15 MED ORDER — HYDRALAZINE HCL 20 MG/ML IJ SOLN
10.0000 mg | INTRAMUSCULAR | Status: DC | PRN
  Administered 2017-07-17 – 2017-07-18 (×2): 20 mg via INTRAVENOUS
  Filled 2017-07-15 (×2): qty 1

## 2017-07-15 NOTE — Progress Notes (Signed)
Intermittently agitated somnolent on Precedex No respiratory distress Not following commands  Vitals:   07/15/17 1000 07/15/17 1100 07/15/17 1216 07/15/17 1300  BP: (!) 124/93 (!) 105/59  (!) 149/59  Pulse: (!) 52 (!) 52 (!) 51 (!) 49  Resp: 17 19 (!) 23 16  Temp:   (!) 97.5 F (36.4 C)   TempSrc:   Axillary   SpO2: 99% 95% 98% 99%  Weight:      Height:         Gen: Agitated, no overt respiratory distress HEENT: NCAT, sclerae white Neck: No LAN, no JVD noted Lungs: Bilateral crackles Cardiovascular: Huston FoleyBrady, reg, no M Abdomen: Soft, NT, +BS Ext: no C/C/E Neuro: PERRL, EOMI, motor/sensory grossly intact  BMP Latest Ref Rng & Units 07/15/2017 07/14/2017 07/27/2017  Glucose 65 - 99 mg/dL 76 161(W102(H) 960(A160(H)  BUN 6 - 20 mg/dL 54(U68(H) 98(J45(H) 19(J41(H)  Creatinine 0.44 - 1.00 mg/dL 4.78(G2.88(H) 9.56(O2.49(H) 1.30(Q2.32(H)  Sodium 135 - 145 mmol/L 138 138 135  Potassium 3.5 - 5.1 mmol/L 4.9 4.9 5.0  Chloride 101 - 111 mmol/L 109 111 107  CO2 22 - 32 mmol/L 15(L) 19(L) 20(L)  Calcium 8.9 - 10.3 mg/dL 8.2(L) 7.2(L) 7.6(L)    CBC Latest Ref Rng & Units 07/15/2017 07/14/2017 07/17/2017  WBC 3.6 - 11.0 K/uL 28.1(H) 22.0(H) 19.1(H)  Hemoglobin 12.0 - 16.0 g/dL 65.712.1 10.9(L) 11.4(L)  Hematocrit 35.0 - 47.0 % 37.8 33.6(L) 34.8(L)  Platelets 150 - 440 K/uL 52(L) 54(L) 56(L)    CXR: NNF  IMPRESSION: Severe sepsis Septic shock, resolved Pyelonephritis due to ureteral stone and hydro  S/P ureteral stent E coli bacteremia - sensitive to all but amp AKI, oliguric - not a candidate for HD Acute delirium - likely multifactorial DNR Bradycardia - likely due to dex but has documented hx of hypothyroidism  PLAN: DC IVFs Change meropenem to ceftriaxone Continue dexmedetomidine Check TSH in AM 01/15 Family updated @ bedside   Billy Fischeravid Lucynda Rosano, MD PCCM service Mobile [phone] Pager [phone] 07/15/2017 2:27 PM

## 2017-07-15 NOTE — Progress Notes (Signed)
[employer] - [place] at [facility]                                                                                                                                                                                  Patient Demographics   Yvette Thomas, is a 84 y.o. female, DOB - 1925-03-03, ZOX:[id number]  Admit date - 07/12/2017   Admitting Physician Shaune Pollack, MD  Outpatient Primary MD for the patient is Karie Schwalbe, MD   LOS - 2  Subjective: Patient this morning is drowsy and on full facemask Not responding   Review of Systems:   CONSTITUTIONAL: Drowsy  Vitals:   Vitals:   07/15/17 1400 07/15/17 1500 07/15/17 1600 07/15/17 1700  BP: 132/62 (!) 122/59 (!) 156/71 (!) 152/64  Pulse: (!) 51  (!) 47 (!) 50  Resp: 15 17 16 16   Temp:  (!) 96 F (35.6 C) (!) 96.7 F (35.9 C)   TempSrc:  Axillary Axillary   SpO2: 99%  98% 98%  Weight:      Height:        Wt Readings from Last 3 Encounters:  07/18/2017 155 lb 10.3 oz (70.6 kg)  07/12/17 143 lb (64.9 kg)  05/21/17 143 lb (64.9 kg)     Intake/Output Summary (Last 24 hours) at 07/15/2017 1750 Last data filed at 07/15/2017 1700 Gross per 24 hour  Intake 2133.28 ml  Output 575 ml  Net 1558.28 ml    Physical Exam:   GENERAL: Critically ill-appearing HEAD, EYES, EARS, NOSE AND THROAT: Atraumatic, normocephalic. Extraocular muscles are intact. Pupils equal and reactive to light. Sclerae anicteric. No conjunctival injection. No oro-pharyngeal erythema.  NECK: Supple. There is no jugular venous distention. No bruits, no lymphadenopathy, no thyromegaly.  HEART: Regular rate and rhythm,. No murmurs, no rubs, no clicks.  LUNGS: Clear to auscultation bilaterally. No rales or rhonchi. No wheezes.  ABDOMEN: Soft, flat, nontender, nondistended. Has good bowel sounds. No hepatosplenomegaly appreciated.  EXTREMITIES: No evidence of any cyanosis, clubbing, or peripheral edema.  +2 pedal and radial pulses bilaterally.   NEUROLOGIC: Drowsy SKIN: Moist and warm with no rashes appreciated.  Psych: Not anxious, depressed LN: No inguinal LN enlargement    Antibiotics   Anti-infectives (From admission, onward)   Start     Dose/Rate Route Frequency Ordered Stop   07/15/17 1200  cefTRIAXone (ROCEPHIN) 2 g in dextrose 5 % 50 mL IVPB     2 g 100 mL/hr over 30 Minutes Intravenous Every 24 hours 07/15/17 1048     07/15/17 1100  cefTRIAXone (ROCEPHIN) 1 g in dextrose 5 % 50 mL IVPB  Status:  Discontinued  1 g 100 mL/hr over 30 Minutes Intravenous Every 24 hours 07/15/17 1047 07/15/17 1048   07/14/17 0600  meropenem (MERREM) 500 mg in sodium chloride 0.9 % 50 mL IVPB  Status:  Discontinued     500 mg 100 mL/hr over 30 Minutes Intravenous Every 12 hours 07/14/17 0504 07/15/17 0854   07/31/2017 2200  piperacillin-tazobactam (ZOSYN) IVPB 3.375 g  Status:  Discontinued     3.375 g 12.5 mL/hr over 240 Minutes Intravenous Every 12 hours 07/11/2017 1415 07/14/17 0504   07/22/2017 0900  piperacillin-tazobactam (ZOSYN) IVPB 3.375 g     3.375 g 100 mL/hr over 30 Minutes Intravenous  Once 07/15/2017 0853 07/08/2017 1000   07/06/2017 0900  vancomycin (VANCOCIN) IVPB 1000 mg/200 mL premix     1,000 mg 200 mL/hr over 60 Minutes Intravenous  Once 07/19/2017 0853 07/19/2017 1118      Medications   Scheduled Meds: . docusate sodium  100 mg Oral BID  . Influenza vac split quadrivalent PF  0.5 mL Intramuscular Tomorrow-1000  . mouth rinse  15 mL Mouth Rinse BID  . pneumococcal 23 valent vaccine  0.5 mL Intramuscular Tomorrow-1000  . sodium chloride flush  10-40 mL Intracatheter Q12H   Continuous Infusions: . cefTRIAXone (ROCEPHIN)  IV Stopped (07/15/17 1241)  . dexmedetomidine (PRECEDEX) IV infusion 0.8 mcg/kg/hr (07/15/17 1654)   PRN Meds:.acetaminophen **OR** acetaminophen, albuterol, hydrALAZINE, morphine injection, [DISCONTINUED] ondansetron **OR** ondansetron (ZOFRAN) IV, senna-docusate, sodium chloride flush   Data Review:    Micro Results Recent Results (from the past 240 hour(s))  Urine Culture     Status: Abnormal   Collection Time: 07/12/17 10:29 AM  Result Value Ref Range Status   Specimen Description   Final    URINE, CATHETERIZED Performed at [facility], [address] [place], [place] [address]    Special Requests   Final    NONE Performed at [facility], [address] [place], [place] [address]    Culture >=100,000 COLONIES/mL ESCHERICHIA COLI (A)  Final   Report Status 07/14/2017 FINAL  Final   Organism ID, Bacteria ESCHERICHIA COLI (A)  Final      Susceptibility   Escherichia coli - MIC*    AMPICILLIN >=32 RESISTANT Resistant     CEFAZOLIN 16 SENSITIVE Sensitive     CEFTRIAXONE <=1 SENSITIVE Sensitive     CIPROFLOXACIN <=0.25 SENSITIVE Sensitive     GENTAMICIN <=1 SENSITIVE Sensitive     IMIPENEM <=0.25 SENSITIVE Sensitive     NITROFURANTOIN 32 SENSITIVE Sensitive     TRIMETH/SULFA <=20 SENSITIVE Sensitive     AMPICILLIN/SULBACTAM >=32 RESISTANT Resistant     PIP/TAZO 8 SENSITIVE Sensitive     Extended ESBL NEGATIVE Sensitive     * >=100,000 COLONIES/mL ESCHERICHIA COLI  Blood culture (routine x 2)     Status: Abnormal (Preliminary result)   Collection Time: 07/12/2017  8:35 AM  Result Value Ref Range Status   Specimen Description   Final    BLOOD LEFT WRIST Performed at [facility], [address] [place], [place] [address]    Special Requests   Final    BOTTLES DRAWN AEROBIC AND ANAEROBIC Blood Culture adequate volume Performed at [facility], [address] [place], [place] [address]    Culture  Setup Time   Final    GRAM NEGATIVE RODS IN BOTH AEROBIC AND ANAEROBIC BOTTLES CRITICAL RESULT CALLED TO, READ BACK BY AND VERIFIED WITH: MATT MCBANE AT 0435 ON 07/14/17 RWW    Culture  ESCHERICHIA COLI (A)  Final   Report Status PENDING  Incomplete  Blood culture (routine x 2)     Status: Abnormal (Preliminary result)   Collection  Time: 07/30/2017  8:35 AM  Result Value Ref Range Status   Specimen Description   Final    LEFT ANTECUBITAL Performed at [facility], [address] [place], [place] [address]    Special Requests   Final    BOTTLES DRAWN AEROBIC AND ANAEROBIC Blood Culture results may not be optimal due to an excessive volume of blood received in culture bottles Performed at [facility], [address] [place], [place] [address]    Culture  Setup Time   Final    GRAM NEGATIVE RODS CRITICAL RESULT CALLED TO, READ BACK BY AND VERIFIED WITH: MATT MCBANE AT 0435 ON 07/14/17 RWW IN BOTH AEROBIC AND ANAEROBIC BOTTLES    Culture ESCHERICHIA COLI (A)  Final   Report Status PENDING  Incomplete  Blood Culture ID Panel (Reflexed)     Status: Abnormal   Collection Time: 07/19/2017  8:35 AM  Result Value Ref Range Status   Enterococcus species NOT DETECTED NOT DETECTED Final   Listeria monocytogenes NOT DETECTED NOT DETECTED Final   Staphylococcus species NOT DETECTED NOT DETECTED Final   Staphylococcus aureus NOT DETECTED NOT DETECTED Final   Streptococcus species NOT DETECTED NOT DETECTED Final   Streptococcus agalactiae NOT DETECTED NOT DETECTED Final   Streptococcus pneumoniae NOT DETECTED NOT DETECTED Final   Streptococcus pyogenes NOT DETECTED NOT DETECTED Final   Acinetobacter baumannii NOT DETECTED NOT DETECTED Final   Enterobacteriaceae species DETECTED (A) NOT DETECTED Final    Comment: Enterobacteriaceae represent a large family of gram-negative bacteria, not a single organism. CRITICAL RESULT CALLED TO, READ BACK BY AND VERIFIED WITH: MATT MCBANE AT 0435 ON 07/14/17 RWW    Enterobacter cloacae complex NOT DETECTED NOT DETECTED Final   Escherichia coli DETECTED (A) NOT DETECTED Final    Comment: CRITICAL RESULT CALLED TO, READ BACK BY AND VERIFIED WITH: MATT MCBANE AT 0435 ON 07/14/17 RWW    Klebsiella oxytoca NOT DETECTED NOT DETECTED Final   Klebsiella pneumoniae NOT DETECTED  NOT DETECTED Final   Proteus species NOT DETECTED NOT DETECTED Final   Serratia marcescens NOT DETECTED NOT DETECTED Final   Carbapenem resistance NOT DETECTED NOT DETECTED Final   Haemophilus influenzae NOT DETECTED NOT DETECTED Final   Neisseria meningitidis NOT DETECTED NOT DETECTED Final   Pseudomonas aeruginosa NOT DETECTED NOT DETECTED Final   Candida albicans NOT DETECTED NOT DETECTED Final   Candida glabrata NOT DETECTED NOT DETECTED Final   Candida krusei NOT DETECTED NOT DETECTED Final   Candida parapsilosis NOT DETECTED NOT DETECTED Final   Candida tropicalis NOT DETECTED NOT DETECTED Final    Comment: Performed at [facility], [address], [place], [place] [address]  Urine culture     Status: Abnormal   Collection Time: 07/14/2017 10:31 AM  Result Value Ref Range Status   Specimen Description   Final    URINE, CATHETERIZED Performed at [facility], [address], [place], [place] [address]    Special Requests   Final    NONE Performed at [facility], [address] [place], [place] [address]    Culture >=100,000 COLONIES/mL ESCHERICHIA COLI (A)  Final   Report Status 07/15/2017 FINAL  Final   Organism ID, Bacteria ESCHERICHIA COLI (A)  Final      Susceptibility   Escherichia coli -  MIC*    AMPICILLIN >=32 RESISTANT Resistant     CEFAZOLIN 8 SENSITIVE Sensitive     CEFTRIAXONE <=1 SENSITIVE Sensitive     CIPROFLOXACIN <=0.25 SENSITIVE Sensitive     GENTAMICIN <=1 SENSITIVE Sensitive     IMIPENEM <=0.25 SENSITIVE Sensitive     NITROFURANTOIN <=16 SENSITIVE Sensitive     TRIMETH/SULFA <=20 SENSITIVE Sensitive     AMPICILLIN/SULBACTAM >=32 RESISTANT Resistant     PIP/TAZO <=4 SENSITIVE Sensitive     Extended ESBL NEGATIVE Sensitive     * >=100,000 COLONIES/mL ESCHERICHIA COLI  MRSA PCR Screening     Status: None   Collection Time: 2017-08-02  6:27 PM  Result Value Ref Range Status   MRSA by PCR NEGATIVE NEGATIVE Final    Comment:         The GeneXpert MRSA Assay (FDA approved for NASAL specimens only), is one component of a comprehensive MRSA colonization surveillance program. It is not intended to diagnose MRSA infection nor to guide or monitor treatment for MRSA infections. Performed at [facility], [address], [place], [place] [address]     Radiology Reports Dg Abd 1 View  Result Date: 08/02/2017 CLINICAL DATA:  Check gastric catheter placement EXAM: ABDOMEN - 1 VIEW COMPARISON:  07/12/2017 FINDINGS: Gastric catheter is noted in the stomach. Left ureteral stent is seen in satisfactory position. Proximal left ureteral stone is again identified. Nonobstructive bowel gas pattern is noted. Right femoral central line is noted. IMPRESSION: Gastric catheter within the stomach. Other tubes and lines as described. Stable proximal left ureteral stone. Electronically Signed   By: Alcide Clever M.D.   On: 2017/08/02 19:39   Ct Head Wo Contrast  Result Date: 08/02/17 CLINICAL DATA:  Patient found unresponsive. EXAM: CT HEAD WITHOUT CONTRAST TECHNIQUE: Contiguous axial images were obtained from the base of the skull through the vertex without intravenous contrast. COMPARISON:  October 01, 2013 FINDINGS: Brain: No subdural, epidural, or subarachnoid hemorrhage. Cerebellum, brainstem, and basal cisterns are normal. White matter changes are stable. Ventricles and sulci are unremarkable. No mass effect or midline shift. No acute cortical ischemia or infarct. Vascular: No hyperdense vessel or unexpected calcification. Skull: Normal. Negative for fracture or focal lesion. Sinuses/Orbits: Mild mucosal thickening in the ethmoid sinuses. Paranasal sinuses, mastoid air cells, and middle ears are otherwise normal. Other: There is soft tissue swelling over the left side of the scalp which has increased since 2,015. IMPRESSION: 1. No acute intracranial abnormality identified. 2. Soft tissue thickening in the left scalp. Recommend  clinical correlation. 3. Mild mucosal thickening in the ethmoid sinuses. Electronically Signed   By: Gerome Sam III M.D   On: Aug 02, 2017 10:40   Dg Chest Port 1 View  Result Date: 08-02-17 CLINICAL DATA:  Check endotracheal tube placement EXAM: PORTABLE CHEST 1 VIEW COMPARISON:  Aug 02, 2017 FINDINGS: Endotracheal tube and nasogastric catheter are now seen in satisfactory position. Cardiac shadow is enlarged but stable. Right hemidiaphragm is again elevated but stable. No focal infiltrate or sizable effusion is noted. Previously seen changes in the bases were likely related incomplete aeration. IMPRESSION: Tubes and lines as described above. Improved aeration in the bases bilaterally. Electronically Signed   By: Alcide Clever M.D.   On: 08/02/17 19:38   Dg Chest Portable 1 View  Result Date: 2017/08/02 CLINICAL DATA:  Hypoxia EXAM: PORTABLE CHEST 1 VIEW COMPARISON:  None. FINDINGS: Cardiomegaly. Elevation of the right hemidiaphragm. The left lower lobe airspace opacity, possible pneumonia. Suspect small left effusion. Right  base atelectasis. IMPRESSION: Cardiomegaly. Left side effusion with left lower lobe atelectasis or pneumonia. Right base atelectasis. Electronically Signed   By: Charlett Nose M.D.   On: 2017/07/17 09:22   Ct Renal Stone Study  Result Date: 07/12/2017 CLINICAL DATA:  Sharp left flank pain EXAM: CT ABDOMEN AND PELVIS WITHOUT CONTRAST TECHNIQUE: Multidetector CT imaging of the abdomen and pelvis was performed following the standard protocol without IV contrast. COMPARISON:  None. FINDINGS: Lower chest: Cardiomegaly.  No acute findings. Hepatobiliary: No focal hepatic abnormality. Gallbladder unremarkable. Pancreas: No focal abnormality or ductal dilatation. Spleen: No focal abnormality.  Normal size. Adrenals/Urinary Tract: Mild to moderate left hydronephrosis and perinephric stranding due to 7 mm proximal left ureteral stone. No hydronephrosis on the right. Punctate nonobstructing  stones in the lower pole of the left kidney. Adrenal glands and urinary bladder unremarkable.5.6 cm low-density lesion off the lower pole of the right kidney likely reflects a cyst although this cannot be characterized without intravenous contrast. Stomach/Bowel: Sigmoid diverticulosis. No active diverticulitis. Appendix is normal. Stomach and small bowel decompressed, unremarkable. Vascular/Lymphatic: Diffuse aortic and iliac calcifications. No evidence of aneurysm or adenopathy. Reproductive: Uterus and adnexa unremarkable.  No mass. Other: No free fluid or free air. Musculoskeletal: No acute bony abnormality. IMPRESSION: 7 mm proximal left ureteral stone with mild to moderate left hydronephrosis and perinephric stranding. Left lower pole punctate nonobstructing nephrolithiasis. Aortoiliac atherosclerosis. Sigmoid diverticulosis. Cardiomegaly. Electronically Signed   By: Charlett Nose M.D.   On: 07/12/2017 12:02     CBC Recent Labs  Lab 07/12/17 1030 07/17/17 0835 07-17-2017 2039 07/14/17 0347 07/15/17 0527  WBC 11.9* 9.7 19.1* 22.0* 28.1*  HGB 13.2 11.2* 11.4* 10.9* 12.1  HCT 39.3 34.2* 34.8* 33.6* 37.8  PLT 139* 72* 56* 54* 52*  MCV 91.2 92.5 92.6 92.3 93.6  MCH 30.7 30.4 30.3 30.0 29.9  MCHC 33.6 32.8 32.7 32.5 31.9*  RDW 13.8 14.0 14.2 14.4 14.9*  LYMPHSABS  --  0.2*  --   --   --   MONOABS  --  0.4  --   --   --   EOSABS  --  0.1  --   --   --   BASOSABS  --  0.0  --   --   --     Chemistries  Recent Labs  Lab 07/12/17 1030 07/17/17 0835 07/17/2017 1247 17-Jul-2017 2039 07/14/17 0347 07/15/17 0527  NA 140 136  --  135 138 138  K 4.1 3.9  --  5.0 4.9 4.9  CL 106 106  --  107 111 109  CO2 25 22  --  20* 19* 15*  GLUCOSE 155* 90  --  160* 102* 76  BUN 17 34*  --  41* 45* 68*  CREATININE 0.99 2.19* 2.38* 2.32* 2.49* 2.88*  CALCIUM 9.3 8.3*  --  7.6* 7.2* 8.2*  MG  --   --   --  1.6*  --  1.8  AST  --  64*  --  114*  --   --   ALT  --  31  --  62*  --   --   ALKPHOS  --  54  --   63  --   --   BILITOT  --  1.7*  --  3.0* 1.7*  --    ------------------------------------------------------------------------------------------------------------------ estimated creatinine clearance is 11.7 mL/min (A) (by C-G formula based on SCr of 2.88 mg/dL (H)). ------------------------------------------------------------------------------------------------------------------ No results for input(s): HGBA1C in the last 72  hours. ------------------------------------------------------------------------------------------------------------------ Recent Labs    07/15/2017 1827  TRIG 107   ------------------------------------------------------------------------------------------------------------------ No results for input(s): TSH, T4TOTAL, T3FREE, THYROIDAB in the last 72 hours.  Invalid input(s): FREET3 ------------------------------------------------------------------------------------------------------------------ No results for input(s): VITAMINB12, FOLATE, FERRITIN, TIBC, IRON, RETICCTPCT in the last 72 hours.  Coagulation profile Recent Labs  Lab 07/31/2017 1247 07/23/2017 2039  INR 1.62 1.74    No results for input(s): DDIMER in the last 72 hours.  Cardiac Enzymes Recent Labs  Lab 07/26/2017 2039  TROPONINI 1.07*   ------------------------------------------------------------------------------------------------------------------ Invalid input(s): POCBNP    Assessment & Plan   #1  E. coli sepsis due to UTI Patient's antibiotics changed per intensivist to ceftriaxone   #2 acute renal failure due to nephrolithiasis with left hydronephrosis Patient underwent left retrograde pyelography with stent placement  #3 acute respiratory failure postop Status post extubation requiring high flow oxygen  #4left side effusion with left lower lobe atelectasis or Pneumonia.  Continue antibiotics.   #5 acute metabolic encephalopathy due to above. Aspiration the fall  precaution.  #6 hypotension.  Hold hypertension medication, continue IV fluid support.  #7 thrombocytopenia.  Unclear etiology, possible due to sepsis. Follow-up CBC.  Hold heparin subcu for DVT prophylaxis, give SCD.  #8 dementia  #9 patient DNR if no significant improvement will consider palliative care consult Updated daughter by phone     Code Status Orders  (From admission, onward)        Start     Ordered   07/23/2017 1129  Do not attempt resuscitation (DNR)  Continuous    Question Answer Comment  In the event of cardiac or respiratory ARREST Do not call a "code blue"   In the event of cardiac or respiratory ARREST Do not perform Intubation, CPR, defibrillation or ACLS   In the event of cardiac or respiratory ARREST Use medication by any route, position, wound care, and other measures to relive pain and suffering. May use oxygen, suction and manual treatment of airway obstruction as needed for comfort.      08/01/2017 1128    Code Status History    Date Active Date Inactive Code Status Order ID Comments User Context   This patient has a current code status but no historical code status.    Advance Directive Documentation     Most Recent Value  Type of Advance Directive  Healthcare Power of Attorney, Living will  Pre-existing out of facility DNR order (yellow form or pink MOST form)  No data  "MOST" Form in Place?  No data           Consults  pulm ccr  DVT Prophylaxis SCD Lab Results  Component Value Date   PLT 52 (L) 07/15/2017     Time Spent in minutes   35 minutes  Greater than 50% of time spent in care coordination and counseling patient regarding the condition and plan of care.   Auburn Bilberry M.D on 07/15/2017 at 5:50 PM  Between 7am to 6pm - Pager - [phone]  After 6pm go to [email or web address] - password [email or web address]  [facility]   Office  [phone]

## 2017-07-15 NOTE — Progress Notes (Signed)
Patient has been awake on and off this shift. When awake patient is agitated and impulsive. On Precedex 1.2. NP aware Precedex maxed and told nurse to watch her.

## 2017-07-16 ENCOUNTER — Inpatient Hospital Stay: Payer: PPO

## 2017-07-16 ENCOUNTER — Ambulatory Visit: Payer: Self-pay | Admitting: Urology

## 2017-07-16 DIAGNOSIS — N10 Acute tubulo-interstitial nephritis: Secondary | ICD-10-CM

## 2017-07-16 LAB — CBC
HCT: 41.2 % (ref 35.0–47.0)
Hemoglobin: 13.4 g/dL (ref 12.0–16.0)
MCH: 30.1 pg (ref 26.0–34.0)
MCHC: 32.4 g/dL (ref 32.0–36.0)
MCV: 92.8 fL (ref 80.0–100.0)
Platelets: 53 10*3/uL — ABNORMAL LOW (ref 150–440)
RBC: 4.44 MIL/uL (ref 3.80–5.20)
RDW: 14.7 % — AB (ref 11.5–14.5)
WBC: 36.2 10*3/uL — ABNORMAL HIGH (ref 3.6–11.0)

## 2017-07-16 LAB — COMPREHENSIVE METABOLIC PANEL
ALBUMIN: 2.6 g/dL — AB (ref 3.5–5.0)
ALT: 47 U/L (ref 14–54)
ANION GAP: 11 (ref 5–15)
AST: 20 U/L (ref 15–41)
Alkaline Phosphatase: 103 U/L (ref 38–126)
BUN: 78 mg/dL — ABNORMAL HIGH (ref 6–20)
CO2: 16 mmol/L — AB (ref 22–32)
Calcium: 8.8 mg/dL — ABNORMAL LOW (ref 8.9–10.3)
Chloride: 116 mmol/L — ABNORMAL HIGH (ref 101–111)
Creatinine, Ser: 2.21 mg/dL — ABNORMAL HIGH (ref 0.44–1.00)
GFR calc Af Amer: 21 mL/min — ABNORMAL LOW (ref >60)
GFR calc non Af Amer: 18 mL/min — ABNORMAL LOW (ref >60)
GLUCOSE: 94 mg/dL (ref 65–99)
POTASSIUM: 5 mmol/L (ref 3.5–5.1)
SODIUM: 143 mmol/L (ref 135–145)
TOTAL PROTEIN: 5 g/dL — AB (ref 6.5–8.1)
Total Bilirubin: 1.2 mg/dL (ref 0.3–1.2)

## 2017-07-16 LAB — GLUCOSE, CAPILLARY
GLUCOSE-CAPILLARY: 96 mg/dL (ref 65–99)
Glucose-Capillary: 77 mg/dL (ref 65–99)
Glucose-Capillary: 106 mg/dL — ABNORMAL HIGH (ref 65–99)
Glucose-Capillary: 89 mg/dL (ref 65–99)

## 2017-07-16 LAB — TSH: TSH: 0.66 u[IU]/mL (ref 0.350–4.500)

## 2017-07-16 MED ORDER — MORPHINE SULFATE (PF) 2 MG/ML IV SOLN
1.0000 mg | INTRAVENOUS | Status: DC | PRN
  Administered 2017-07-17 (×2): 2 mg via INTRAVENOUS
  Filled 2017-07-16 (×2): qty 1

## 2017-07-16 MED ORDER — DEXTROSE 5 % IV SOLN
INTRAVENOUS | Status: DC
  Administered 2017-07-16 – 2017-07-18 (×2): via INTRAVENOUS

## 2017-07-16 NOTE — Progress Notes (Signed)
[employer] - [place] [facility]                                                                                                                                                                                  Patient Demographics   Yvette Thomas, is a 84 y.o. female, DOB - 1924/11/06, ZOX:[id number]  Admit date - 19-Jul-2017   Admitting Physician Shaune Pollack, MD  Outpatient Primary MD for the patient is Karie Schwalbe, MD   LOS - 3  Subjective: Continues to require high flow oxygen poorly responsive  Review of Systems:   CONSTITUTIONAL: Drowsy  Vitals:   Vitals:   07/16/17 1000 07/16/17 1100 07/16/17 1200 07/16/17 1300  BP: (!) 129/56 (!) 118/42  (!) 116/47  Pulse: (!) 52 (!) 55 (!) 56 (!) 54  Resp: 15 14 15 13   Temp:      TempSrc:      SpO2: 95% 96% 96% 97%  Weight:      Height:        Wt Readings from Last 3 Encounters:  07-19-17 155 lb 10.3 oz (70.6 kg)  07/12/17 143 lb (64.9 kg)  05/21/17 143 lb (64.9 kg)     Intake/Output Summary (Last 24 hours) at 07/16/2017 1626 Last data filed at 07/16/2017 0900 Gross per 24 hour  Intake 386.16 ml  Output 850 ml  Net -463.84 ml    Physical Exam:   GENERAL: Critically ill-appearing HEAD, EYES, EARS, NOSE AND THROAT: Atraumatic, normocephalic. Extraocular muscles are intact. Pupils equal and reactive to light. Sclerae anicteric. No conjunctival injection. No oro-pharyngeal erythema.  NECK: Supple. There is no jugular venous distention. No bruits, no lymphadenopathy, no thyromegaly.  HEART: Regular rate and rhythm,. No murmurs, no rubs, no clicks.  LUNGS: Clear to auscultation bilaterally. No rales or rhonchi. No wheezes.  ABDOMEN: Soft, flat, nontender, nondistended. Has good bowel sounds. No hepatosplenomegaly appreciated.  EXTREMITIES: No evidence of any cyanosis, clubbing, or peripheral edema.  +2 pedal and radial pulses bilaterally.  NEUROLOGIC: Drowsy SKIN: Moist and warm with no rashes appreciated.   Psych: Not anxious, depressed LN: No inguinal LN enlargement    Antibiotics   Anti-infectives (From admission, onward)   Start     Dose/Rate Route Frequency Ordered Stop   07/15/17 1200  cefTRIAXone (ROCEPHIN) 2 g in dextrose 5 % 50 mL IVPB     2 g 100 mL/hr over 30 Minutes Intravenous Every 24 hours 07/15/17 1048     07/15/17 1100  cefTRIAXone (ROCEPHIN) 1 g in dextrose 5 % 50 mL IVPB  Status:  Discontinued     1 g 100 mL/hr over 30 Minutes Intravenous Every 24 hours  07/15/17 1047 07/15/17 1048   07/14/17 0600  meropenem (MERREM) 500 mg in sodium chloride 0.9 % 50 mL IVPB  Status:  Discontinued     500 mg 100 mL/hr over 30 Minutes Intravenous Every 12 hours 07/14/17 0504 07/15/17 0854   June 12, 2018 2200  piperacillin-tazobactam (ZOSYN) IVPB 3.375 g  Status:  Discontinued     3.375 g 12.5 mL/hr over 240 Minutes Intravenous Every 12 hours June 12, 2018 1415 07/14/17 0504   June 12, 2018 0900  piperacillin-tazobactam (ZOSYN) IVPB 3.375 g     3.375 g 100 mL/hr over 30 Minutes Intravenous  Once June 12, 2018 0853 June 12, 2018 1000   June 12, 2018 0900  vancomycin (VANCOCIN) IVPB 1000 mg/200 mL premix     1,000 mg 200 mL/hr over 60 Minutes Intravenous  Once June 12, 2018 0853 June 12, 2018 1118      Medications   Scheduled Meds: . docusate sodium  100 mg Oral BID  . mouth rinse  15 mL Mouth Rinse BID  . sodium chloride flush  10-40 mL Intracatheter Q12H   Continuous Infusions: . cefTRIAXone (ROCEPHIN)  IV Stopped (07/16/17 1316)  . dextrose 50 mL/hr at 07/16/17 1150   PRN Meds:.acetaminophen **OR** acetaminophen, albuterol, hydrALAZINE, morphine injection, [DISCONTINUED] ondansetron **OR** ondansetron (ZOFRAN) IV, senna-docusate, sodium chloride flush   Data Review:   Micro Results Recent Results (from the past 240 hour(s))  Urine Culture     Status: Abnormal   Collection Time: 07/12/17 10:29 AM  Result Value Ref Range Status   Specimen Description   Final    URINE, CATHETERIZED Performed at [facility], [address] [place], [place] [address]    Special Requests   Final    NONE Performed at [facility], [address] [place], [place] [address]    Culture >=100,000 COLONIES/mL ESCHERICHIA COLI (A)  Final   Report Status 07/14/2017 FINAL  Final   Organism ID, Bacteria ESCHERICHIA COLI (A)  Final      Susceptibility   Escherichia coli - MIC*    AMPICILLIN >=32 RESISTANT Resistant     CEFAZOLIN 16 SENSITIVE Sensitive     CEFTRIAXONE <=1 SENSITIVE Sensitive     CIPROFLOXACIN <=0.25 SENSITIVE Sensitive     GENTAMICIN <=1 SENSITIVE Sensitive     IMIPENEM <=0.25 SENSITIVE Sensitive     NITROFURANTOIN 32 SENSITIVE Sensitive     TRIMETH/SULFA <=20 SENSITIVE Sensitive     AMPICILLIN/SULBACTAM >=32 RESISTANT Resistant     PIP/TAZO 8 SENSITIVE Sensitive     Extended ESBL NEGATIVE Sensitive     * >=100,000 COLONIES/mL ESCHERICHIA COLI  Blood culture (routine x 2)     Status: Abnormal (Preliminary result)   Collection Time: June 12, 2018  8:35 AM  Result Value Ref Range Status   Specimen Description   Final    BLOOD LEFT WRIST Performed at [facility], [address] [place], [place] [address]    Special Requests   Final    BOTTLES DRAWN AEROBIC AND ANAEROBIC Blood Culture adequate volume Performed at [facility], [address] [place], [place] [address]    Culture  Setup Time   Final    GRAM NEGATIVE RODS IN BOTH AEROBIC AND ANAEROBIC BOTTLES CRITICAL RESULT CALLED TO, READ BACK BY AND VERIFIED WITH: MATT MCBANE AT 0435 ON 07/14/17 RWW    Culture ESCHERICHIA COLI (A)  Final   Report Status PENDING  Incomplete  Blood culture (routine x 2)     Status: Abnormal (Preliminary result)   Collection Time: June 12, 2018  8:35 AM  Result  Value Ref Range Status   Specimen Description   Final    LEFT ANTECUBITAL Performed at [facility], [address] [place], [place] [address]    Special Requests   Final    BOTTLES DRAWN AEROBIC AND  ANAEROBIC Blood Culture results may not be optimal due to an excessive volume of blood received in culture bottles Performed at [facility], [address] [place], [place] [address]    Culture  Setup Time   Final    GRAM NEGATIVE RODS CRITICAL RESULT CALLED TO, READ BACK BY AND VERIFIED WITH: MATT MCBANE AT 0435 ON 07/14/17 RWW IN BOTH AEROBIC AND ANAEROBIC BOTTLES    Culture ESCHERICHIA COLI (A)  Final   Report Status PENDING  Incomplete  Blood Culture ID Panel (Reflexed)     Status: Abnormal   Collection Time: 07/31/2017  8:35 AM  Result Value Ref Range Status   Enterococcus species NOT DETECTED NOT DETECTED Final   Listeria monocytogenes NOT DETECTED NOT DETECTED Final   Staphylococcus species NOT DETECTED NOT DETECTED Final   Staphylococcus aureus NOT DETECTED NOT DETECTED Final   Streptococcus species NOT DETECTED NOT DETECTED Final   Streptococcus agalactiae NOT DETECTED NOT DETECTED Final   Streptococcus pneumoniae NOT DETECTED NOT DETECTED Final   Streptococcus pyogenes NOT DETECTED NOT DETECTED Final   Acinetobacter baumannii NOT DETECTED NOT DETECTED Final   Enterobacteriaceae species DETECTED (A) NOT DETECTED Final    Comment: Enterobacteriaceae represent a large family of gram-negative bacteria, not a single organism. CRITICAL RESULT CALLED TO, READ BACK BY AND VERIFIED WITH: MATT MCBANE AT 0435 ON 07/14/17 RWW    Enterobacter cloacae complex NOT DETECTED NOT DETECTED Final   Escherichia coli DETECTED (A) NOT DETECTED Final    Comment: CRITICAL RESULT CALLED TO, READ BACK BY AND VERIFIED WITH: MATT MCBANE AT 0435 ON 07/14/17 RWW    Klebsiella oxytoca NOT DETECTED NOT DETECTED Final   Klebsiella pneumoniae NOT DETECTED NOT DETECTED Final   Proteus species NOT DETECTED NOT DETECTED Final   Serratia marcescens NOT DETECTED NOT DETECTED Final   Carbapenem resistance NOT DETECTED NOT DETECTED Final   Haemophilus influenzae NOT DETECTED NOT DETECTED Final    Neisseria meningitidis NOT DETECTED NOT DETECTED Final   Pseudomonas aeruginosa NOT DETECTED NOT DETECTED Final   Candida albicans NOT DETECTED NOT DETECTED Final   Candida glabrata NOT DETECTED NOT DETECTED Final   Candida krusei NOT DETECTED NOT DETECTED Final   Candida parapsilosis NOT DETECTED NOT DETECTED Final   Candida tropicalis NOT DETECTED NOT DETECTED Final    Comment: Performed at [facility], [address], [place], [place] [address]  Urine culture     Status: Abnormal   Collection Time: 07/15/2017 10:31 AM  Result Value Ref Range Status   Specimen Description   Final    URINE, CATHETERIZED Performed at [facility], [address], [place], [place] [address]    Special Requests   Final    NONE Performed at [facility], [address] [place], [place] [address]    Culture >=100,000 COLONIES/mL ESCHERICHIA COLI (A)  Final   Report Status 07/15/2017 FINAL  Final   Organism ID, Bacteria ESCHERICHIA COLI (A)  Final      Susceptibility   Escherichia coli - MIC*    AMPICILLIN >=32 RESISTANT Resistant     CEFAZOLIN 8 SENSITIVE Sensitive     CEFTRIAXONE <=1 SENSITIVE Sensitive     CIPROFLOXACIN <=0.25 SENSITIVE Sensitive  GENTAMICIN <=1 SENSITIVE Sensitive     IMIPENEM <=0.25 SENSITIVE Sensitive     NITROFURANTOIN <=16 SENSITIVE Sensitive     TRIMETH/SULFA <=20 SENSITIVE Sensitive     AMPICILLIN/SULBACTAM >=32 RESISTANT Resistant     PIP/TAZO <=4 SENSITIVE Sensitive     Extended ESBL NEGATIVE Sensitive     * >=100,000 COLONIES/mL ESCHERICHIA COLI  MRSA PCR Screening     Status: None   Collection Time: 07/17/2017  6:27 PM  Result Value Ref Range Status   MRSA by PCR NEGATIVE NEGATIVE Final    Comment:        The GeneXpert MRSA Assay (FDA approved for NASAL specimens only), is one component of a comprehensive MRSA colonization surveillance program. It is not intended to diagnose MRSA infection nor to guide or monitor treatment  for MRSA infections. Performed at [facility], [address] [place], [place] [address]     Radiology Reports Dg Abd 1 View  Result Date: 07/11/2017 CLINICAL DATA:  Check gastric catheter placement EXAM: ABDOMEN - 1 VIEW COMPARISON:  07/12/2017 FINDINGS: Gastric catheter is noted in the stomach. Left ureteral stent is seen in satisfactory position. Proximal left ureteral stone is again identified. Nonobstructive bowel gas pattern is noted. Right femoral central line is noted. IMPRESSION: Gastric catheter within the stomach. Other tubes and lines as described. Stable proximal left ureteral stone. Electronically Signed   By: Alcide Clever M.D.   On: 07/29/2017 19:39   Ct Head Wo Contrast  Result Date: 07/19/2017 CLINICAL DATA:  Patient found unresponsive. EXAM: CT HEAD WITHOUT CONTRAST TECHNIQUE: Contiguous axial images were obtained from the base of the skull through the vertex without intravenous contrast. COMPARISON:  October 01, 2013 FINDINGS: Brain: No subdural, epidural, or subarachnoid hemorrhage. Cerebellum, brainstem, and basal cisterns are normal. White matter changes are stable. Ventricles and sulci are unremarkable. No mass effect or midline shift. No acute cortical ischemia or infarct. Vascular: No hyperdense vessel or unexpected calcification. Skull: Normal. Negative for fracture or focal lesion. Sinuses/Orbits: Mild mucosal thickening in the ethmoid sinuses. Paranasal sinuses, mastoid air cells, and middle ears are otherwise normal. Other: There is soft tissue swelling over the left side of the scalp which has increased since 2,015. IMPRESSION: 1. No acute intracranial abnormality identified. 2. Soft tissue thickening in the left scalp. Recommend clinical correlation. 3. Mild mucosal thickening in the ethmoid sinuses. Electronically Signed   By: Gerome Sam III M.D   On: 07/12/2017 10:40   US Renal  Result Date: 07/16/2017 CLINICAL DATA:  Left hydronephrosis due to  kidney  stone. EXAM: RENAL / URINARY TRACT ULTRASOUND COMPLETE COMPARISON:  CT abdomen pelvis 07/12/2017 FINDINGS: Right Kidney: Length: 11.0 cm. 5.5 cm right lower pole cyst. Echogenicity within normal limits. No mass or hydronephrosis visualized. Left Kidney: Length: 12.2 cm.  Negative for hydronephrosis.  No renal mass. Bladder: Distended urinary bladder with Foley catheter. Question clamps catheter versus obstruction of the catheter. IMPRESSION: Negative for hydronephrosis. Mild hydronephrosis and obstructing stone noted on recent CT Urinary bladder distended despite Foley catheter. Question obstructed or clamped Foley catheter Electronically Signed   By: Marlan Palau M.D.   On: 07/16/2017 10:55   Dg Chest Port 1 View  Result Date: 07/16/2017 CLINICAL DATA:  Respiratory failure. EXAM: PORTABLE CHEST 1 VIEW COMPARISON:  07/17/2017. FINDINGS: Interim removal of NG tube. Mediastinum and hilar structures normal. Cardiomegaly with normal pulmonary vascularity. Mild bibasilar atelectasis. Small left pleural effusion cannot be excluded. No pneumothorax. Degenerative changes thoracic spine. IMPRESSION:  1.  Interim removal of NG tube. 2.  Cardiomegaly with normal pulmonary vascularity. 3. Low lung volumes with bibasilar atelectasis. Small left pleural effusion cannot be excluded. Electronically Signed   By: Maisie Fus  Register   On: 07/16/2017 06:42   Dg Chest Port 1 View  Result Date: 07/10/2017 CLINICAL DATA:  Check endotracheal tube placement EXAM: PORTABLE CHEST 1 VIEW COMPARISON:  07/26/2017 FINDINGS: Endotracheal tube and nasogastric catheter are now seen in satisfactory position. Cardiac shadow is enlarged but stable. Right hemidiaphragm is again elevated but stable. No focal infiltrate or sizable effusion is noted. Previously seen changes in the bases were likely related incomplete aeration. IMPRESSION: Tubes and lines as described above. Improved aeration in the bases bilaterally. Electronically Signed   By: Alcide Clever M.D.   On: 07/28/2017 19:38   Dg Chest Portable 1 View  Result Date: 07/06/2017 CLINICAL DATA:  Hypoxia EXAM: PORTABLE CHEST 1 VIEW COMPARISON:  None. FINDINGS: Cardiomegaly. Elevation of the right hemidiaphragm. The left lower lobe airspace opacity, possible pneumonia. Suspect small left effusion. Right base atelectasis. IMPRESSION: Cardiomegaly. Left side effusion with left lower lobe atelectasis or pneumonia. Right base atelectasis. Electronically Signed   By: Charlett Nose M.D.   On: 07/12/2017 09:22   Ct Renal Stone Study  Result Date: 07/12/2017 CLINICAL DATA:  Sharp left flank pain EXAM: CT ABDOMEN AND PELVIS WITHOUT CONTRAST TECHNIQUE: Multidetector CT imaging of the abdomen and pelvis was performed following the standard protocol without IV contrast. COMPARISON:  None. FINDINGS: Lower chest: Cardiomegaly.  No acute findings. Hepatobiliary: No focal hepatic abnormality. Gallbladder unremarkable. Pancreas: No focal abnormality or ductal dilatation. Spleen: No focal abnormality.  Normal size. Adrenals/Urinary Tract: Mild to moderate left hydronephrosis and perinephric stranding due to 7 mm proximal left ureteral stone. No hydronephrosis on the right. Punctate nonobstructing stones in the lower pole of the left kidney. Adrenal glands and urinary bladder unremarkable.5.6 cm low-density lesion off the lower pole of the right kidney likely reflects a cyst although this cannot be characterized without intravenous contrast. Stomach/Bowel: Sigmoid diverticulosis. No active diverticulitis. Appendix is normal. Stomach and small bowel decompressed, unremarkable. Vascular/Lymphatic: Diffuse aortic and iliac calcifications. No evidence of aneurysm or adenopathy. Reproductive: Uterus and adnexa unremarkable.  No mass. Other: No free fluid or free air. Musculoskeletal: No acute bony abnormality. IMPRESSION: 7 mm proximal left ureteral stone with mild to moderate left hydronephrosis and perinephric stranding.  Left lower pole punctate nonobstructing nephrolithiasis. Aortoiliac atherosclerosis. Sigmoid diverticulosis. Cardiomegaly. Electronically Signed   By: Charlett Nose M.D.   On: 07/12/2017 12:02     CBC Recent Labs  Lab 07/07/2017 0835 08/01/2017 2039 07/14/17 0347 07/15/17 0527 07/16/17 0627  WBC 9.7 19.1* 22.0* 28.1* 36.2*  HGB 11.2* 11.4* 10.9* 12.1 13.4  HCT 34.2* 34.8* 33.6* 37.8 41.2  PLT 72* 56* 54* 52* 53*  MCV 92.5 92.6 92.3 93.6 92.8  MCH 30.4 30.3 30.0 29.9 30.1  MCHC 32.8 32.7 32.5 31.9* 32.4  RDW 14.0 14.2 14.4 14.9* 14.7*  LYMPHSABS 0.2*  --   --   --   --   MONOABS 0.4  --   --   --   --   EOSABS 0.1  --   --   --   --   BASOSABS 0.0  --   --   --   --     Chemistries  Recent Labs  Lab 07/16/2017 0835 07/12/2017 1247 08/01/2017 2039 07/14/17 0347 07/15/17 0527 07/16/17 0627  NA 136  --  135 138 138 143  K 3.9  --  5.0 4.9 4.9 5.0  CL 106  --  107 111 109 116*  CO2 22  --  20* 19* 15* 16*  GLUCOSE 90  --  160* 102* 76 94  BUN 34*  --  41* 45* 68* 78*  CREATININE 2.19* 2.38* 2.32* 2.49* 2.88* 2.21*  CALCIUM 8.3*  --  7.6* 7.2* 8.2* 8.8*  MG  --   --  1.6*  --  1.8  --   AST 64*  --  114*  --   --  20  ALT 31  --  62*  --   --  47  ALKPHOS 54  --  63  --   --  103  BILITOT 1.7*  --  3.0* 1.7*  --  1.2   ------------------------------------------------------------------------------------------------------------------ estimated creatinine clearance is 15.3 mL/min (A) (by C-G formula based on SCr of 2.21 mg/dL (H)). ------------------------------------------------------------------------------------------------------------------ No results for input(s): HGBA1C in the last 72 hours. ------------------------------------------------------------------------------------------------------------------ Recent Labs    07/07/2017 1827  TRIG 107   ------------------------------------------------------------------------------------------------------------------ Recent Labs     07/16/17 0627  TSH 0.660   ------------------------------------------------------------------------------------------------------------------ No results for input(s): VITAMINB12, FOLATE, FERRITIN, TIBC, IRON, RETICCTPCT in the last 72 hours.  Coagulation profile Recent Labs  Lab 07/10/2017 1247 07/26/2017 2039  INR 1.62 1.74    No results for input(s): DDIMER in the last 72 hours.  Cardiac Enzymes Recent Labs  Lab 07/14/2017 2039  TROPONINI 1.07*   ------------------------------------------------------------------------------------------------------------------ Invalid input(s): POCBNP    Assessment & Plan   #1  E. coli sepsis due to UTI Patient's antibiotics changed per intensivist to ceftriaxone White blood cell count worst  #2 acute renal failure due to nephrolithiasis with left hydronephrosis-improved Patient underwent left retrograde pyelography with stent placement Repeat ultrasound of the renal showed no obstruction  #3 acute respiratory failure postop Status post extubation requiring high flow oxygen  #4left side effusion with left lower lobe atelectasis or Possible pneumonia.  Continue antibiotics.   #5 acute metabolic encephalopathy due to above. Aspiration the fall precaution.  #6 hypotension.  Hold hypertension medication, continue IV fluid support.  #7 thrombocytopenia.  Unclear etiology, possible due to sepsis. Follow-up CBC.  Hold heparin subcu for DVT prophylaxis, give SCD.  #8 dementia  #9 patient DNR  Prognosis poor plan to transfer to the floor if no improvement by tomorrow may consider transition to comfort care     Code Status Orders  (From admission, onward)        Start     Ordered   07/23/2017 1129  Do not attempt resuscitation (DNR)  Continuous    Question Answer Comment  In the event of cardiac or respiratory ARREST Do not call a "code blue"   In the event of cardiac or respiratory ARREST Do not perform Intubation, CPR,  defibrillation or ACLS   In the event of cardiac or respiratory ARREST Use medication by any route, position, wound care, and other measures to relive pain and suffering. May use oxygen, suction and manual treatment of airway obstruction as needed for comfort.      07/21/2017 1128    Code Status History    Date Active Date Inactive Code Status Order ID Comments User Context   This patient has a current code status but no historical code status.    Advance Directive Documentation     Most Recent Value  Type of Advance Directive  Healthcare Power of [place], Living will  Pre-existing out of facility DNR order (yellow form or pink MOST form)  No data  "MOST" Form in Place?  No data           Consults  pulm ccr  DVT Prophylaxis SCD Lab Results  Component Value Date   PLT 53 (L) 07/16/2017     Time Spent in minutes   35 minutes  Greater than 50% of time spent in care coordination and counseling patient regarding the condition and plan of care.   Auburn Bilberry M.D on 07/16/2017 at 4:26 PM  Between 7am to 6pm - Pager - [phone]  After 6pm go to [email or web address] - password [email or web address] [facility]  [facility]   Office  [phone]

## 2017-07-16 NOTE — Progress Notes (Signed)
Somnolent off of Precedex No respiratory distress Not following commands  Vitals:   07/16/17 1000 07/16/17 1100 07/16/17 1200 07/16/17 1300  BP: (!) 129/56 (!) 118/42  (!) 116/47  Pulse: (!) 52 (!) 55 (!) 56 (!) 54  Resp: 15 14 15 13   Temp:      TempSrc:      SpO2: 95% 96% 96% 97%  Weight:      Height:         Gen: Somnolent, responsive only to pain, no overt respiratory distress HEENT: NCAT, sclerae white Neck: No LAN, no JVD noted Lungs: Bilateral crackles Cardiovascular: Huston FoleyBrady, reg, no M Abdomen: Soft, NT, +BS Ext: no C/C/E   BMP Latest Ref Rng & Units 07/16/2017 07/15/2017 07/14/2017  Glucose 65 - 99 mg/dL 94 76 644(I102(H)  BUN 6 - 20 mg/dL 34(V78(H) 42(V68(H) 95(G45(H)  Creatinine 0.44 - 1.00 mg/dL 3.87(F2.21(H) 6.43(P2.88(H) 2.95(J2.49(H)  Sodium 135 - 145 mmol/L 143 138 138  Potassium 3.5 - 5.1 mmol/L 5.0 4.9 4.9  Chloride 101 - 111 mmol/L 116(H) 109 111  CO2 22 - 32 mmol/L 16(L) 15(L) 19(L)  Calcium 8.9 - 10.3 mg/dL 8.8(C8.8(L) 1.6(S8.2(L) 7.2(L)    CBC Latest Ref Rng & Units 07/16/2017 07/15/2017 07/14/2017  WBC 3.6 - 11.0 K/uL 36.2(H) 28.1(H) 22.0(H)  Hemoglobin 12.0 - 16.0 g/dL 06.313.4 01.612.1 10.9(L)  Hematocrit 35.0 - 47.0 % 41.2 37.8 33.6(L)  Platelets 150 - 440 K/uL 53(L) 52(L) 54(L)    CXR: CM, vasc cong, LLL atx/infiltrate  01/15 Renal US: no hydro  IMPRESSION: Severe sepsis Septic shock, resolved Pyelonephritis due to ureteral stone and hydro  S/P ureteral stent E coli bacteremia  AKI, now nonoliguric - not a candidate for HD Acute delirium - now hypoactive Sinus bradycardia - TSH WNL DNR   PLAN: Resume IVFs until taking POs Cont ceftriaxone DC dexmedetomidine Transfer to MedSurg Family updated in detail: I spoke at length to her sister, daughter and son in law. We discussed prognosis (guarded/uncertain), best possible survival and options of therapy. They are increasingly desiring a more palliative approach.  After our discussion, we agreed to continue antibiotics, supplemental oxygen and  IV fluids.  Nutritional support was not specifically addressed but I think they would opt against placing an NG tube.  If at any point, patient exhibits discomfort, we will treat with morphine as needed to provide guaranteed comfort.  I promised that I would look in on her on the day following this encounter    Billy Fischeravid Kecia Swoboda, MD PCCM service Mobile [phone] Pager [phone] 07/16/2017 1:48 PM

## 2017-07-17 LAB — LACTIC ACID, PLASMA: LACTIC ACID, VENOUS: 1.1 mmol/L (ref 0.5–1.9)

## 2017-07-17 LAB — CBC
HEMATOCRIT: 39.3 % (ref 35.0–47.0)
Hemoglobin: 12.5 g/dL (ref 12.0–16.0)
MCH: 29.2 pg (ref 26.0–34.0)
MCHC: 31.8 g/dL — AB (ref 32.0–36.0)
MCV: 91.7 fL (ref 80.0–100.0)
PLATELETS: 58 10*3/uL — AB (ref 150–440)
RBC: 4.28 MIL/uL (ref 3.80–5.20)
RDW: 14.4 % (ref 11.5–14.5)
WBC: 52.8 10*3/uL — AB (ref 3.6–11.0)

## 2017-07-17 LAB — BASIC METABOLIC PANEL
Anion gap: 13 (ref 5–15)
BUN: 70 mg/dL — AB (ref 6–20)
CO2: 16 mmol/L — ABNORMAL LOW (ref 22–32)
CREATININE: 1.6 mg/dL — AB (ref 0.44–1.00)
Calcium: 9 mg/dL (ref 8.9–10.3)
Chloride: 116 mmol/L — ABNORMAL HIGH (ref 101–111)
GFR calc Af Amer: 31 mL/min — ABNORMAL LOW (ref >60)
GFR, EST NON AFRICAN AMERICAN: 27 mL/min — AB (ref >60)
GLUCOSE: 128 mg/dL — AB (ref 65–99)
POTASSIUM: 3.8 mmol/L (ref 3.5–5.1)
SODIUM: 145 mmol/L (ref 135–145)

## 2017-07-17 LAB — MAGNESIUM: Magnesium: 1.9 mg/dL (ref 1.7–2.4)

## 2017-07-17 LAB — GLUCOSE, CAPILLARY: GLUCOSE-CAPILLARY: 131 mg/dL — AB (ref 65–99)

## 2017-07-17 LAB — CULTURE, BLOOD (ROUTINE X 2): SPECIAL REQUESTS: ADEQUATE

## 2017-07-17 LAB — PHOSPHORUS: PHOSPHORUS: 3 mg/dL (ref 2.5–4.6)

## 2017-07-17 LAB — PROCALCITONIN: Procalcitonin: 3.02 ng/mL

## 2017-07-17 MED ORDER — VANCOMYCIN HCL IN DEXTROSE 1-5 GM/200ML-% IV SOLN
1000.0000 mg | Freq: Once | INTRAVENOUS | Status: AC
  Administered 2017-07-17: 1000 mg via INTRAVENOUS
  Filled 2017-07-17: qty 200

## 2017-07-17 MED ORDER — SODIUM CHLORIDE 0.9 % IV SOLN
1.0000 g | Freq: Two times a day (BID) | INTRAVENOUS | Status: DC
  Administered 2017-07-17 (×3): 1 g via INTRAVENOUS
  Filled 2017-07-17 (×5): qty 1

## 2017-07-17 MED ORDER — RACEPINEPHRINE HCL 2.25 % IN NEBU
0.5000 mL | INHALATION_SOLUTION | Freq: Once | RESPIRATORY_TRACT | Status: DC
  Filled 2017-07-17: qty 0.5

## 2017-07-17 MED ORDER — ETOMIDATE 2 MG/ML IV SOLN: 0.3000 mg/kg | Freq: Once | INTRAVENOUS | Status: DC

## 2017-07-17 NOTE — Progress Notes (Signed)
Pharmacy Antibiotic Note  Yvette Thomas is a 84 y.o. female admitted on 07/23/2017 with sepsis, ?pneumonia, UTI  Pharmacy has been consulted for Vancomycin and Zosyn dosing.  Plan: Patient received Zosyn 3.375 gm IV x 1 in ER and Vancomycin 1 gram IV x 1 in ER. -continue Zosyn EI 3.375gm IV q12h for Crcl < 20 ml/min.  -Vancomycin: With Crcl 13 ml/min in 84 yo, will dose Vancomycin per levels with unstable renal fxn (Scr in ER on 07/12/17 = 0.99, now 2.38). Wt 66.7 kg  Ht 364ft 11 in    Adj BW= 53.1 kg. Will order Random Vancomycin level on 07/14/17 at 1000 (24 hrs after 1st dose).   1/13 Zosyn changed to meropenem for BCID + for E coli  Patient was taken off meropenem and placed on rocephin; however, patient's status not improving WBC continues to increase 9.7 >> 19.1 >> 22.0 >> 36.2 >> 52.8.  Will place patient back on Meropenem 1g IV q12h for CrCl 26 - 50 mL/min   Height: 5\' 2"  (157.5 cm) Weight: 155 lb 10.3 oz (70.6 kg) IBW/kg (Calculated) : 50.1  Temp (24hrs), Avg:98.1 F (36.7 C), Min:97.7 F (36.5 C), Max:98.6 F (37 C)  Recent Labs  Lab 07/08/2017 0835 07/12/2017 1031  07/29/2017 2039 07/14/17 0347 07/15/17 0527 07/16/17 0627 07/17/17 0016 07/17/17 0054  WBC 9.7  --   --  19.1* 22.0* 28.1* 36.2* 52.8*  --   CREATININE 2.19*  --    < > 2.32* 2.49* 2.88* 2.21* 1.60*  --   LATICACIDVEN 3.3* 3.1*  --  2.8*  --   --   --   --  1.1   < > = values in this interval not displayed.    Estimated Creatinine Clearance: 21.1 mL/min (A) (by C-G formula based on SCr of 1.6 mg/dL (H)).    Allergies  Allergen Reactions  . Buspirone Hcl     REACTION: Tinnitis, dizzy  . Oxybutynin Chloride     REACTION: Dried up  . Tolterodine Tartrate     REACTION: Headache    Antimicrobials this admission: Vanc 1/12 >>  1/12 Zosyn 1/12 >>  1/13 Merrem 1/13 >> 1/14; 1/16 >> CTX 1/14 >> 1/16  Dose adjustments this admission:    Microbiology results: 1/12 BCx: E. Coli  1/12 UCx: E. Coli  resistant to ampicillin and Unasyn (AmpC inducible E. Coli)   Sputum:      MRSA PCR:  NG  Thank you for allowing pharmacy to be a part of this patient's care.  Thomasene Rippleavid Pernella Ackerley, PharmD, BCPS Clinical Pharmacist 07/17/2017

## 2017-07-17 NOTE — Progress Notes (Signed)
Initial Nutrition Assessment  DOCUMENTATION CODES:   Not applicable  INTERVENTION:  No appropriate nutrition intervention at this time as patient is NPO.  Will continue to monitor discussions regarding goals of care and recommend nutrition interventions as appropriate.  NUTRITION DIAGNOSIS:   Inadequate oral intake related to inability to eat as evidenced by NPO status.  GOAL:   Patient will meet greater than or equal to 90% of their needs  MONITOR:   PO intake, Diet advancement, Labs, Weight trends, Skin, I & O's  REASON FOR ASSESSMENT:   Low Braden    ASSESSMENT:   84 year old female with PMHx of GERD, HLD, HTN, hypothyroidism, OP, OA, anxiety, now admitted with sepsis due to UTI, acute renal failure due to nephrolithiasis with left hydronephrosis s/p left retrograde pyelography with stent placement on 1/12, left side effusion with left lower lobe atelectasis.   Patient is poorly responsive. Unable to provide any history and no family members at bedside. Patient has remained NPO during this admission (currently day 4 NPO status). Per chart family is leaning more towards palliative approach and would likely refuse an NGT. Will continue to monitor discussions.  Unsure if weights in chart are true measured weights. Unable to trend weight.  Medications reviewed and include: Colace, D5W at 50 mL/hr (60 grams dextrose, 204 kcal daily), meropenem.  Labs reviewed: Chloride 116, CO2 16, BUN 70, Creatinine 1.6.  Patient is at risk for malnutrition.  Discussed with RN.  NUTRITION - FOCUSED PHYSICAL EXAM:  Unable to complete NFPE at this time. Will monitor goals of care.  Diet Order:  Diet NPO time specified Except for: Sips with Meds  EDUCATION NEEDS:   Not appropriate for education at this time  Skin:  Skin Assessment: Reviewed RN Assessment  Last BM:  Unknown  Height:   Ht Readings from Last 1 Encounters:  11-09-2017 5\' 2"  (1.575 m)    Weight:   Wt Readings  from Last 1 Encounters:  11-09-2017 155 lb 10.3 oz (70.6 kg)    Ideal Body Weight:  50 kg  BMI:  Body mass index is 28.47 kg/m.  Estimated Nutritional Needs:   Kcal:  1300-1500 (MSJ x 1.2-1.4)  Protein:  85-100 grams (1.2-1.4 grams/kg)  Fluid:  1.3-1.5 L/day (1 mL/kcal)  Helane RimaLeanne Angie Hogg, MS, RD, LDN Office: [phone] Pager: [phone] After Hours/Weekend Pager: [phone]

## 2017-07-17 NOTE — Progress Notes (Signed)
[facility].  VSS, O2 tank with patient, chart, meds, and belongings to go with her.  Family called

## 2017-07-17 NOTE — Progress Notes (Signed)
More awake and responsive No respiratory distress + following commands  Vitals:   07/17/17 0800 07/17/17 0900 07/17/17 1000 07/17/17 1200  BP: (!) 156/62 (!) 175/66 (!) 166/92   Pulse: 83 77    Resp: 18 17 (!) 22   Temp: 98.2 F (36.8 C)   98.9 F (37.2 C)  TempSrc: Axillary   Axillary  SpO2: 98% 97%    Weight:      Height:         Gen: NAD, no respiratory distress HEENT: NCAT, sclerae white Neck: No LAN, no JVD noted Lungs: Bilateral crackles Cardiovascular: Reg, no M Abdomen: Soft, NT, +BS Ext: no C/C/E   BMP Latest Ref Rng & Units 07/17/2017 07/16/2017 07/15/2017  Glucose 65 - 99 mg/dL 409(W128(H) 94 76  BUN 6 - 20 mg/dL 11(B70(H) 14(N78(H) 82(N68(H)  Creatinine 0.44 - 1.00 mg/dL 5.62(Z1.60(H) 3.08(M2.21(H) 5.78(I2.88(H)  Sodium 135 - 145 mmol/L 145 143 138  Potassium 3.5 - 5.1 mmol/L 3.8 5.0 4.9  Chloride 101 - 111 mmol/L 116(H) 116(H) 109  CO2 22 - 32 mmol/L 16(L) 16(L) 15(L)  Calcium 8.9 - 10.3 mg/dL 9.0 6.9(G8.8(L) 2.9(B8.2(L)    CBC Latest Ref Rng & Units 07/17/2017 07/16/2017 07/15/2017  WBC 3.6 - 11.0 K/uL 52.8(HH) 36.2(H) 28.1(H)  Hemoglobin 12.0 - 16.0 g/dL 28.412.5 13.213.4 44.012.1  Hematocrit 35.0 - 47.0 % 39.3 41.2 37.8  Platelets 150 - 440 K/uL 58(L) 53(L) 52(L)    CXR: No new film  Results for orders placed or performed during the hospital encounter of 07/05/2017  Blood culture (routine x 2)     Status: Abnormal   Collection Time: 07/07/2017  8:35 AM  Result Value Ref Range Status   Specimen Description   Final    BLOOD LEFT WRIST Performed at [facility], [address] [place], [place] [address]    Special Requests   Final    BOTTLES DRAWN AEROBIC AND ANAEROBIC Blood Culture adequate volume Performed at [facility], [address] [place], [place] [address]    Culture  Setup Time   Final    GRAM NEGATIVE RODS IN BOTH AEROBIC AND ANAEROBIC BOTTLES CRITICAL RESULT CALLED TO, READ BACK BY AND VERIFIED WITH: MATT MCBANE AT 0435 ON 07/14/17 RWW    Culture (A)  Final    ESCHERICHIA  COLI SUSCEPTIBILITIES PERFORMED ON PREVIOUS CULTURE WITHIN THE LAST 5 DAYS. Performed at [facility], [address] [place], [place] [address]    Report Status 07/17/2017 FINAL  Final  Blood culture (routine x 2)     Status: Abnormal   Collection Time: 07/24/2017  8:35 AM  Result Value Ref Range Status   Specimen Description   Final    LEFT ANTECUBITAL Performed at [facility], [address] [place], [place] [address]    Special Requests   Final    BOTTLES DRAWN AEROBIC AND ANAEROBIC Blood Culture results may not be optimal due to an excessive volume of blood received in culture bottles Performed at [facility], [address] [place], [place] [address]    Culture  Setup Time   Final    GRAM NEGATIVE RODS CRITICAL RESULT CALLED TO, READ BACK BY AND VERIFIED WITH: MATT MCBANE AT 0435 ON 07/14/17 RWW IN BOTH AEROBIC AND ANAEROBIC BOTTLES    Culture ESCHERICHIA COLI (A)  Final   Report Status 07/17/2017 FINAL  Final   Organism ID, Bacteria ESCHERICHIA COLI  Final      Susceptibility  Escherichia coli - MIC*    AMPICILLIN >=32 RESISTANT Resistant     CEFAZOLIN >=64 RESISTANT Resistant     CEFEPIME <=1 SENSITIVE Sensitive     CEFTAZIDIME <=1 SENSITIVE Sensitive     CEFTRIAXONE <=1 SENSITIVE Sensitive     CIPROFLOXACIN <=0.25 SENSITIVE Sensitive     GENTAMICIN <=1 SENSITIVE Sensitive     IMIPENEM <=0.25 SENSITIVE Sensitive     TRIMETH/SULFA <=20 SENSITIVE Sensitive     AMPICILLIN/SULBACTAM >=32 RESISTANT Resistant     PIP/TAZO 8 SENSITIVE Sensitive     Extended ESBL NEGATIVE Sensitive     * ESCHERICHIA COLI  Blood Culture ID Panel (Reflexed)     Status: Abnormal   Collection Time: 07/24/2017  8:35 AM  Result Value Ref Range Status   Enterococcus species NOT DETECTED NOT DETECTED Final   Listeria monocytogenes NOT DETECTED NOT DETECTED Final   Staphylococcus species NOT DETECTED NOT DETECTED Final   Staphylococcus aureus NOT DETECTED NOT DETECTED  Final   Streptococcus species NOT DETECTED NOT DETECTED Final   Streptococcus agalactiae NOT DETECTED NOT DETECTED Final   Streptococcus pneumoniae NOT DETECTED NOT DETECTED Final   Streptococcus pyogenes NOT DETECTED NOT DETECTED Final   Acinetobacter baumannii NOT DETECTED NOT DETECTED Final   Enterobacteriaceae species DETECTED (A) NOT DETECTED Final    Comment: Enterobacteriaceae represent a large family of gram-negative bacteria, not a single organism. CRITICAL RESULT CALLED TO, READ BACK BY AND VERIFIED WITH: MATT MCBANE AT 0435 ON 07/14/17 RWW    Enterobacter cloacae complex NOT DETECTED NOT DETECTED Final   Escherichia coli DETECTED (A) NOT DETECTED Final    Comment: CRITICAL RESULT CALLED TO, READ BACK BY AND VERIFIED WITH: MATT MCBANE AT 0435 ON 07/14/17 RWW    Klebsiella oxytoca NOT DETECTED NOT DETECTED Final   Klebsiella pneumoniae NOT DETECTED NOT DETECTED Final   Proteus species NOT DETECTED NOT DETECTED Final   Serratia marcescens NOT DETECTED NOT DETECTED Final   Carbapenem resistance NOT DETECTED NOT DETECTED Final   Haemophilus influenzae NOT DETECTED NOT DETECTED Final   Neisseria meningitidis NOT DETECTED NOT DETECTED Final   Pseudomonas aeruginosa NOT DETECTED NOT DETECTED Final   Candida albicans NOT DETECTED NOT DETECTED Final   Candida glabrata NOT DETECTED NOT DETECTED Final   Candida krusei NOT DETECTED NOT DETECTED Final   Candida parapsilosis NOT DETECTED NOT DETECTED Final   Candida tropicalis NOT DETECTED NOT DETECTED Final    Comment: Performed at [facility], [address] [place], [place] [address]  Urine culture     Status: Abnormal   Collection Time: 07/25/2017 10:31 AM  Result Value Ref Range Status   Specimen Description   Final    URINE, CATHETERIZED Performed at [facility], [address] [place], [place] [address]    Special Requests   Final    NONE Performed at [facility], [address]  [place], [place] [address]    Culture >=100,000 COLONIES/mL ESCHERICHIA COLI (A)  Final   Report Status 07/15/2017 FINAL  Final   Organism ID, Bacteria ESCHERICHIA COLI (A)  Final      Susceptibility   Escherichia coli - MIC*    AMPICILLIN >=32 RESISTANT Resistant     CEFAZOLIN 8 SENSITIVE Sensitive     CEFTRIAXONE <=1 SENSITIVE Sensitive     CIPROFLOXACIN <=0.25 SENSITIVE Sensitive     GENTAMICIN <=1 SENSITIVE Sensitive     IMIPENEM <=0.25 SENSITIVE Sensitive     NITROFURANTOIN <=16 SENSITIVE Sensitive  TRIMETH/SULFA <=20 SENSITIVE Sensitive     AMPICILLIN/SULBACTAM >=32 RESISTANT Resistant     PIP/TAZO <=4 SENSITIVE Sensitive     Extended ESBL NEGATIVE Sensitive     * >=100,000 COLONIES/mL ESCHERICHIA COLI  MRSA PCR Screening     Status: None   Collection Time: 07/29/2017  6:27 PM  Result Value Ref Range Status   MRSA by PCR NEGATIVE NEGATIVE Final    Comment:        The GeneXpert MRSA Assay (FDA approved for NASAL specimens only), is one component of a comprehensive MRSA colonization surveillance program. It is not intended to diagnose MRSA infection nor to guide or monitor treatment for MRSA infections. Performed at [facility], [address] [place], [place] [address]   Culture, blood (Routine X 2) w Reflex to ID Panel     Status: None (Preliminary result)   Collection Time: 07/17/17  2:07 AM  Result Value Ref Range Status   Specimen Description BLOOD RIGHT [facility]  Final   Special Requests   Final    BOTTLES DRAWN AEROBIC AND ANAEROBIC Blood Culture adequate volume   Culture   Final    NO GROWTH < 12 HOURS Performed at [facility], [address] [place], [place] [address]    Report Status PENDING  Incomplete  Culture, blood (Routine X 2) w Reflex to ID Panel     Status: None (Preliminary result)   Collection Time: 07/17/17  2:12 AM  Result Value Ref Range Status   Specimen Description BLOOD RIGHT FORE ARM  Final   Special Requests   Final     BOTTLES DRAWN AEROBIC AND ANAEROBIC Blood Culture adequate volume   Culture   Final    NO GROWTH < 12 HOURS Performed at [facility], [address], [place], [place] [address]    Report Status PENDING  Incomplete   Anti-infectives (From admission, onward)   Start     Dose/Rate Route Frequency Ordered Stop   07/17/17 0300  meropenem (MERREM) 1 g in sodium chloride 0.9 % 100 mL IVPB     1 g 200 mL/hr over 30 Minutes Intravenous Every 12 hours 07/17/17 0254     07/15/17 1200  cefTRIAXone (ROCEPHIN) 2 g in dextrose 5 % 50 mL IVPB  Status:  Discontinued     2 g 100 mL/hr over 30 Minutes Intravenous Every 24 hours 07/15/17 1048 07/17/17 0254   07/15/17 1100  cefTRIAXone (ROCEPHIN) 1 g in dextrose 5 % 50 mL IVPB  Status:  Discontinued     1 g 100 mL/hr over 30 Minutes Intravenous Every 24 hours 07/15/17 1047 07/15/17 1048   07/14/17 0600  meropenem (MERREM) 500 mg in sodium chloride 0.9 % 50 mL IVPB  Status:  Discontinued     500 mg 100 mL/hr over 30 Minutes Intravenous Every 12 hours 07/14/17 0504 07/15/17 0854   07/12/2017 2200  piperacillin-tazobactam (ZOSYN) IVPB 3.375 g  Status:  Discontinued     3.375 g 12.5 mL/hr over 240 Minutes Intravenous Every 12 hours 07/05/2017 1415 07/14/17 0504   07/18/2017 0900  piperacillin-tazobactam (ZOSYN) IVPB 3.375 g     3.375 g 100 mL/hr over 30 Minutes Intravenous  Once 07/10/2017 0853 07/11/2017 1000   07/12/2017 0900  vancomycin (VANCOCIN) IVPB 1000 mg/200 mL premix     1,000 mg 200 mL/hr over 60 Minutes Intravenous  Once 07/19/2017 0853 07/31/2017 1118       IMPRESSION: Severe sepsis Septic shock, resolved Pyelonephritis due to  ureteral stone and hydro  S/P ureteral stent E coli bacteremia  AKI, improving Acute delirium -improving Worsening leukocytosis but CPT improving Thrombocytopenia DNR   PLAN: Continue IVFs until taking POs Antibiotics changed from ceftriaxone back to meropenem Advance diet as able Transfer to MedSurg  01/16 Family updated in detail at bedside  After transfer, PCCM will sign off. Please call if we can be of further assistance   Billy Fischer, MD PCCM service Mobile [phone] Pager [phone] 07/17/2017 1:21 PM

## 2017-07-17 NOTE — Progress Notes (Signed)
Pharmacy Antibiotic Note  Yvette Thomas is a 84 y.o. female admitted on 07/30/2017 with pneumonia.  Pharmacy has been consulted for vancomycin dosing.  Patient is also receiving meropenem.  Plan: Patient is in AKI, therefore will order vancomycin 1000 mg IV once and check 24 hour level. Redose when level <15-20 mcg/mL.  Height: 5\' 2"  (157.5 cm) Weight: 155 lb 10.3 oz (70.6 kg) IBW/kg (Calculated) : 50.1  Temp (24hrs), Avg:98.5 F (36.9 C), Min:97.9 F (36.6 C), Max:98.9 F (37.2 C)  Recent Labs  Lab 07/11/2017 0835 07/02/2017 1031  07/06/2017 2039 07/14/17 0347 07/15/17 0527 07/16/17 0627 07/17/17 0016 07/17/17 0054  WBC 9.7  --   --  19.1* 22.0* 28.1* 36.2* 52.8*  --   CREATININE 2.19*  --    < > 2.32* 2.49* 2.88* 2.21* 1.60*  --   LATICACIDVEN 3.3* 3.1*  --  2.8*  --   --   --   --  1.1   < > = values in this interval not displayed.    Estimated Creatinine Clearance: 21.1 mL/min (A) (by C-G formula based on SCr of 1.6 mg/dL (H)).    Allergies  Allergen Reactions  . Buspirone Hcl     REACTION: Tinnitis, dizzy  . Oxybutynin Chloride     REACTION: Dried up  . Tolterodine Tartrate     REACTION: Headache    Thank you for allowing pharmacy to be a part of this patient's care.  Cindi CarbonMary M Bridie Colquhoun, PharmD, BCPS Clinical Pharmacist 07/17/2017 7:39 PM

## 2017-07-17 NOTE — Progress Notes (Signed)
[employer] [facility] [place] [facility]                                                                                                                                                                                  Patient Demographics   Yvette Thomas, is a 84 y.o. female, DOB - 06/19/1925, ZOX:[id number]  Admit date - 07/15/2017   Admitting Physician Shaune PollackQing Chen, MD  Outpatient Primary MD for the patient is Karie SchwalbeLetvak, Richard I, MD   LOS - 4  Subjective: Patient still requiring high flow oxygen more interactive today but still confused  Review of Systems:   CONSTITUTIONAL: confused Vitals:   Vitals:   07/17/17 1300 07/17/17 1400 07/17/17 1500 07/17/17 1617  BP: (!) 174/67 (!) 165/60 (!) 174/63 (!) 181/71  Pulse: 85 84 82 90  Resp: 20 19 18 20   Temp:    98.9 F (37.2 C)  TempSrc:    Axillary  SpO2: 94% 95% 96% 95%  Weight:      Height:        Wt Readings from Last 3 Encounters:  07/26/2017 155 lb 10.3 oz (70.6 kg)  07/12/17 143 lb (64.9 kg)  05/21/17 143 lb (64.9 kg)     Intake/Output Summary (Last 24 hours) at 07/17/2017 1659 Last data filed at 07/17/2017 1600 Gross per 24 hour  Intake 1508.33 ml  Output 1790 ml  Net -281.67 ml    Physical Exam:   GENERAL: Critically ill-appearing HEAD, EYES, EARS, NOSE AND THROAT: Atraumatic, normocephalic. Extraocular muscles are intact. Pupils equal and reactive to light. Sclerae anicteric. No conjunctival injection. No oro-pharyngeal erythema.  NECK: Supple. There is no jugular venous distention. No bruits, no lymphadenopathy, no thyromegaly.  HEART: Regular rate and rhythm,. No murmurs, no rubs, no clicks.  LUNGS: Clear to auscultation bilaterally. No rales or rhonchi. No wheezes.  ABDOMEN: Soft, flat, nontender, nondistended. Has good bowel sounds. No hepatosplenomegaly appreciated.  EXTREMITIES: No evidence of any cyanosis, clubbing, or peripheral edema.  +2 pedal and radial pulses bilaterally.  NEUROLOGIC:  confused  sKIN: Moist and warm with no rashes appreciated.  Psych: Not anxious, depressed LN: No inguinal LN enlargement    Antibiotics   Anti-infectives (From admission, onward)   Start     Dose/Rate Route Frequency Ordered Stop   07/17/17 0300  meropenem (MERREM) 1 g in sodium chloride 0.9 % 100 mL IVPB     1 g 200 mL/hr over 30 Minutes Intravenous Every 12 hours 07/17/17 0254     07/15/17 1200  cefTRIAXone (ROCEPHIN) 2 g in dextrose 5 % 50 mL IVPB  Status:  Discontinued     2 g 100 mL/hr over 30  Minutes Intravenous Every 24 hours 07/15/17 1048 07/17/17 0254   07/15/17 1100  cefTRIAXone (ROCEPHIN) 1 g in dextrose 5 % 50 mL IVPB  Status:  Discontinued     1 g 100 mL/hr over 30 Minutes Intravenous Every 24 hours 07/15/17 1047 07/15/17 1048   07/14/17 0600  meropenem (MERREM) 500 mg in sodium chloride 0.9 % 50 mL IVPB  Status:  Discontinued     500 mg 100 mL/hr over 30 Minutes Intravenous Every 12 hours 07/14/17 0504 07/15/17 0854   07-22-17 2200  piperacillin-tazobactam (ZOSYN) IVPB 3.375 g  Status:  Discontinued     3.375 g 12.5 mL/hr over 240 Minutes Intravenous Every 12 hours 07-22-2017 1415 07/14/17 0504   Jul 22, 2017 0900  piperacillin-tazobactam (ZOSYN) IVPB 3.375 g     3.375 g 100 mL/hr over 30 Minutes Intravenous  Once 2017-07-22 0853 22-Jul-2017 1000   22-Jul-2017 0900  vancomycin (VANCOCIN) IVPB 1000 mg/200 mL premix     1,000 mg 200 mL/hr over 60 Minutes Intravenous  Once 22-Jul-2017 0853 07-22-17 1118      Medications   Scheduled Meds: . docusate sodium  100 mg Oral BID  . mouth rinse  15 mL Mouth Rinse BID  . sodium chloride flush  10-40 mL Intracatheter Q12H   Continuous Infusions: . dextrose 50 mL/hr at 07/17/17 1600  . meropenem (MERREM) IV Stopped (07/17/17 1329)   PRN Meds:.acetaminophen **OR** acetaminophen, albuterol, hydrALAZINE, morphine injection, [DISCONTINUED] ondansetron **OR** ondansetron (ZOFRAN) IV, senna-docusate, sodium chloride flush   Data Review:    Micro Results Recent Results (from the past 240 hour(s))  Urine Culture     Status: Abnormal   Collection Time: 07/12/17 10:29 AM  Result Value Ref Range Status   Specimen Description   Final    URINE, CATHETERIZED Performed at [facility], [address], [place], [place] [address]    Special Requests   Final    NONE Performed at [facility], [address] [place], [place] [address]    Culture >=100,000 COLONIES/mL ESCHERICHIA COLI (A)  Final   Report Status 07/14/2017 FINAL  Final   Organism ID, Bacteria ESCHERICHIA COLI (A)  Final      Susceptibility   Escherichia coli - MIC*    AMPICILLIN >=32 RESISTANT Resistant     CEFAZOLIN 16 SENSITIVE Sensitive     CEFTRIAXONE <=1 SENSITIVE Sensitive     CIPROFLOXACIN <=0.25 SENSITIVE Sensitive     GENTAMICIN <=1 SENSITIVE Sensitive     IMIPENEM <=0.25 SENSITIVE Sensitive     NITROFURANTOIN 32 SENSITIVE Sensitive     TRIMETH/SULFA <=20 SENSITIVE Sensitive     AMPICILLIN/SULBACTAM >=32 RESISTANT Resistant     PIP/TAZO 8 SENSITIVE Sensitive     Extended ESBL NEGATIVE Sensitive     * >=100,000 COLONIES/mL ESCHERICHIA COLI  Blood culture (routine x 2)     Status: Abnormal   Collection Time: July 22, 2017  8:35 AM  Result Value Ref Range Status   Specimen Description   Final    BLOOD LEFT WRIST Performed at [facility], [address] [place], [place] [address]    Special Requests   Final    BOTTLES DRAWN AEROBIC AND ANAEROBIC Blood Culture adequate volume Performed at [facility], [address] [place], [place] [address]    Culture  Setup Time   Final    GRAM NEGATIVE RODS IN BOTH AEROBIC AND ANAEROBIC BOTTLES CRITICAL RESULT CALLED TO, READ BACK BY AND VERIFIED WITH: MATT MCBANE AT 0435 ON 07/14/17  RWW    Culture (A)  Final    ESCHERICHIA COLI SUSCEPTIBILITIES PERFORMED ON PREVIOUS CULTURE WITHIN THE LAST 5 DAYS. Performed at [facility], [address] [place], [place]  [address]    Report Status 07/17/2017 FINAL  Final  Blood culture (routine x 2)     Status: Abnormal   Collection Time: 08-02-17  8:35 AM  Result Value Ref Range Status   Specimen Description   Final    LEFT ANTECUBITAL Performed at [facility], [address] [place], [place] [address]    Special Requests   Final    BOTTLES DRAWN AEROBIC AND ANAEROBIC Blood Culture results may not be optimal due to an excessive volume of blood received in culture bottles Performed at [facility], [address] [place], [place] [address]    Culture  Setup Time   Final    GRAM NEGATIVE RODS CRITICAL RESULT CALLED TO, READ BACK BY AND VERIFIED WITH: MATT MCBANE AT 0435 ON 07/14/17 RWW IN BOTH AEROBIC AND ANAEROBIC BOTTLES    Culture ESCHERICHIA COLI (A)  Final   Report Status 07/17/2017 FINAL  Final   Organism ID, Bacteria ESCHERICHIA COLI  Final      Susceptibility   Escherichia coli - MIC*    AMPICILLIN >=32 RESISTANT Resistant     CEFAZOLIN >=64 RESISTANT Resistant     CEFEPIME <=1 SENSITIVE Sensitive     CEFTAZIDIME <=1 SENSITIVE Sensitive     CEFTRIAXONE <=1 SENSITIVE Sensitive     CIPROFLOXACIN <=0.25 SENSITIVE Sensitive     GENTAMICIN <=1 SENSITIVE Sensitive     IMIPENEM <=0.25 SENSITIVE Sensitive     TRIMETH/SULFA <=20 SENSITIVE Sensitive     AMPICILLIN/SULBACTAM >=32 RESISTANT Resistant     PIP/TAZO 8 SENSITIVE Sensitive     Extended ESBL NEGATIVE Sensitive     * ESCHERICHIA COLI  Blood Culture ID Panel (Reflexed)     Status: Abnormal   Collection Time: Aug 02, 2017  8:35 AM  Result Value Ref Range Status   Enterococcus species NOT DETECTED NOT DETECTED Final   Listeria monocytogenes NOT DETECTED NOT DETECTED Final   Staphylococcus species NOT DETECTED NOT DETECTED Final   Staphylococcus aureus NOT DETECTED NOT DETECTED Final   Streptococcus species NOT DETECTED NOT DETECTED Final   Streptococcus agalactiae NOT DETECTED NOT DETECTED Final   Streptococcus pneumoniae  NOT DETECTED NOT DETECTED Final   Streptococcus pyogenes NOT DETECTED NOT DETECTED Final   Acinetobacter baumannii NOT DETECTED NOT DETECTED Final   Enterobacteriaceae species DETECTED (A) NOT DETECTED Final    Comment: Enterobacteriaceae represent a large family of gram-negative bacteria, not a single organism. CRITICAL RESULT CALLED TO, READ BACK BY AND VERIFIED WITH: MATT MCBANE AT 0435 ON 07/14/17 RWW    Enterobacter cloacae complex NOT DETECTED NOT DETECTED Final   Escherichia coli DETECTED (A) NOT DETECTED Final    Comment: CRITICAL RESULT CALLED TO, READ BACK BY AND VERIFIED WITH: MATT MCBANE AT 0435 ON 07/14/17 RWW    Klebsiella oxytoca NOT DETECTED NOT DETECTED Final   Klebsiella pneumoniae NOT DETECTED NOT DETECTED Final   Proteus species NOT DETECTED NOT DETECTED Final   Serratia marcescens NOT DETECTED NOT DETECTED Final   Carbapenem resistance NOT DETECTED NOT DETECTED Final   Haemophilus influenzae NOT DETECTED NOT DETECTED Final   Neisseria meningitidis NOT DETECTED NOT DETECTED Final   Pseudomonas aeruginosa NOT DETECTED NOT DETECTED Final   Candida albicans NOT DETECTED NOT DETECTED Final   Candida glabrata NOT DETECTED  NOT DETECTED Final   Candida krusei NOT DETECTED NOT DETECTED Final   Candida parapsilosis NOT DETECTED NOT DETECTED Final   Candida tropicalis NOT DETECTED NOT DETECTED Final    Comment: Performed at [facility], [address] [place], [place] [address]  Urine culture     Status: Abnormal   Collection Time: 07/31/2017 10:31 AM  Result Value Ref Range Status   Specimen Description   Final    URINE, CATHETERIZED Performed at [facility], [address] [place], [place] [address]    Special Requests   Final    NONE Performed at [facility], [address] [place], [place] [address]    Culture >=100,000 COLONIES/mL ESCHERICHIA COLI (A)  Final   Report Status 07/15/2017 FINAL  Final   Organism ID, Bacteria  ESCHERICHIA COLI (A)  Final      Susceptibility   Escherichia coli - MIC*    AMPICILLIN >=32 RESISTANT Resistant     CEFAZOLIN 8 SENSITIVE Sensitive     CEFTRIAXONE <=1 SENSITIVE Sensitive     CIPROFLOXACIN <=0.25 SENSITIVE Sensitive     GENTAMICIN <=1 SENSITIVE Sensitive     IMIPENEM <=0.25 SENSITIVE Sensitive     NITROFURANTOIN <=16 SENSITIVE Sensitive     TRIMETH/SULFA <=20 SENSITIVE Sensitive     AMPICILLIN/SULBACTAM >=32 RESISTANT Resistant     PIP/TAZO <=4 SENSITIVE Sensitive     Extended ESBL NEGATIVE Sensitive     * >=100,000 COLONIES/mL ESCHERICHIA COLI  MRSA PCR Screening     Status: None   Collection Time: 07-31-17  6:27 PM  Result Value Ref Range Status   MRSA by PCR NEGATIVE NEGATIVE Final    Comment:        The GeneXpert MRSA Assay (FDA approved for NASAL specimens only), is one component of a comprehensive MRSA colonization surveillance program. It is not intended to diagnose MRSA infection nor to guide or monitor treatment for MRSA infections. Performed at [facility], [address] [place], [place] [address]   Culture, blood (Routine X 2) w Reflex to ID Panel     Status: None (Preliminary result)   Collection Time: 07/17/17  2:07 AM  Result Value Ref Range Status   Specimen Description BLOOD RIGHT [facility]  Final   Special Requests   Final    BOTTLES DRAWN AEROBIC AND ANAEROBIC Blood Culture adequate volume   Culture   Final    NO GROWTH < 12 HOURS Performed at [facility], [address] [place], [place] [address]    Report Status PENDING  Incomplete  Culture, blood (Routine X 2) w Reflex to ID Panel     Status: None (Preliminary result)   Collection Time: 07/17/17  2:12 AM  Result Value Ref Range Status   Specimen Description BLOOD RIGHT FORE ARM  Final   Special Requests   Final    BOTTLES DRAWN AEROBIC AND ANAEROBIC Blood Culture adequate volume   Culture   Final    NO GROWTH < 12 HOURS Performed at [facility], [address] [place], [place] [address]    Report Status PENDING  Incomplete    Radiology Reports Dg Abd 1 View  Result Date: 2017/07/31 CLINICAL DATA:  Check gastric catheter placement EXAM: ABDOMEN - 1 VIEW COMPARISON:  07/12/2017 FINDINGS: Gastric catheter is noted in the stomach. Left ureteral stent is seen in satisfactory position. Proximal left ureteral stone is again identified. Nonobstructive bowel gas pattern is noted. Right femoral central line  is noted. IMPRESSION: Gastric catheter within the stomach. Other tubes and lines as described. Stable proximal left ureteral stone. Electronically Signed   By: Alcide Clever M.D.   On: 07/19/2017 19:39   Ct Head Wo Contrast  Result Date: 07/10/2017 CLINICAL DATA:  Patient found unresponsive. EXAM: CT HEAD WITHOUT CONTRAST TECHNIQUE: Contiguous axial images were obtained from the base of the skull through the vertex without intravenous contrast. COMPARISON:  October 01, 2013 FINDINGS: Brain: No subdural, epidural, or subarachnoid hemorrhage. Cerebellum, brainstem, and basal cisterns are normal. White matter changes are stable. Ventricles and sulci are unremarkable. No mass effect or midline shift. No acute cortical ischemia or infarct. Vascular: No hyperdense vessel or unexpected calcification. Skull: Normal. Negative for fracture or focal lesion. Sinuses/Orbits: Mild mucosal thickening in the ethmoid sinuses. Paranasal sinuses, mastoid air cells, and middle ears are otherwise normal. Other: There is soft tissue swelling over the left side of the scalp which has increased since 2,015. IMPRESSION: 1. No acute intracranial abnormality identified. 2. Soft tissue thickening in the left scalp. Recommend clinical correlation. 3. Mild mucosal thickening in the ethmoid sinuses. Electronically Signed   By: Gerome Sam III M.D   On: 07/07/2017 10:40   US Renal  Result Date: 07/16/2017 CLINICAL DATA:  Left hydronephrosis due to  kidney stone. EXAM: RENAL /  URINARY TRACT ULTRASOUND COMPLETE COMPARISON:  CT abdomen pelvis 07/12/2017 FINDINGS: Right Kidney: Length: 11.0 cm. 5.5 cm right lower pole cyst. Echogenicity within normal limits. No mass or hydronephrosis visualized. Left Kidney: Length: 12.2 cm.  Negative for hydronephrosis.  No renal mass. Bladder: Distended urinary bladder with Foley catheter. Question clamps catheter versus obstruction of the catheter. IMPRESSION: Negative for hydronephrosis. Mild hydronephrosis and obstructing stone noted on recent CT Urinary bladder distended despite Foley catheter. Question obstructed or clamped Foley catheter Electronically Signed   By: Marlan Palau M.D.   On: 07/16/2017 10:55   Dg Chest Port 1 View  Result Date: 07/16/2017 CLINICAL DATA:  Respiratory failure. EXAM: PORTABLE CHEST 1 VIEW COMPARISON:  07/29/2017. FINDINGS: Interim removal of NG tube. Mediastinum and hilar structures normal. Cardiomegaly with normal pulmonary vascularity. Mild bibasilar atelectasis. Small left pleural effusion cannot be excluded. No pneumothorax. Degenerative changes thoracic spine. IMPRESSION: 1.  Interim removal of NG tube. 2.  Cardiomegaly with normal pulmonary vascularity. 3. Low lung volumes with bibasilar atelectasis. Small left pleural effusion cannot be excluded. Electronically Signed   By: Maisie Fus  Register   On: 07/16/2017 06:42   Dg Chest Port 1 View  Result Date: 07/24/2017 CLINICAL DATA:  Check endotracheal tube placement EXAM: PORTABLE CHEST 1 VIEW COMPARISON:  07/29/2017 FINDINGS: Endotracheal tube and nasogastric catheter are now seen in satisfactory position. Cardiac shadow is enlarged but stable. Right hemidiaphragm is again elevated but stable. No focal infiltrate or sizable effusion is noted. Previously seen changes in the bases were likely related incomplete aeration. IMPRESSION: Tubes and lines as described above. Improved aeration in the bases bilaterally. Electronically Signed   By: Alcide Clever M.D.   On:  07/28/2017 19:38   Dg Chest Portable 1 View  Result Date: 07/06/2017 CLINICAL DATA:  Hypoxia EXAM: PORTABLE CHEST 1 VIEW COMPARISON:  None. FINDINGS: Cardiomegaly. Elevation of the right hemidiaphragm. The left lower lobe airspace opacity, possible pneumonia. Suspect small left effusion. Right base atelectasis. IMPRESSION: Cardiomegaly. Left side effusion with left lower lobe atelectasis or pneumonia. Right base atelectasis. Electronically Signed   By: Charlett Nose M.D.   On: 07/18/2017 09:22   Ct  Renal Stone Study  Result Date: 07/12/2017 CLINICAL DATA:  Sharp left flank pain EXAM: CT ABDOMEN AND PELVIS WITHOUT CONTRAST TECHNIQUE: Multidetector CT imaging of the abdomen and pelvis was performed following the standard protocol without IV contrast. COMPARISON:  None. FINDINGS: Lower chest: Cardiomegaly.  No acute findings. Hepatobiliary: No focal hepatic abnormality. Gallbladder unremarkable. Pancreas: No focal abnormality or ductal dilatation. Spleen: No focal abnormality.  Normal size. Adrenals/Urinary Tract: Mild to moderate left hydronephrosis and perinephric stranding due to 7 mm proximal left ureteral stone. No hydronephrosis on the right. Punctate nonobstructing stones in the lower pole of the left kidney. Adrenal glands and urinary bladder unremarkable.5.6 cm low-density lesion off the lower pole of the right kidney likely reflects a cyst although this cannot be characterized without intravenous contrast. Stomach/Bowel: Sigmoid diverticulosis. No active diverticulitis. Appendix is normal. Stomach and small bowel decompressed, unremarkable. Vascular/Lymphatic: Diffuse aortic and iliac calcifications. No evidence of aneurysm or adenopathy. Reproductive: Uterus and adnexa unremarkable.  No mass. Other: No free fluid or free air. Musculoskeletal: No acute bony abnormality. IMPRESSION: 7 mm proximal left ureteral stone with mild to moderate left hydronephrosis and perinephric stranding. Left lower pole  punctate nonobstructing nephrolithiasis. Aortoiliac atherosclerosis. Sigmoid diverticulosis. Cardiomegaly. Electronically Signed   By: Charlett Nose M.D.   On: 07/12/2017 12:02     CBC Recent Labs  Lab 08-09-2017 0835 Aug 09, 2017 2039 07/14/17 0347 07/15/17 0527 07/16/17 0627 07/17/17 0016  WBC 9.7 19.1* 22.0* 28.1* 36.2* 52.8*  HGB 11.2* 11.4* 10.9* 12.1 13.4 12.5  HCT 34.2* 34.8* 33.6* 37.8 41.2 39.3  PLT 72* 56* 54* 52* 53* 58*  MCV 92.5 92.6 92.3 93.6 92.8 91.7  MCH 30.4 30.3 30.0 29.9 30.1 29.2  MCHC 32.8 32.7 32.5 31.9* 32.4 31.8*  RDW 14.0 14.2 14.4 14.9* 14.7* 14.4  LYMPHSABS 0.2*  --   --   --   --   --   MONOABS 0.4  --   --   --   --   --   EOSABS 0.1  --   --   --   --   --   BASOSABS 0.0  --   --   --   --   --     Chemistries  Recent Labs  Lab Aug 09, 2017 0835  08-09-2017 2039 07/14/17 0347 07/15/17 0527 07/16/17 0627 07/17/17 0016  NA 136  --  135 138 138 143 145  K 3.9  --  5.0 4.9 4.9 5.0 3.8  CL 106  --  107 111 109 116* 116*  CO2 22  --  20* 19* 15* 16* 16*  GLUCOSE 90  --  160* 102* 76 94 128*  BUN 34*  --  41* 45* 68* 78* 70*  CREATININE 2.19*   < > 2.32* 2.49* 2.88* 2.21* 1.60*  CALCIUM 8.3*  --  7.6* 7.2* 8.2* 8.8* 9.0  MG  --   --  1.6*  --  1.8  --  1.9  AST 64*  --  114*  --   --  20  --   ALT 31  --  62*  --   --  47  --   ALKPHOS 54  --  63  --   --  103  --   BILITOT 1.7*  --  3.0* 1.7*  --  1.2  --    < > = values in this interval not displayed.   ------------------------------------------------------------------------------------------------------------------ estimated creatinine clearance is 21.1 mL/min (A) (by C-G formula based on  SCr of 1.6 mg/dL (H)). ------------------------------------------------------------------------------------------------------------------ No results for input(s): HGBA1C in the last 72 hours. ------------------------------------------------------------------------------------------------------------------ No  results for input(s): CHOL, HDL, LDLCALC, TRIG, CHOLHDL, LDLDIRECT in the last 72 hours. ------------------------------------------------------------------------------------------------------------------ Recent Labs    07/16/17 0627  TSH 0.660   ------------------------------------------------------------------------------------------------------------------ No results for input(s): VITAMINB12, FOLATE, FERRITIN, TIBC, IRON, RETICCTPCT in the last 72 hours.  Coagulation profile Recent Labs  Lab Jul 28, 2017 1247 2017/07/28 2039  INR 1.62 1.74    No results for input(s): DDIMER in the last 72 hours.  Cardiac Enzymes Recent Labs  Lab Jul 28, 2017 2039  TROPONINI 1.07*   ------------------------------------------------------------------------------------------------------------------ Invalid input(s): POCBNP    Assessment & Plan   #1  E. coli sepsis due to UTI WBC count 52,000 now back on meropenem I will ask ID to see tomorrow   #2 acute renal failure due to nephrolithiasis with left hydronephrosis-improved Patient underwent left retrograde pyelography with stent placement Repeat ultrasound of the renal showed no obstruction  #3 acute respiratory failure postop Status post extubation requiring high flow oxygen  #4left side effusion with left lower lobe atelectasis or Possible pneumonia.  Continue antibiotics.   #5 acute metabolic encephalopathy due to above. Aspiration t and fall precaution.  #6 hypotension.  Hold hypertension medication, continue IV fluid support.  #7 thrombocytopenia.  Unclear etiology, possible due to sepsis. Follow-up CBC.  Hold heparin subcu for DVT prophylaxis, give SCD.  #8 dementia  #9 patient DNR  If no significant improvement consider comfort care     Code Status Orders  (From admission, onward)        Start     Ordered   07-28-2017 1129  Do not attempt resuscitation (DNR)  Continuous    Question Answer Comment  In the event  of cardiac or respiratory ARREST Do not call a "code blue"   In the event of cardiac or respiratory ARREST Do not perform Intubation, CPR, defibrillation or ACLS   In the event of cardiac or respiratory ARREST Use medication by any route, position, wound care, and other measures to relive pain and suffering. May use oxygen, suction and manual treatment of airway obstruction as needed for comfort.      07/28/17 1128    Code Status History    Date Active Date Inactive Code Status Order ID Comments User Context   This patient has a current code status but no historical code status.    Advance Directive Documentation     Most Recent Value  Type of Advance Directive  Healthcare Power of Attorney, Living will  Pre-existing out of facility DNR order (yellow form or pink MOST form)  No data  "MOST" Form in Place?  No data           Consults  pulm ccr  DVT Prophylaxis SCD Lab Results  Component Value Date   PLT 58 (L) 07/17/2017     Time Spent in minutes   35 minutes  Greater than 50% of time spent in care coordination and counseling patient regarding the condition and plan of care.   Auburn Bilberry M.D on 07/17/2017 at 4:59 PM  Between 7am to 6pm - Pager - [phone]  After 6pm go to [email or web address] - password [email or web address] [facility]  [facility]   Office  [phone]

## 2017-07-17 NOTE — Progress Notes (Signed)
Received call from lab to report critical WBC.  Reported same to Barbette OrMagdelene Tukov, NP.  New orders received.  Will continue to monitor.

## 2017-07-18 DIAGNOSIS — Z7189 Other specified counseling: Secondary | ICD-10-CM

## 2017-07-18 DIAGNOSIS — J9600 Acute respiratory failure, unspecified whether with hypoxia or hypercapnia: Secondary | ICD-10-CM

## 2017-07-18 DIAGNOSIS — Z0189 Encounter for other specified special examinations: Secondary | ICD-10-CM

## 2017-07-18 DIAGNOSIS — N1330 Unspecified hydronephrosis: Secondary | ICD-10-CM

## 2017-07-18 DIAGNOSIS — R0603 Acute respiratory distress: Secondary | ICD-10-CM

## 2017-07-18 DIAGNOSIS — J9690 Respiratory failure, unspecified, unspecified whether with hypoxia or hypercapnia: Secondary | ICD-10-CM

## 2017-07-18 DIAGNOSIS — Z515 Encounter for palliative care: Secondary | ICD-10-CM

## 2017-07-18 LAB — BASIC METABOLIC PANEL
ANION GAP: 9 (ref 5–15)
BUN: 38 mg/dL — ABNORMAL HIGH (ref 6–20)
CHLORIDE: 115 mmol/L — AB (ref 101–111)
CO2: 24 mmol/L (ref 22–32)
CREATININE: 0.95 mg/dL (ref 0.44–1.00)
Calcium: 9.1 mg/dL (ref 8.9–10.3)
GFR calc non Af Amer: 51 mL/min — ABNORMAL LOW (ref >60)
GFR, EST AFRICAN AMERICAN: 59 mL/min — AB (ref >60)
Glucose, Bld: 189 mg/dL — ABNORMAL HIGH (ref 65–99)
Potassium: 3.7 mmol/L (ref 3.5–5.1)
Sodium: 148 mmol/L — ABNORMAL HIGH (ref 135–145)

## 2017-07-18 LAB — CBC
HEMATOCRIT: 39.3 % (ref 35.0–47.0)
HEMOGLOBIN: 12.8 g/dL (ref 12.0–16.0)
MCH: 29.7 pg (ref 26.0–34.0)
MCHC: 32.6 g/dL (ref 32.0–36.0)
MCV: 91.3 fL (ref 80.0–100.0)
Platelets: 67 10*3/uL — ABNORMAL LOW (ref 150–440)
RBC: 4.3 MIL/uL (ref 3.80–5.20)
RDW: 14.2 % (ref 11.5–14.5)
WBC: 45 10*3/uL — AB (ref 3.6–11.0)

## 2017-07-18 LAB — BLOOD GAS, ARTERIAL
Acid-base deficit: 0.4 mmol/L (ref 0.0–2.0)
BICARBONATE: 24.8 mmol/L (ref 20.0–28.0)
Delivery systems: POSITIVE
EXPIRATORY PAP: 5
FIO2: 0.5
Inspiratory PAP: 12
O2 SAT: 95.1 %
PATIENT TEMPERATURE: 37
PH ART: 7.38 (ref 7.350–7.450)
PO2 ART: 78 mmHg — AB (ref 83.0–108.0)
pCO2 arterial: 42 mmHg (ref 32.0–48.0)

## 2017-07-18 LAB — URINE CULTURE: Culture: NO GROWTH

## 2017-07-18 LAB — PROCALCITONIN: PROCALCITONIN: 0.79 ng/mL

## 2017-07-18 MED ORDER — HYDROMORPHONE HCL 1 MG/ML IJ SOLN
1.0000 mg | INTRAMUSCULAR | Status: DC | PRN
  Administered 2017-07-18 – 2017-07-19 (×6): 1 mg via INTRAVENOUS
  Filled 2017-07-18 (×6): qty 1

## 2017-07-18 MED ORDER — POLYVINYL ALCOHOL 1.4 % OP SOLN
1.0000 [drp] | Freq: Four times a day (QID) | OPHTHALMIC | Status: DC | PRN
  Filled 2017-07-18: qty 15

## 2017-07-18 MED ORDER — LORAZEPAM 2 MG/ML IJ SOLN
0.5000 mg | INTRAMUSCULAR | Status: DC | PRN
  Administered 2017-07-18 – 2017-07-20 (×4): 0.5 mg via INTRAVENOUS
  Filled 2017-07-18 (×4): qty 1

## 2017-07-18 MED ORDER — HALOPERIDOL LACTATE 5 MG/ML IJ SOLN
1.0000 mg | Freq: Four times a day (QID) | INTRAMUSCULAR | Status: DC | PRN
  Filled 2017-07-18: qty 0.4

## 2017-07-18 MED ORDER — BIOTENE DRY MOUTH MT LIQD: 15.0000 mL | OROMUCOSAL | Status: DC | PRN

## 2017-07-18 MED ORDER — MORPHINE SULFATE (PF) 2 MG/ML IV SOLN
2.0000 mg | INTRAVENOUS | Status: DC | PRN
  Administered 2017-07-18: 2 mg via INTRAVENOUS
  Administered 2017-07-18: 4 mg via INTRAVENOUS
  Filled 2017-07-18: qty 2
  Filled 2017-07-18: qty 1

## 2017-07-18 MED ORDER — MORPHINE SULFATE (PF) 2 MG/ML IV SOLN
2.0000 mg | INTRAVENOUS | Status: AC
  Administered 2017-07-18: 2 mg via INTRAVENOUS
  Filled 2017-07-18: qty 1

## 2017-07-18 MED ORDER — LORAZEPAM 2 MG/ML IJ SOLN
2.0000 mg | Freq: Once | INTRAMUSCULAR | Status: AC
  Administered 2017-07-18: 2 mg via INTRAVENOUS
  Filled 2017-07-18: qty 1

## 2017-07-18 MED ORDER — GLYCOPYRROLATE 0.2 MG/ML IJ SOLN
0.2000 mg | INTRAMUSCULAR | Status: DC | PRN
  Filled 2017-07-18: qty 1

## 2017-07-18 NOTE — Progress Notes (Addendum)
[facility] -  at [facility]                                                                                                                                                                                  Patient Demographics   Yvette Thomas, is a 84 y.o. female, DOB - January 15, 1925, ZOX:[id number]  Admit date - Jul 31, 2017   Admitting Physician Shaune Pollack, MD  Outpatient Primary MD for the patient is Karie Schwalbe, MD   LOS - 5  Subjective: Patient is lethargic, on ventimask but still shallow breathing and in respiratory distress.   Review of Systems:   CONSTITUTIONAL: confused Vitals:   Vitals:   07/17/17 2035 07/18/17 0451 07/18/17 0532 07/18/17 0748  BP: (!) 170/59 (!) 176/62 (!) 141/56 (!) 168/61  Pulse: 100 89 88 89  Resp: 20 20  (!) 32  Temp: 98.8 F (37.1 C) 98.2 F (36.8 C)  98.2 F (36.8 C)  TempSrc: Oral Axillary  Oral  SpO2: 92% (!) 89% 93% 99%  Weight:      Height:        Wt Readings from Last 3 Encounters:  31-Jul-2017 155 lb 10.3 oz (70.6 kg)  07/12/17 143 lb (64.9 kg)  05/21/17 143 lb (64.9 kg)     Intake/Output Summary (Last 24 hours) at 07/18/2017 0801 Last data filed at 07/18/2017 0452 Gross per 24 hour  Intake 594.9 ml  Output 830 ml  Net -235.1 ml    Physical Exam:   GENERAL: Critically ill-appearing HEAD, EYES, EARS, NOSE AND THROAT: Atraumatic, normocephalic. Extraocular muscles are intact. Pupils equal and reactive to light. Sclerae anicteric. No conjunctival injection. On HF. NECK: Supple. There is no jugular venous distention. No bruits, no lymphadenopathy, no thyromegaly.  HEART: Regular rate and rhythm,. No murmurs, no rubs, no clicks.  LUNGS: diminished breath sounds bilaterally. No rales or rhonchi. No wheezes.  ABDOMEN: Soft, flat, nontender, nondistended. Has good bowel sounds. No hepatosplenomegaly appreciated.  EXTREMITIES: No evidence of any cyanosis, clubbing, or peripheral edema.  +2 pedal and radial pulses  bilaterally.  NEUROLOGIC: unable to exam.  sKIN: Moist and warm with no rashes appreciated.  Psych: lethargic. LN: No inguinal LN enlargement    Antibiotics   Anti-infectives (From admission, onward)   Start     Dose/Rate Route Frequency Ordered Stop   07/17/17 2030  vancomycin (VANCOCIN) IVPB 1000 mg/200 mL premix     1,000 mg 200 mL/hr over 60 Minutes Intravenous  Once 07/17/17 1936 07/17/17 2202   07/17/17 0300  meropenem (MERREM) 1 g in sodium chloride 0.9 % 100 mL IVPB     1 g 200 mL/hr over 30 Minutes  Intravenous Every 12 hours 07/17/17 0254     07/15/17 1200  cefTRIAXone (ROCEPHIN) 2 g in dextrose 5 % 50 mL IVPB  Status:  Discontinued     2 g 100 mL/hr over 30 Minutes Intravenous Every 24 hours 07/15/17 1048 07/17/17 0254   07/15/17 1100  cefTRIAXone (ROCEPHIN) 1 g in dextrose 5 % 50 mL IVPB  Status:  Discontinued     1 g 100 mL/hr over 30 Minutes Intravenous Every 24 hours 07/15/17 1047 07/15/17 1048   07/14/17 0600  meropenem (MERREM) 500 mg in sodium chloride 0.9 % 50 mL IVPB  Status:  Discontinued     500 mg 100 mL/hr over 30 Minutes Intravenous Every 12 hours 07/14/17 0504 07/15/17 0854   08-05-17 2200  piperacillin-tazobactam (ZOSYN) IVPB 3.375 g  Status:  Discontinued     3.375 g 12.5 mL/hr over 240 Minutes Intravenous Every 12 hours 08-05-2017 1415 07/14/17 0504   2017/08/05 0900  piperacillin-tazobactam (ZOSYN) IVPB 3.375 g     3.375 g 100 mL/hr over 30 Minutes Intravenous  Once 08/05/17 0853 Aug 05, 2017 1000   08-05-2017 0900  vancomycin (VANCOCIN) IVPB 1000 mg/200 mL premix     1,000 mg 200 mL/hr over 60 Minutes Intravenous  Once 08-05-17 0853 08-05-2017 1118      Medications   Scheduled Meds: . docusate sodium  100 mg Oral BID  . mouth rinse  15 mL Mouth Rinse BID  . sodium chloride flush  10-40 mL Intracatheter Q12H   Continuous Infusions: . dextrose 50 mL/hr at 07/18/17 0223  . meropenem (MERREM) IV Stopped (07/17/17 2245)   PRN Meds:.acetaminophen **OR**  acetaminophen, albuterol, hydrALAZINE, morphine injection, [DISCONTINUED] ondansetron **OR** ondansetron (ZOFRAN) IV, senna-docusate, sodium chloride flush   Data Review:   Micro Results Recent Results (from the past 240 hour(s))  Urine Culture     Status: Abnormal   Collection Time: 07/12/17 10:29 AM  Result Value Ref Range Status   Specimen Description   Final    URINE, CATHETERIZED Performed at [facility], [address], [place], [place] [address]    Special Requests   Final    NONE Performed at [facility], [address] [place], [place] [address]    Culture >=100,000 COLONIES/mL ESCHERICHIA COLI (A)  Final   Report Status 07/14/2017 FINAL  Final   Organism ID, Bacteria ESCHERICHIA COLI (A)  Final      Susceptibility   Escherichia coli - MIC*    AMPICILLIN >=32 RESISTANT Resistant     CEFAZOLIN 16 SENSITIVE Sensitive     CEFTRIAXONE <=1 SENSITIVE Sensitive     CIPROFLOXACIN <=0.25 SENSITIVE Sensitive     GENTAMICIN <=1 SENSITIVE Sensitive     IMIPENEM <=0.25 SENSITIVE Sensitive     NITROFURANTOIN 32 SENSITIVE Sensitive     TRIMETH/SULFA <=20 SENSITIVE Sensitive     AMPICILLIN/SULBACTAM >=32 RESISTANT Resistant     PIP/TAZO 8 SENSITIVE Sensitive     Extended ESBL NEGATIVE Sensitive     * >=100,000 COLONIES/mL ESCHERICHIA COLI  Blood culture (routine x 2)     Status: Abnormal   Collection Time: August 05, 2017  8:35 AM  Result Value Ref Range Status   Specimen Description   Final    BLOOD LEFT WRIST Performed at [facility], [address] [place], [place] [address]    Special Requests   Final    BOTTLES DRAWN AEROBIC AND ANAEROBIC Blood Culture adequate volume Performed at [facility], [address], [place], [place] [address]  Culture  Setup Time   Final    GRAM NEGATIVE RODS IN BOTH AEROBIC AND ANAEROBIC BOTTLES CRITICAL RESULT CALLED TO, READ BACK BY AND VERIFIED WITH: MATT MCBANE AT 0435 ON 07/14/17 RWW    Culture  (A)  Final    ESCHERICHIA COLI SUSCEPTIBILITIES PERFORMED ON PREVIOUS CULTURE WITHIN THE LAST 5 DAYS. Performed at [facility], [address] [place], [place] [address]    Report Status 07/17/2017 FINAL  Final  Blood culture (routine x 2)     Status: Abnormal   Collection Time: 07/23/2017  8:35 AM  Result Value Ref Range Status   Specimen Description   Final    LEFT ANTECUBITAL Performed at [facility], [address] [place], [place] [address]    Special Requests   Final    BOTTLES DRAWN AEROBIC AND ANAEROBIC Blood Culture results may not be optimal due to an excessive volume of blood received in culture bottles Performed at [facility], [address] [place], [place] [address]    Culture  Setup Time   Final    GRAM NEGATIVE RODS CRITICAL RESULT CALLED TO, READ BACK BY AND VERIFIED WITH: MATT MCBANE AT 0435 ON 07/14/17 RWW IN BOTH AEROBIC AND ANAEROBIC BOTTLES    Culture ESCHERICHIA COLI (A)  Final   Report Status 07/17/2017 FINAL  Final   Organism ID, Bacteria ESCHERICHIA COLI  Final      Susceptibility   Escherichia coli - MIC*    AMPICILLIN >=32 RESISTANT Resistant     CEFAZOLIN >=64 RESISTANT Resistant     CEFEPIME <=1 SENSITIVE Sensitive     CEFTAZIDIME <=1 SENSITIVE Sensitive     CEFTRIAXONE <=1 SENSITIVE Sensitive     CIPROFLOXACIN <=0.25 SENSITIVE Sensitive     GENTAMICIN <=1 SENSITIVE Sensitive     IMIPENEM <=0.25 SENSITIVE Sensitive     TRIMETH/SULFA <=20 SENSITIVE Sensitive     AMPICILLIN/SULBACTAM >=32 RESISTANT Resistant     PIP/TAZO 8 SENSITIVE Sensitive     Extended ESBL NEGATIVE Sensitive     * ESCHERICHIA COLI  Blood Culture ID Panel (Reflexed)     Status: Abnormal   Collection Time: 07/08/2017  8:35 AM  Result Value Ref Range Status   Enterococcus species NOT DETECTED NOT DETECTED Final   Listeria monocytogenes NOT DETECTED NOT DETECTED Final   Staphylococcus species NOT DETECTED NOT DETECTED Final   Staphylococcus aureus  NOT DETECTED NOT DETECTED Final   Streptococcus species NOT DETECTED NOT DETECTED Final   Streptococcus agalactiae NOT DETECTED NOT DETECTED Final   Streptococcus pneumoniae NOT DETECTED NOT DETECTED Final   Streptococcus pyogenes NOT DETECTED NOT DETECTED Final   Acinetobacter baumannii NOT DETECTED NOT DETECTED Final   Enterobacteriaceae species DETECTED (A) NOT DETECTED Final    Comment: Enterobacteriaceae represent a large family of gram-negative bacteria, not a single organism. CRITICAL RESULT CALLED TO, READ BACK BY AND VERIFIED WITH: MATT MCBANE AT 0435 ON 07/14/17 RWW    Enterobacter cloacae complex NOT DETECTED NOT DETECTED Final   Escherichia coli DETECTED (A) NOT DETECTED Final    Comment: CRITICAL RESULT CALLED TO, READ BACK BY AND VERIFIED WITH: MATT MCBANE AT 0435 ON 07/14/17 RWW    Klebsiella oxytoca NOT DETECTED NOT DETECTED Final   Klebsiella pneumoniae NOT DETECTED NOT DETECTED Final   Proteus species NOT DETECTED NOT DETECTED Final   Serratia marcescens NOT DETECTED NOT DETECTED Final   Carbapenem resistance NOT DETECTED NOT DETECTED Final   Haemophilus influenzae NOT DETECTED NOT  DETECTED Final   Neisseria meningitidis NOT DETECTED NOT DETECTED Final   Pseudomonas aeruginosa NOT DETECTED NOT DETECTED Final   Candida albicans NOT DETECTED NOT DETECTED Final   Candida glabrata NOT DETECTED NOT DETECTED Final   Candida krusei NOT DETECTED NOT DETECTED Final   Candida parapsilosis NOT DETECTED NOT DETECTED Final   Candida tropicalis NOT DETECTED NOT DETECTED Final    Comment: Performed at [facility], [address], [place], [place] [address]  Urine culture     Status: Abnormal   Collection Time: 07/15/2017 10:31 AM  Result Value Ref Range Status   Specimen Description   Final    URINE, CATHETERIZED Performed at [facility], [address] [place], [place] [address]    Special Requests   Final    NONE Performed at [facility], [address] [place], [place] [address]    Culture >=100,000 COLONIES/mL ESCHERICHIA COLI (A)  Final   Report Status 07/15/2017 FINAL  Final   Organism ID, Bacteria ESCHERICHIA COLI (A)  Final      Susceptibility   Escherichia coli - MIC*    AMPICILLIN >=32 RESISTANT Resistant     CEFAZOLIN 8 SENSITIVE Sensitive     CEFTRIAXONE <=1 SENSITIVE Sensitive     CIPROFLOXACIN <=0.25 SENSITIVE Sensitive     GENTAMICIN <=1 SENSITIVE Sensitive     IMIPENEM <=0.25 SENSITIVE Sensitive     NITROFURANTOIN <=16 SENSITIVE Sensitive     TRIMETH/SULFA <=20 SENSITIVE Sensitive     AMPICILLIN/SULBACTAM >=32 RESISTANT Resistant     PIP/TAZO <=4 SENSITIVE Sensitive     Extended ESBL NEGATIVE Sensitive     * >=100,000 COLONIES/mL ESCHERICHIA COLI  MRSA PCR Screening     Status: None   Collection Time: 07/31/2017  6:27 PM  Result Value Ref Range Status   MRSA by PCR NEGATIVE NEGATIVE Final    Comment:        The GeneXpert MRSA Assay (FDA approved for NASAL specimens only), is one component of a comprehensive MRSA colonization surveillance program. It is not intended to diagnose MRSA infection nor to guide or monitor treatment for MRSA infections. Performed at [facility], [address] [place], [place] [address]   Culture, blood (Routine X 2) w Reflex to ID Panel     Status: None (Preliminary result)   Collection Time: 07/17/17  2:07 AM  Result Value Ref Range Status   Specimen Description BLOOD RIGHT [facility]  Final   Special Requests   Final    BOTTLES DRAWN AEROBIC AND ANAEROBIC Blood Culture adequate volume   Culture   Final    NO GROWTH 1 DAY Performed at [facility], [address], [place], [place] [address]    Report Status PENDING  Incomplete  Culture, blood (Routine X 2) w Reflex to ID Panel     Status: None (Preliminary result)   Collection Time: 07/17/17  2:12 AM  Result Value Ref Range Status   Specimen Description BLOOD RIGHT FORE ARM  Final   Special Requests    Final    BOTTLES DRAWN AEROBIC AND ANAEROBIC Blood Culture adequate volume   Culture   Final    NO GROWTH 1 DAY Performed at [facility], [address], [place], [place] [address]    Report Status PENDING  Incomplete    Radiology Reports Dg Abd 1 View  Result Date: 08/01/2017 CLINICAL DATA:  Check gastric catheter placement EXAM: ABDOMEN - 1 VIEW COMPARISON:  07/12/2017  FINDINGS: Gastric catheter is noted in the stomach. Left ureteral stent is seen in satisfactory position. Proximal left ureteral stone is again identified. Nonobstructive bowel gas pattern is noted. Right femoral central line is noted. IMPRESSION: Gastric catheter within the stomach. Other tubes and lines as described. Stable proximal left ureteral stone. Electronically Signed   By: Alcide CleverMark  Lukens M.D.   On: 07/23/2017 19:39   Ct Head Wo Contrast  Result Date: 07/05/2017 CLINICAL DATA:  Patient found unresponsive. EXAM: CT HEAD WITHOUT CONTRAST TECHNIQUE: Contiguous axial images were obtained from the base of the skull through the vertex without intravenous contrast. COMPARISON:  October 01, 2013 FINDINGS: Brain: No subdural, epidural, or subarachnoid hemorrhage. Cerebellum, brainstem, and basal cisterns are normal. White matter changes are stable. Ventricles and sulci are unremarkable. No mass effect or midline shift. No acute cortical ischemia or infarct. Vascular: No hyperdense vessel or unexpected calcification. Skull: Normal. Negative for fracture or focal lesion. Sinuses/Orbits: Mild mucosal thickening in the ethmoid sinuses. Paranasal sinuses, mastoid air cells, and middle ears are otherwise normal. Other: There is soft tissue swelling over the left side of the scalp which has increased since 2,015. IMPRESSION: 1. No acute intracranial abnormality identified. 2. Soft tissue thickening in the left scalp. Recommend clinical correlation. 3. Mild mucosal thickening in the ethmoid sinuses. Electronically Signed   By: Gerome Samavid   Williams III M.D   On: 07/09/2017 10:40   [place] Renal  Result Date: 07/16/2017 CLINICAL DATA:  Left hydronephrosis due to  kidney stone. EXAM: RENAL / URINARY TRACT ULTRASOUND COMPLETE COMPARISON:  CT abdomen pelvis 07/12/2017 FINDINGS: Right Kidney: Length: 11.0 cm. 5.5 cm right lower pole cyst. Echogenicity within normal limits. No mass or hydronephrosis visualized. Left Kidney: Length: 12.2 cm.  Negative for hydronephrosis.  No renal mass. Bladder: Distended urinary bladder with Foley catheter. Question clamps catheter versus obstruction of the catheter. IMPRESSION: Negative for hydronephrosis. Mild hydronephrosis and obstructing stone noted on recent CT Urinary bladder distended despite Foley catheter. Question obstructed or clamped Foley catheter Electronically Signed   By: Marlan Palauharles  Clark M.D.   On: 07/16/2017 10:55   Dg Chest Port 1 View  Result Date: 07/16/2017 CLINICAL DATA:  Respiratory failure. EXAM: PORTABLE CHEST 1 VIEW COMPARISON:  07/14/2017. FINDINGS: Interim removal of NG tube. Mediastinum and hilar structures normal. Cardiomegaly with normal pulmonary vascularity. Mild bibasilar atelectasis. Small left pleural effusion cannot be excluded. No pneumothorax. Degenerative changes thoracic spine. IMPRESSION: 1.  Interim removal of NG tube. 2.  Cardiomegaly with normal pulmonary vascularity. 3. Low lung volumes with bibasilar atelectasis. Small left pleural effusion cannot be excluded. Electronically Signed   By: Maisie Fushomas  Register   On: 07/16/2017 06:42   Dg Chest Port 1 View  Result Date: 07/29/2017 CLINICAL DATA:  Check endotracheal tube placement EXAM: PORTABLE CHEST 1 VIEW COMPARISON:  07/18/2017 FINDINGS: Endotracheal tube and nasogastric catheter are now seen in satisfactory position. Cardiac shadow is enlarged but stable. Right hemidiaphragm is again elevated but stable. No focal infiltrate or sizable effusion is noted. Previously seen changes in the bases were likely related incomplete  aeration. IMPRESSION: Tubes and lines as described above. Improved aeration in the bases bilaterally. Electronically Signed   By: Alcide CleverMark  Lukens M.D.   On: 07/24/2017 19:38   Dg Chest Portable 1 View  Result Date: 07/29/2017 CLINICAL DATA:  Hypoxia EXAM: PORTABLE CHEST 1 VIEW COMPARISON:  None. FINDINGS: Cardiomegaly. Elevation of the right hemidiaphragm. The left lower lobe airspace opacity, possible pneumonia. Suspect small left effusion. Right base  atelectasis. IMPRESSION: Cardiomegaly. Left side effusion with left lower lobe atelectasis or pneumonia. Right base atelectasis. Electronically Signed   By: Charlett Nose M.D.   On: 07/17/2017 09:22   Ct Renal Stone Study  Result Date: 07/12/2017 CLINICAL DATA:  Sharp left flank pain EXAM: CT ABDOMEN AND PELVIS WITHOUT CONTRAST TECHNIQUE: Multidetector CT imaging of the abdomen and pelvis was performed following the standard protocol without IV contrast. COMPARISON:  None. FINDINGS: Lower chest: Cardiomegaly.  No acute findings. Hepatobiliary: No focal hepatic abnormality. Gallbladder unremarkable. Pancreas: No focal abnormality or ductal dilatation. Spleen: No focal abnormality.  Normal size. Adrenals/Urinary Tract: Mild to moderate left hydronephrosis and perinephric stranding due to 7 mm proximal left ureteral stone. No hydronephrosis on the right. Punctate nonobstructing stones in the lower pole of the left kidney. Adrenal glands and urinary bladder unremarkable.5.6 cm low-density lesion off the lower pole of the right kidney likely reflects a cyst although this cannot be characterized without intravenous contrast. Stomach/Bowel: Sigmoid diverticulosis. No active diverticulitis. Appendix is normal. Stomach and small bowel decompressed, unremarkable. Vascular/Lymphatic: Diffuse aortic and iliac calcifications. No evidence of aneurysm or adenopathy. Reproductive: Uterus and adnexa unremarkable.  No mass. Other: No free fluid or free air. Musculoskeletal: No  acute bony abnormality. IMPRESSION: 7 mm proximal left ureteral stone with mild to moderate left hydronephrosis and perinephric stranding. Left lower pole punctate nonobstructing nephrolithiasis. Aortoiliac atherosclerosis. Sigmoid diverticulosis. Cardiomegaly. Electronically Signed   By: Charlett Nose M.D.   On: 07/12/2017 12:02     CBC Recent Labs  Lab 07/12/2017 0835  07/14/17 0347 07/15/17 0527 07/16/17 0627 07/17/17 0016 07/18/17 0500  WBC 9.7   < > 22.0* 28.1* 36.2* 52.8* 45.0*  HGB 11.2*   < > 10.9* 12.1 13.4 12.5 12.8  HCT 34.2*   < > 33.6* 37.8 41.2 39.3 39.3  PLT 72*   < > 54* 52* 53* 58* 67*  MCV 92.5   < > 92.3 93.6 92.8 91.7 91.3  MCH 30.4   < > 30.0 29.9 30.1 29.2 29.7  MCHC 32.8   < > 32.5 31.9* 32.4 31.8* 32.6  RDW 14.0   < > 14.4 14.9* 14.7* 14.4 14.2  LYMPHSABS 0.2*  --   --   --   --   --   --   MONOABS 0.4  --   --   --   --   --   --   EOSABS 0.1  --   --   --   --   --   --   BASOSABS 0.0  --   --   --   --   --   --    < > = values in this interval not displayed.    Chemistries  Recent Labs  Lab 07/12/2017 0835  07/14/2017 2039 07/14/17 0347 07/15/17 0527 07/16/17 0627 07/17/17 0016 07/18/17 0500  NA 136  --  135 138 138 143 145 148*  K 3.9  --  5.0 4.9 4.9 5.0 3.8 3.7  CL 106  --  107 111 109 116* 116* 115*  CO2 22  --  20* 19* 15* 16* 16* 24  GLUCOSE 90  --  160* 102* 76 94 128* 189*  BUN 34*  --  41* 45* 68* 78* 70* 38*  CREATININE 2.19*   < > 2.32* 2.49* 2.88* 2.21* 1.60* 0.95  CALCIUM 8.3*  --  7.6* 7.2* 8.2* 8.8* 9.0 9.1  MG  --   --  1.6*  --  1.8  --  1.9  --   AST 64*  --  114*  --   --  20  --   --   ALT 31  --  62*  --   --  47  --   --   ALKPHOS 54  --  63  --   --  103  --   --   BILITOT 1.7*  --  3.0* 1.7*  --  1.2  --   --    < > = values in this interval not displayed.   ------------------------------------------------------------------------------------------------------------------ estimated creatinine clearance is 35.5 mL/min (by  C-G formula based on SCr of 0.95 mg/dL). ------------------------------------------------------------------------------------------------------------------ No results for input(s): HGBA1C in the last 72 hours. ------------------------------------------------------------------------------------------------------------------ No results for input(s): CHOL, HDL, LDLCALC, TRIG, CHOLHDL, LDLDIRECT in the last 72 hours. ------------------------------------------------------------------------------------------------------------------ Recent Labs    07/16/17 0627  TSH 0.660   ------------------------------------------------------------------------------------------------------------------ No results for input(s): VITAMINB12, FOLATE, FERRITIN, TIBC, IRON, RETICCTPCT in the last 72 hours.  Coagulation profile Recent Labs  Lab 2017/07/29 1247 2017-07-29 2039  INR 1.62 1.74    No results for input(s): DDIMER in the last 72 hours.  Cardiac Enzymes Recent Labs  Lab Jul 29, 2017 2039  TROPONINI 1.07*   ------------------------------------------------------------------------------------------------------------------ Invalid input(s): POCBNP    Assessment & Plan   #1  E. coli sepsis due to UTI WBC count 45,000 now back on meropenem Follow up ID.   #2 acute renal failure due to nephrolithiasis with left hydronephrosis-improved Patient underwent left retrograde pyelography with stent placement Repeat ultrasound of the renal showed no obstruction  #3 acute respiratory failure with hypoxia, worsening this am. Status post extubation and was on O2 Garrett 3 L.  Back to HF, need BIPAP and transfer to stepdown this time.  #4left side effusion with left lower lobe atelectasis or Possible pneumonia.  Continue antibiotics.   #5 acute metabolic encephalopathy due to above. Aspiration t and fall precaution.  #6 hypotension.  Hold hypertension medication, discontinue IV fluid support.  #7  thrombocytopenia.  Unclear etiology, possible due to sepsis. Follow-up CBC.  Hold heparin subcu for DVT prophylaxis, give SCD.  #8 dementia  #9 patient DNR  Discussed with Dr. Sung Amabile, who suggest the patient needs comfort care. The patient is critical and has poor prognosis. I discussed with her daughter about palliative care consult and comfort care. She will consider it.     Code Status Orders  (From admission, onward)        Start     Ordered   07/29/17 1129  Do not attempt resuscitation (DNR)  Continuous    Question Answer Comment  In the event of cardiac or respiratory ARREST Do not call a "code blue"   In the event of cardiac or respiratory ARREST Do not perform Intubation, CPR, defibrillation or ACLS   In the event of cardiac or respiratory ARREST Use medication by any route, position, wound care, and other measures to relive pain and suffering. May use oxygen, suction and manual treatment of airway obstruction as needed for comfort.      29-Jul-2017 1128    Code Status History    Date Active Date Inactive Code Status Order ID Comments User Context   This patient has a current code status but no historical code status.    Advance Directive Documentation     Most Recent Value  Type of Advance Directive  Healthcare Power of Attorney, Living will  Pre-existing out of facility DNR order (yellow form or  pink MOST form)  No data  "MOST" Form in Place?  No data           Consults  pulm ccr  DVT Prophylaxis SCD Lab Results  Component Value Date   PLT 67 (L) 07/18/2017     Critical Time Spent in minutes   46 minutes  Greater than 50% of time spent in care coordination and counseling patient regarding the condition and plan of care.   Shaune Pollack M.D on 07/18/2017 at 8:01 AM  Between 7am to 6pm - Pager - [phone]  After 6pm go to [email or web address] - password [email or web address] [facility]  [facility]   Office  [phone]

## 2017-07-18 NOTE — Progress Notes (Signed)
Urology Consult Follow Up  Subjective: Patient with very labored breathing.  Respiratory therapist and Dr. Imogene Burnhen in the room.  Patient to be placed on Bi-PAP and transferred to step down unit.  UOP good.  Light pink urine.  WBC count 45.  Creatinine 0.95.  Anti-infectives: Anti-infectives (From admission, onward)   Start     Dose/Rate Route Frequency Ordered Stop   07/17/17 2030  vancomycin (VANCOCIN) IVPB 1000 mg/200 mL premix     1,000 mg 200 mL/hr over 60 Minutes Intravenous  Once 07/17/17 1936 07/17/17 2202   07/17/17 0300  meropenem (MERREM) 1 g in sodium chloride 0.9 % 100 mL IVPB     1 g 200 mL/hr over 30 Minutes Intravenous Every 12 hours 07/17/17 0254     07/15/17 1200  cefTRIAXone (ROCEPHIN) 2 g in dextrose 5 % 50 mL IVPB  Status:  Discontinued     2 g 100 mL/hr over 30 Minutes Intravenous Every 24 hours 07/15/17 1048 07/17/17 0254   07/15/17 1100  cefTRIAXone (ROCEPHIN) 1 g in dextrose 5 % 50 mL IVPB  Status:  Discontinued     1 g 100 mL/hr over 30 Minutes Intravenous Every 24 hours 07/15/17 1047 07/15/17 1048   07/14/17 0600  meropenem (MERREM) 500 mg in sodium chloride 0.9 % 50 mL IVPB  Status:  Discontinued     500 mg 100 mL/hr over 30 Minutes Intravenous Every 12 hours 07/14/17 0504 07/15/17 0854   03/08/2018 2200  piperacillin-tazobactam (ZOSYN) IVPB 3.375 g  Status:  Discontinued     3.375 g 12.5 mL/hr over 240 Minutes Intravenous Every 12 hours 03/08/2018 1415 07/14/17 0504   03/08/2018 0900  piperacillin-tazobactam (ZOSYN) IVPB 3.375 g     3.375 g 100 mL/hr over 30 Minutes Intravenous  Once 03/08/2018 0853 03/08/2018 1000   03/08/2018 0900  vancomycin (VANCOCIN) IVPB 1000 mg/200 mL premix     1,000 mg 200 mL/hr over 60 Minutes Intravenous  Once 03/08/2018 0853 03/08/2018 1118      Current Facility-Administered Medications  Medication Dose Route Frequency Provider Last Rate Last Dose  . acetaminophen (TYLENOL) tablet 650 mg  650 mg Oral Q6H PRN Shaune Pollackhen, Qing, MD       Or  .  acetaminophen (TYLENOL) suppository 650 mg  650 mg Rectal Q6H PRN Shaune Pollackhen, Qing, MD      . albuterol (PROVENTIL) (2.5 MG/3ML) 0.083% nebulizer solution 2.5 mg  2.5 mg Nebulization Q2H PRN Shaune Pollackhen, Qing, MD   2.5 mg at 07/17/17 1614  . dextrose 5 % solution   Intravenous Continuous Merwyn KatosSimonds, David B, MD 50 mL/hr at 07/18/17 0223    . docusate sodium (COLACE) capsule 100 mg  100 mg Oral BID Shaune Pollackhen, Qing, MD   100 mg at 07/17/17 2040  . hydrALAZINE (APRESOLINE) injection 10-20 mg  10-20 mg Intravenous Q4H PRN Merwyn KatosSimonds, David B, MD   20 mg at 07/18/17 [id number]  . MEDLINE mouth rinse  15 mL Mouth Rinse BID Merwyn KatosSimonds, David B, MD   15 mL at 07/17/17 2041  . meropenem (MERREM) 1 g in sodium chloride 0.9 % 100 mL IVPB  1 g Intravenous Q12H [place] BilberryPatel, Shreyang, MD   Stopped at 07/17/17 2245  . morphine 2 MG/ML injection 1-2 mg  1-2 mg Intravenous Q2H PRN Merwyn KatosSimonds, David B, MD   2 mg at 07/17/17 0436  . ondansetron (ZOFRAN) injection 4 mg  4 mg Intravenous Q6H PRN Shaune Pollackhen, Qing, MD      . senna-docusate (Senokot-S) tablet 1 tablet  1 tablet Oral QHS PRN Shaune Pollack, MD      . sodium chloride flush (NS) 0.9 % injection 10-40 mL  10-40 mL Intracatheter Q12H Conforti, John, DO   10 mL at 07/17/17 2041  . sodium chloride flush (NS) 0.9 % injection 10-40 mL  10-40 mL Intracatheter PRN Conforti, John, DO         Objective: Vital signs in last 24 hours: Temp:  [98.2 F (36.8 C)-98.9 F (37.2 C)] 98.2 F (36.8 C) (01/17 0748) Pulse Rate:  [77-100] 89 (01/17 0748) Resp:  [17-32] 32 (01/17 0748) BP: (141-181)/(56-92) 168/61 (01/17 0748) SpO2:  [89 %-99 %] 99 % (01/17 0748)  Intake/Output from previous day: 01/16 0701 - 01/17 0700 In: 644.9 [I.V.:544.9; IV Piggyback:100] Out: 1090 [Urine:1090] Intake/Output this shift: No intake/output data recorded.   Physical Exam Constitutional: Patient in acute distress.   HEENT: [place] AT, moist mucus membranes. Trachea midline, no masses. Cardiovascular: No clubbing, cyanosis, or  edema. Respiratory: Very labored respiratory effort on Ventimask  GI: Abdomen is soft, non tender, non distended, no abdominal masses. Liver and spleen not palpable.  No hernias appreciated.  Stool sample for occult testing is not indicated.   GU: No CVA tenderness.  No bladder fullness or masses.   Skin: Lower extremities with mottled skin.   Lymph: No cervical or inguinal adenopathy. Neurologic: Grossly intact, no focal deficits, moving all 4 extremities. Psychiatric: Normal mood and affect.  Lab Results:  Recent Labs    07/17/17 0016 07/18/17 0500  WBC 52.8* 45.0*  HGB 12.5 12.8  HCT 39.3 39.3  PLT 58* 67*   BMET Recent Labs    07/17/17 0016 07/18/17 0500  NA 145 148*  K 3.8 3.7  CL 116* 115*  CO2 16* 24  GLUCOSE 128* 189*  BUN 70* 38*  CREATININE 1.60* 0.95  CALCIUM 9.0 9.1   PT/INR No results for input(s): LABPROT, INR in the last 72 hours. ABG No results for input(s): PHART, HCO3 in the last 72 hours.  Invalid input(s): PCO2, PO2  Studies/Results: US Renal  Result Date: 07/16/2017 CLINICAL DATA:  Left hydronephrosis due to  kidney stone. EXAM: RENAL / URINARY TRACT ULTRASOUND COMPLETE COMPARISON:  CT abdomen pelvis 07/12/2017 FINDINGS: Right Kidney: Length: 11.0 cm. 5.5 cm right lower pole cyst. Echogenicity within normal limits. No mass or hydronephrosis visualized. Left Kidney: Length: 12.2 cm.  Negative for hydronephrosis.  No renal mass. Bladder: Distended urinary bladder with Foley catheter. Question clamps catheter versus obstruction of the catheter. IMPRESSION: Negative for hydronephrosis. Mild hydronephrosis and obstructing stone noted on recent CT Urinary bladder distended despite Foley catheter. Question obstructed or clamped Foley catheter Electronically Signed   By: Marlan Palau M.D.   On: 07/16/2017 10:55     Assessment and Plan 1. Left ureteral calculus with sepsis  - stent placed on 07/16/2017  - RUS on 07/16/2017 with no hydro   2. ARF  -  being transferred to step down and placed on Bi-PAP  3. Patient DNR  - if no significant improvement - patient to be placed on comfort care    LOS: 5 days    [facility] 07/18/2017

## 2017-07-18 NOTE — Progress Notes (Signed)
Patient's daughter, Junius ArgyleLaine Frances, notified by phone of change in patient's breathing status overnight.  All questions answered and understanding verbalized.

## 2017-07-18 NOTE — Consult Note (Signed)
Consultation Note Date: 07/18/2017   Patient Name: Yvette Thomas  DOB: March 31, 1925  MRN: [id number]  Age / Sex: 84 y.o., female  PCP: Venia Carbon, MD Referring Physician: Demetrios Loll, MD  Reason for Consultation: Establishing goals of care, Non pain symptom management and Terminal Care  HPI/Patient Profile: 84 y.o. female  with past medical history of vertigo, hypertension, hyperlipidemia, hypothyroidism, osteoporosis, urinary incontinence, arthritis admitted on 07/08/2017 with AMS r/t left hydronephritis and UTI sepsis along with left side effusion and atelectasis/PNA. Ureteral stent placed but patient remained delirious in ICU with difficult course. She improved and was alert and more oriented yesterday and was transferred to floor from ICU. Overnight she developed severe respiratory decline and was placed on BiPAP. Palliative care requested to assist with family conversations regarding options and next steps in their [place].   Clinical Assessment and Goals of Care: I met today with at Yvette Thomas bedside with her daughter, son, sister, daughter-in-law, and best friend. Dr. Leonidas Romberg was able to join [place] and reviewed Yvette Thomas hospitalization and complications. He made recommendations to the family that he was hopeful yesterday but with this decline keeping her comfortable would be the best way we can help her.   I spoke more with family after we left and we discussed that her WBC was still incredibly high and that this infection is probably just too much for her body to fight. We discussed her good day yesterday and that she was more alert and communicative and that this is a gift to them. They all agree at this time that we should focus on keeping Yvette Thomas comfortable and proceed with transition off of BiPAP. I worked with Therapist, sports to administer morphine and proceed with transition from BiPAP to 4L nasal cannula for  comfort. Ms. Jon required multiple morphine and ativan boluses and still remained in some distress. She responded much better to dilaudid. Provided support and education regarding expectations at EOL to family at bedside. Family minister also at bedside.   Primary Decision Maker NEXT OF KIN daughter and son at bedside    SUMMARY OF RECOMMENDATIONS   - Full comfort care - D/C BIPAP, nasal cannula for comfort  Code Status/Advance Care Planning:  DNR   Symptom Management:   SOB/pain: Dilaudid 1 mg every 30 min prn. This worked much better than morphine.   Agitation: Ativan prn.   Robinul prn secretions.   Palliative Prophylaxis:   Aspiration, Delirium Protocol, Frequent Pain Assessment, Oral Care and Turn Reposition  Additional Recommendations (Limitations, Scope, Preferences):  Full Comfort Care  Psycho-social/Spiritual:   Desire for further Chaplaincy support:yes  Additional Recommendations: Grief/Bereavement Support  Prognosis:   Hours - Days  Discharge Planning: Anticipated Hospital Death      Primary Diagnoses: Present on Admission: . Sepsis ([place])   I have reviewed the medical record, interviewed the patient and family, and examined the patient. The following aspects are pertinent.  Past Medical History:  Diagnosis Date  . Allergic rhinitis, cause unspecified   . Anxiety   .  Colon polyps   . Fibrocystic breast   . GERD (gastroesophageal reflux disease)   . Hyperlipidemia   . Hypertension   . Hypothyroidism   . Osteoarthritis of multiple joints   . Osteoporosis   . Pyelitis   . Syncope and collapse    Hx  . Urinary incontinence   . Vertigo    Social History   Socioeconomic History  . Marital status: Widowed    Spouse name: None  . Number of children: 2  . Years of education: None  . Highest education level: None  Social Needs  . Financial resource strain: None  . Food insecurity - worry: None  . Food insecurity - inability: None  .  Transportation needs - medical: None  . Transportation needs - non-medical: None  Occupational History  . Occupation: RETIRED    Comment: [employer]  Tobacco Use  . Smoking status: Former Smoker    Years: 25.00    Last attempt to quit: 07/02/1968    Years since quitting: 49.0  . Smokeless tobacco: Never Used  Substance and Sexual Activity  . Alcohol use: No  . Drug use: No  . Sexual activity: None  Other Topics Concern  . None  Social History Narrative   Has living will    Daughter is health care POA.   Would accept resuscitation attempts but no prolonged artificial ventilation.   No tube feedings if cognitively unaware   Family History  Problem Relation Age of Onset  . Hypertension Sister   . Melanoma Sister   . Heart disease Mother   . Heart disease Father   . Hypertension Brother    Scheduled Meds: . mouth rinse  15 mL Mouth Rinse BID  .  morphine injection  2 mg Intravenous STAT  . sodium chloride flush  10-40 mL Intracatheter Q12H   Continuous Infusions: . meropenem (MERREM) IV Stopped (07/17/17 2245)   PRN Meds:.acetaminophen **OR** acetaminophen, albuterol, hydrALAZINE, morphine injection, [DISCONTINUED] ondansetron **OR** ondansetron (ZOFRAN) IV, sodium chloride flush Allergies  Allergen Reactions  . Buspirone Hcl     REACTION: Tinnitis, dizzy  . Oxybutynin Chloride     REACTION: Dried up  . Tolterodine Tartrate     REACTION: Headache   Review of Systems  Unable to perform ROS: Acuity of condition    Physical Exam  Constitutional: She appears well-developed. She appears toxic. She has a sickly appearance.  HENT:  Head: Normocephalic and atraumatic.  Cardiovascular: Tachycardia present.  Pulmonary/Chest: Accessory muscle usage present. Tachypnea noted. She is in respiratory distress. She has rhonchi.  Abdominal: Soft. Normal appearance.  Neurological:  Somnolent, mostly unresponsive  Nursing note and vitals reviewed.   Vital Signs: BP  (!) 168/61 (BP Location: Left Arm)   Pulse 89   Temp 98.2 F (36.8 C) (Oral)   Resp (!) 32   Ht '5\' 2"'$  (1.575 m)   Wt 70.6 kg (155 lb 10.3 oz)   SpO2 99%   BMI 28.47 kg/m  Pain Assessment: Faces   Pain Score: 2    SpO2: SpO2: 99 % O2 Device:SpO2: 99 % O2 Flow Rate: .O2 Flow Rate (L/min): 12 L/min  IO: Intake/output summary:   Intake/Output Summary (Last 24 hours) at 07/18/2017 1005 Last data filed at 07/18/2017 0800 Gross per 24 hour  Intake 594.9 ml  Output 830 ml  Net -235.1 ml    LBM: Last BM Date: (PTA) Baseline Weight: Weight: 64.9 kg (143 lb) Most recent weight: Weight: 70.6 kg (155 lb 10.3  oz)     Palliative Assessment/Data: 20%    Time Total: 0930-1145, 135 min  Greater than 50%  of this time was spent counseling and coordinating care related to the above assessment and plan.  Signed by: Vinie Sill, NP Palliative Medicine Team Pager # [phone] (M-F 8a-5p) Team Phone # [phone] (Nights/Weekends)

## 2017-07-18 NOTE — Progress Notes (Signed)
Spoke with Dr. Imogene Burnhen x2 regarding change in patient's breathing status overnight; now 93% Ventimask 12L.  Requested MD to come to floor to evaluate patient.  Received verbal orders to obtain ABGs, place on Bi-PAP and transfer to step down unit.

## 2017-07-19 DIAGNOSIS — Z515 Encounter for palliative care: Secondary | ICD-10-CM

## 2017-07-19 MED ORDER — HYDROMORPHONE HCL 1 MG/ML IJ SOLN
2.0000 mg | INTRAMUSCULAR | Status: DC | PRN
  Administered 2017-07-19 – 2017-07-20 (×13): 2 mg via INTRAVENOUS
  Filled 2017-07-19 (×13): qty 2

## 2017-07-19 NOTE — Progress Notes (Signed)
[employer] [facility]                                                                                                                                                                                  Patient Demographics   Yvette Thomas, is a 84 y.o. female, DOB - 1924-09-21, [id number]  Admit date - 09-19-2017   Admitting Physician Yvette PollackQing Philana Younis, MD  Outpatient Primary MD for the patient is Yvette Thomas, Yvette I, MD   LOS - 6  Subjective:  The patient is on oxygen [place], gagging respiration, on comfort care.  Review of Systems:    Unable to obtain. Vitals:   Vitals:   07/18/17 0748 07/18/17 0800 07/18/17 2025 07/19/17 0226  BP: (!) 168/61   (!) 158/55  Pulse: 89 88 83 86  Resp: (!) 32 (!) 36 (!) 28 (!) 28  Temp: 98.2 F (36.8 C)   98.9 F (37.2 C)  TempSrc: Oral   Oral  SpO2: 99% 95% 90% 91%  Weight:      Height:        Wt Readings from Last 3 Encounters:  10-Nov-2017 155 lb 10.3 oz (70.6 kg)  07/12/17 143 lb (64.9 kg)  05/21/17 143 lb (64.9 kg)     Intake/Output Summary (Last 24 hours) at 07/19/2017 1653 Last data filed at 07/19/2017 0606 Gross per 24 hour  Intake 0 ml  Output 800 ml  Net -800 ml    Physical Exam:   GENERAL: Critically ill-appearing Psych: Unresponsive. Antibiotics   Anti-infectives (From admission, onward)   Start     Dose/Rate Route Frequency Ordered Stop   07/17/17 2030  vancomycin (VANCOCIN) IVPB 1000 mg/200 mL premix     1,000 mg 200 mL/hr over 60 Minutes Intravenous  Once 07/17/17 1936 07/17/17 2202   07/17/17 0300  meropenem (MERREM) 1 g in sodium chloride 0.9 % 100 mL IVPB  Status:  Discontinued     1 g 200 mL/hr over 30 Minutes Intravenous Every 12 hours 07/17/17 0254 07/18/17 1014   07/15/17 1200  cefTRIAXone (ROCEPHIN) 2 g in dextrose 5 % 50 mL IVPB  Status:  Discontinued     2 g 100 mL/hr over 30 Minutes Intravenous Every 24 hours 07/15/17 1048 07/17/17 0254   07/15/17 1100  cefTRIAXone (ROCEPHIN) 1 g in  dextrose 5 % 50 mL IVPB  Status:  Discontinued     1 g 100 mL/hr over 30 Minutes Intravenous Every 24 hours 07/15/17 1047 07/15/17 1048   07/14/17 0600  meropenem (MERREM) 500 mg in sodium chloride 0.9 % 50 mL IVPB  Status:  Discontinued     500 mg 100 mL/hr over  30 Minutes Intravenous Every 12 hours 07/14/17 0504 07/15/17 0854   07/31/2017 2200  piperacillin-tazobactam (ZOSYN) IVPB 3.375 g  Status:  Discontinued     3.375 g 12.5 mL/hr over 240 Minutes Intravenous Every 12 hours 07/02/2017 1415 07/14/17 0504   07/17/2017 0900  piperacillin-tazobactam (ZOSYN) IVPB 3.375 g     3.375 g 100 mL/hr over 30 Minutes Intravenous  Once 07/10/2017 0853 07/30/2017 1000   07/23/2017 0900  vancomycin (VANCOCIN) IVPB 1000 mg/200 mL premix     1,000 mg 200 mL/hr over 60 Minutes Intravenous  Once 07/31/2017 0853 07/30/2017 1118      Medications   Scheduled Meds: . mouth rinse  15 mL Mouth Rinse BID  . sodium chloride flush  10-40 mL Intracatheter Q12H   Continuous Infusions:  PRN Meds:.acetaminophen **OR** acetaminophen, albuterol, antiseptic oral rinse, glycopyrrolate, haloperidol lactate, hydrALAZINE, HYDROmorphone (DILAUDID) injection, LORazepam, [DISCONTINUED] ondansetron **OR** ondansetron (ZOFRAN) IV, polyvinyl alcohol, sodium chloride flush   Data Review:   Micro Results Recent Results (from the past 240 hour(s))  Urine Culture     Status: Abnormal   Collection Time: 07/12/17 10:29 AM  Result Value Ref Range Status   Specimen Description   Final    URINE, CATHETERIZED Performed at [facility], [address] [place], [place] [address]    Special Requests   Final    NONE Performed at [facility], [address] [place], [place] [address]    Culture >=100,000 COLONIES/mL ESCHERICHIA COLI (A)  Final   Report Status 07/14/2017 FINAL  Final   Organism ID, Bacteria ESCHERICHIA COLI (A)  Final      Susceptibility   Escherichia coli - MIC*    AMPICILLIN >=32 RESISTANT Resistant      CEFAZOLIN 16 SENSITIVE Sensitive     CEFTRIAXONE <=1 SENSITIVE Sensitive     CIPROFLOXACIN <=0.25 SENSITIVE Sensitive     GENTAMICIN <=1 SENSITIVE Sensitive     IMIPENEM <=0.25 SENSITIVE Sensitive     NITROFURANTOIN 32 SENSITIVE Sensitive     TRIMETH/SULFA <=20 SENSITIVE Sensitive     AMPICILLIN/SULBACTAM >=32 RESISTANT Resistant     PIP/TAZO 8 SENSITIVE Sensitive     Extended ESBL NEGATIVE Sensitive     * >=100,000 COLONIES/mL ESCHERICHIA COLI  Blood culture (routine x 2)     Status: Abnormal   Collection Time: 07/29/2017  8:35 AM  Result Value Ref Range Status   Specimen Description   Final    BLOOD LEFT WRIST Performed at [facility], [address], [place], [place] [address]    Special Requests   Final    BOTTLES DRAWN AEROBIC AND ANAEROBIC Blood Culture adequate volume Performed at [facility], [address] [place], [place] [address]    Culture  Setup Time   Final    GRAM NEGATIVE RODS IN BOTH AEROBIC AND ANAEROBIC BOTTLES CRITICAL RESULT CALLED TO, READ BACK BY AND VERIFIED WITH: MATT MCBANE AT 0435 ON 07/14/17 RWW    Culture (A)  Final    ESCHERICHIA COLI SUSCEPTIBILITIES PERFORMED ON PREVIOUS CULTURE WITHIN THE LAST 5 DAYS. Performed at [facility], [address], [place], [place] [address]    Report Status 07/17/2017 FINAL  Final  Blood culture (routine x 2)     Status: Abnormal   Collection Time: 07/19/2017  8:35 AM  Result Value Ref Range Status   Specimen Description   Final    LEFT ANTECUBITAL Performed at [facility], [address], [place], [place]  [address]    Special Requests   Final    BOTTLES DRAWN AEROBIC AND ANAEROBIC Blood Culture results may not be optimal due to an excessive volume of blood received in culture bottles Performed at [facility], [address] [place], [place] [address]    Culture  Setup Time   Final    GRAM NEGATIVE RODS CRITICAL RESULT CALLED TO, READ BACK BY AND VERIFIED  WITH: MATT MCBANE AT 0435 ON 07/14/17 RWW IN BOTH AEROBIC AND ANAEROBIC BOTTLES    Culture ESCHERICHIA COLI (A)  Final   Report Status 07/17/2017 FINAL  Final   Organism ID, Bacteria ESCHERICHIA COLI  Final      Susceptibility   Escherichia coli - MIC*    AMPICILLIN >=32 RESISTANT Resistant     CEFAZOLIN >=64 RESISTANT Resistant     CEFEPIME <=1 SENSITIVE Sensitive     CEFTAZIDIME <=1 SENSITIVE Sensitive     CEFTRIAXONE <=1 SENSITIVE Sensitive     CIPROFLOXACIN <=0.25 SENSITIVE Sensitive     GENTAMICIN <=1 SENSITIVE Sensitive     IMIPENEM <=0.25 SENSITIVE Sensitive     TRIMETH/SULFA <=20 SENSITIVE Sensitive     AMPICILLIN/SULBACTAM >=32 RESISTANT Resistant     PIP/TAZO 8 SENSITIVE Sensitive     Extended ESBL NEGATIVE Sensitive     * ESCHERICHIA COLI  Blood Culture ID Panel (Reflexed)     Status: Abnormal   Collection Time: 2017-07-24  8:35 AM  Result Value Ref Range Status   Enterococcus species NOT DETECTED NOT DETECTED Final   Listeria monocytogenes NOT DETECTED NOT DETECTED Final   Staphylococcus species NOT DETECTED NOT DETECTED Final   Staphylococcus aureus NOT DETECTED NOT DETECTED Final   Streptococcus species NOT DETECTED NOT DETECTED Final   Streptococcus agalactiae NOT DETECTED NOT DETECTED Final   Streptococcus pneumoniae NOT DETECTED NOT DETECTED Final   Streptococcus pyogenes NOT DETECTED NOT DETECTED Final   Acinetobacter baumannii NOT DETECTED NOT DETECTED Final   Enterobacteriaceae species DETECTED (A) NOT DETECTED Final    Comment: Enterobacteriaceae represent a large family of gram-negative bacteria, not a single organism. CRITICAL RESULT CALLED TO, READ BACK BY AND VERIFIED WITH: MATT MCBANE AT 0435 ON 07/14/17 RWW    Enterobacter cloacae complex NOT DETECTED NOT DETECTED Final   Escherichia coli DETECTED (A) NOT DETECTED Final    Comment: CRITICAL RESULT CALLED TO, READ BACK BY AND VERIFIED WITH: MATT MCBANE AT 0435 ON 07/14/17 RWW    Klebsiella oxytoca NOT  DETECTED NOT DETECTED Final   Klebsiella pneumoniae NOT DETECTED NOT DETECTED Final   Proteus species NOT DETECTED NOT DETECTED Final   Serratia marcescens NOT DETECTED NOT DETECTED Final   Carbapenem resistance NOT DETECTED NOT DETECTED Final   Haemophilus influenzae NOT DETECTED NOT DETECTED Final   Neisseria meningitidis NOT DETECTED NOT DETECTED Final   Pseudomonas aeruginosa NOT DETECTED NOT DETECTED Final   Candida albicans NOT DETECTED NOT DETECTED Final   Candida glabrata NOT DETECTED NOT DETECTED Final   Candida krusei NOT DETECTED NOT DETECTED Final   Candida parapsilosis NOT DETECTED NOT DETECTED Final   Candida tropicalis NOT DETECTED NOT DETECTED Final    Comment: Performed at [facility], [address], [place], [place] [address]  Urine culture     Status: Abnormal   Collection Time: 07-24-2017 10:31 AM  Result Value Ref Range Status   Specimen Description   Final    URINE, CATHETERIZED Performed at [facility], [address], [place], [place] [address]  Special Requests   Final    NONE Performed at [facility], [address] [place], [place] [address]    Culture >=100,000 COLONIES/mL ESCHERICHIA COLI (A)  Final   Report Status 07/15/2017 FINAL  Final   Organism ID, Bacteria ESCHERICHIA COLI (A)  Final      Susceptibility   Escherichia coli - MIC*    AMPICILLIN >=32 RESISTANT Resistant     CEFAZOLIN 8 SENSITIVE Sensitive     CEFTRIAXONE <=1 SENSITIVE Sensitive     CIPROFLOXACIN <=0.25 SENSITIVE Sensitive     GENTAMICIN <=1 SENSITIVE Sensitive     IMIPENEM <=0.25 SENSITIVE Sensitive     NITROFURANTOIN <=16 SENSITIVE Sensitive     TRIMETH/SULFA <=20 SENSITIVE Sensitive     AMPICILLIN/SULBACTAM >=32 RESISTANT Resistant     PIP/TAZO <=4 SENSITIVE Sensitive     Extended ESBL NEGATIVE Sensitive     * >=100,000 COLONIES/mL ESCHERICHIA COLI  MRSA PCR Screening     Status: None   Collection Time: 07/15/2017  6:27 PM  Result Value Ref  Range Status   MRSA by PCR NEGATIVE NEGATIVE Final    Comment:        The GeneXpert MRSA Assay (FDA approved for NASAL specimens only), is one component of a comprehensive MRSA colonization surveillance program. It is not intended to diagnose MRSA infection nor to guide or monitor treatment for MRSA infections. Performed at [facility], [address], [place], [place] [address]   Urine Culture     Status: None   Collection Time: 07/17/17  2:06 AM  Result Value Ref Range Status   Specimen Description   Final    URINE, RANDOM Performed at [facility], [address] [place], [place] [address]    Special Requests   Final    NONE Performed at [facility], [address] [place], [place] [address]    Culture   Final    NO GROWTH Performed at [facility], [address] [place], [place] [address]    Report Status 07/18/2017 FINAL  Final  Culture, blood (Routine X 2) w Reflex to ID Panel     Status: None (Preliminary result)   Collection Time: 07/17/17  2:07 AM  Result Value Ref Range Status   Specimen Description BLOOD RIGHT [facility]  Final   Special Requests   Final    BOTTLES DRAWN AEROBIC AND ANAEROBIC Blood Culture adequate volume   Culture   Final    NO GROWTH 2 DAYS Performed at [facility], [address] [place], [place] [address]    Report Status PENDING  Incomplete  Culture, blood (Routine X 2) w Reflex to ID Panel     Status: None (Preliminary result)   Collection Time: 07/17/17  2:12 AM  Result Value Ref Range Status   Specimen Description BLOOD RIGHT FORE ARM  Final   Special Requests   Final    BOTTLES DRAWN AEROBIC AND ANAEROBIC Blood Culture adequate volume   Culture   Final    NO GROWTH 2 DAYS Performed at [facility], [address], [place], [place] [address]    Report Status PENDING  Incomplete    Radiology Reports Dg Abd 1 View  Result Date: 07/23/2017 CLINICAL DATA:  Check gastric  catheter placement EXAM: ABDOMEN - 1 VIEW COMPARISON:  07/12/2017 FINDINGS: Gastric catheter is noted in the stomach. Left ureteral stent is seen in satisfactory position. Proximal left ureteral stone is again identified. Nonobstructive bowel  gas pattern is noted. Right femoral central line is noted. IMPRESSION: Gastric catheter within the stomach. Other tubes and lines as described. Stable proximal left ureteral stone. Electronically Signed   By: Alcide Clever M.D.   On: 07/03/2017 19:39   Ct Head Wo Contrast  Result Date: 07/22/2017 CLINICAL DATA:  Patient found unresponsive. EXAM: CT HEAD WITHOUT CONTRAST TECHNIQUE: Contiguous axial images were obtained from the base of the skull through the vertex without intravenous contrast. COMPARISON:  October 01, 2013 FINDINGS: Brain: No subdural, epidural, or subarachnoid hemorrhage. Cerebellum, brainstem, and basal cisterns are normal. White matter changes are stable. Ventricles and sulci are unremarkable. No mass effect or midline shift. No acute cortical ischemia or infarct. Vascular: No hyperdense vessel or unexpected calcification. Skull: Normal. Negative for fracture or focal lesion. Sinuses/Orbits: Mild mucosal thickening in the ethmoid sinuses. Paranasal sinuses, mastoid air cells, and middle ears are otherwise normal. Other: There is soft tissue swelling over the left side of the scalp which has increased since 2,015. IMPRESSION: 1. No acute intracranial abnormality identified. 2. Soft tissue thickening in the left scalp. Recommend clinical correlation. 3. Mild mucosal thickening in the ethmoid sinuses. Electronically Signed   By: Gerome Sam III M.D   On: 07/29/2017 10:40   US Renal  Result Date: 07/16/2017 CLINICAL DATA:  Left hydronephrosis due to  kidney stone. EXAM: RENAL / URINARY TRACT ULTRASOUND COMPLETE COMPARISON:  CT abdomen pelvis 07/12/2017 FINDINGS: Right Kidney: Length: 11.0 cm. 5.5 cm right lower pole cyst. Echogenicity within normal  limits. No mass or hydronephrosis visualized. Left Kidney: Length: 12.2 cm.  Negative for hydronephrosis.  No renal mass. Bladder: Distended urinary bladder with Foley catheter. Question clamps catheter versus obstruction of the catheter. IMPRESSION: Negative for hydronephrosis. Mild hydronephrosis and obstructing stone noted on recent CT Urinary bladder distended despite Foley catheter. Question obstructed or clamped Foley catheter Electronically Signed   By: Marlan Palau M.D.   On: 07/16/2017 10:55   Dg Chest Port 1 View  Result Date: 07/16/2017 CLINICAL DATA:  Respiratory failure. EXAM: PORTABLE CHEST 1 VIEW COMPARISON:  07/15/2017. FINDINGS: Interim removal of NG tube. Mediastinum and hilar structures normal. Cardiomegaly with normal pulmonary vascularity. Mild bibasilar atelectasis. Small left pleural effusion cannot be excluded. No pneumothorax. Degenerative changes thoracic spine. IMPRESSION: 1.  Interim removal of NG tube. 2.  Cardiomegaly with normal pulmonary vascularity. 3. Low lung volumes with bibasilar atelectasis. Small left pleural effusion cannot be excluded. Electronically Signed   By: Maisie Fus  Register   On: 07/16/2017 06:42   Dg Chest Port 1 View  Result Date: 07/23/2017 CLINICAL DATA:  Check endotracheal tube placement EXAM: PORTABLE CHEST 1 VIEW COMPARISON:  07/23/2017 FINDINGS: Endotracheal tube and nasogastric catheter are now seen in satisfactory position. Cardiac shadow is enlarged but stable. Right hemidiaphragm is again elevated but stable. No focal infiltrate or sizable effusion is noted. Previously seen changes in the bases were likely related incomplete aeration. IMPRESSION: Tubes and lines as described above. Improved aeration in the bases bilaterally. Electronically Signed   By: Alcide Clever M.D.   On: 07/07/2017 19:38   Dg Chest Portable 1 View  Result Date: 07/31/2017 CLINICAL DATA:  Hypoxia EXAM: PORTABLE CHEST 1 VIEW COMPARISON:  None. FINDINGS: Cardiomegaly.  Elevation of the right hemidiaphragm. The left lower lobe airspace opacity, possible pneumonia. Suspect small left effusion. Right base atelectasis. IMPRESSION: Cardiomegaly. Left side effusion with left lower lobe atelectasis or pneumonia. Right base atelectasis. Electronically Signed   By: Charlett Nose M.D.  On: Aug 03, 2017 09:22   Ct Renal Stone Study  Result Date: 07/12/2017 CLINICAL DATA:  Sharp left flank pain EXAM: CT ABDOMEN AND PELVIS WITHOUT CONTRAST TECHNIQUE: Multidetector CT imaging of the abdomen and pelvis was performed following the standard protocol without IV contrast. COMPARISON:  None. FINDINGS: Lower chest: Cardiomegaly.  No acute findings. Hepatobiliary: No focal hepatic abnormality. Gallbladder unremarkable. Pancreas: No focal abnormality or ductal dilatation. Spleen: No focal abnormality.  Normal size. Adrenals/Urinary Tract: Mild to moderate left hydronephrosis and perinephric stranding due to 7 mm proximal left ureteral stone. No hydronephrosis on the right. Punctate nonobstructing stones in the lower pole of the left kidney. Adrenal glands and urinary bladder unremarkable.5.6 cm low-density lesion off the lower pole of the right kidney likely reflects a cyst although this cannot be characterized without intravenous contrast. Stomach/Bowel: Sigmoid diverticulosis. No active diverticulitis. Appendix is normal. Stomach and small bowel decompressed, unremarkable. Vascular/Lymphatic: Diffuse aortic and iliac calcifications. No evidence of aneurysm or adenopathy. Reproductive: Uterus and adnexa unremarkable.  No mass. Other: No free fluid or free air. Musculoskeletal: No acute bony abnormality. IMPRESSION: 7 mm proximal left ureteral stone with mild to moderate left hydronephrosis and perinephric stranding. Left lower pole punctate nonobstructing nephrolithiasis. Aortoiliac atherosclerosis. Sigmoid diverticulosis. Cardiomegaly. Electronically Signed   By: Charlett Nose M.D.   On: 07/12/2017  12:02     CBC Recent Labs  Lab 08/03/17 0835  07/14/17 0347 07/15/17 0527 07/16/17 0627 07/17/17 0016 07/18/17 0500  WBC 9.7   < > 22.0* 28.1* 36.2* 52.8* 45.0*  HGB 11.2*   < > 10.9* 12.1 13.4 12.5 12.8  HCT 34.2*   < > 33.6* 37.8 41.2 39.3 39.3  PLT 72*   < > 54* 52* 53* 58* 67*  MCV 92.5   < > 92.3 93.6 92.8 91.7 91.3  MCH 30.4   < > 30.0 29.9 30.1 29.2 29.7  MCHC 32.8   < > 32.5 31.9* 32.4 31.8* 32.6  RDW 14.0   < > 14.4 14.9* 14.7* 14.4 14.2  LYMPHSABS 0.2*  --   --   --   --   --   --   MONOABS 0.4  --   --   --   --   --   --   EOSABS 0.1  --   --   --   --   --   --   BASOSABS 0.0  --   --   --   --   --   --    < > = values in this interval not displayed.    Chemistries  Recent Labs  Lab 08/03/17 0835  03-Aug-2017 2039 07/14/17 0347 07/15/17 0527 07/16/17 0627 07/17/17 0016 07/18/17 0500  NA 136  --  135 138 138 143 145 148*  K 3.9  --  5.0 4.9 4.9 5.0 3.8 3.7  CL 106  --  107 111 109 116* 116* 115*  CO2 22  --  20* 19* 15* 16* 16* 24  GLUCOSE 90  --  160* 102* 76 94 128* 189*  BUN 34*  --  41* 45* 68* 78* 70* 38*  CREATININE 2.19*   < > 2.32* 2.49* 2.88* 2.21* 1.60* 0.95  CALCIUM 8.3*  --  7.6* 7.2* 8.2* 8.8* 9.0 9.1  MG  --   --  1.6*  --  1.8  --  1.9  --   AST 64*  --  114*  --   --  20  --   --  ALT 31  --  62*  --   --  47  --   --   ALKPHOS 54  --  63  --   --  103  --   --   BILITOT 1.7*  --  3.0* 1.7*  --  1.2  --   --    < > = values in this interval not displayed.   ------------------------------------------------------------------------------------------------------------------ estimated creatinine clearance is 35.5 mL/min (by C-G formula based on SCr of 0.95 mg/dL). ------------------------------------------------------------------------------------------------------------------ No results for input(s): HGBA1C in the last 72  hours. ------------------------------------------------------------------------------------------------------------------ No results for input(s): CHOL, HDL, LDLCALC, TRIG, CHOLHDL, LDLDIRECT in the last 72 hours. ------------------------------------------------------------------------------------------------------------------ No results for input(s): TSH, T4TOTAL, T3FREE, THYROIDAB in the last 72 hours.  Invalid input(s): FREET3 ------------------------------------------------------------------------------------------------------------------ No results for input(s): VITAMINB12, FOLATE, FERRITIN, TIBC, IRON, RETICCTPCT in the last 72 hours.  Coagulation profile Recent Labs  Lab 08/03/17 1247 08/03/2017 2039  INR 1.62 1.74    No results for input(s): DDIMER in the last 72 hours.  Cardiac Enzymes Recent Labs  Lab 2017/08/03 2039  TROPONINI 1.07*   ------------------------------------------------------------------------------------------------------------------ Invalid input(s): POCBNP    Assessment & Plan   #1  E. coli sepsis due to UTI WBC count 45,000, she was on meropenem  #2 acute renal failure due to nephrolithiasis with left hydronephrosis-improved Patient underwent left retrograde pyelography with stent placement Repeat ultrasound of the renal showed no obstruction  #3 acute respiratory failure with hypoxia Status post extubation and was on O2 [place] 3 L.  She developed worsening respiratory failure, on comfort care now.  #4left side effusion with left lower lobe atelectasis or Possible pneumonia.  off antibiotics.  #5 acute metabolic encephalopathy due to above.  #6 hypotension.  Hold hypertension medication, discontinued IV fluid support.  #7 thrombocytopenia.  Unclear etiology, possible due to sepsis.  #8 dementia  #9 patient DNR  The patient is on comfort care and may expire any time. I discussed with her daughter and the son and sister.     Code  Status Orders  (From admission, onward)        Start     Ordered   08-03-17 1129  Do not attempt resuscitation (DNR)  Continuous    Question Answer Comment  In the event of cardiac or respiratory ARREST Do not call a "code blue"   In the event of cardiac or respiratory ARREST Do not perform Intubation, CPR, defibrillation or ACLS   In the event of cardiac or respiratory ARREST Use medication by any route, position, wound care, and other measures to relive pain and suffering. May use oxygen, suction and manual treatment of airway obstruction as needed for comfort.      2017-08-03 1128    Code Status History    Date Active Date Inactive Code Status Order ID Comments User Context   This patient has a current code status but no historical code status.    Advance Directive Documentation     Most Recent Value  Type of Advance Directive  Healthcare Power of Attorney, Living will  Pre-existing out of facility DNR order (yellow form or pink MOST form)  No data  "MOST" Form in Place?  No data           Consults  pulm ccr  DVT Prophylaxis SCD Lab Results  Component Value Date   PLT 67 (L) 07/18/2017     Critical Time Spent in minutes   26 minutes  Greater than 50% of time spent  in care coordination and counseling patient regarding the condition and plan of care.   Yvette Thomas M.D on 07/19/2017 at 4:53 PM  Between 7am to 6pm - Pager - [phone]  After 6pm go to [email or web address] - password [email or web address] [facility]  [facility]   Office  [phone]

## 2017-07-19 NOTE — Progress Notes (Signed)
Daily Progress Note   Patient Name: Yvette Thomas       Date: 07/19/2017 DOB: 1924/12/05  Age: 84 y.o. MRN#: [id number] Attending Physician: Yvette Pollack, MD Primary Care Physician: Yvette Schwalbe, MD Admit Date: 07/03/2017  Reason for Consultation/Follow-up: Establishing goals of care and Terminal Care  Subjective: Yvette Thomas continues to be progressing at EOL.   Length of Stay: 6  Current Medications: Scheduled Meds:  . mouth rinse  15 mL Mouth Rinse BID  . sodium chloride flush  10-40 mL Intracatheter Q12H    Continuous Infusions:   PRN Meds: acetaminophen **OR** acetaminophen, albuterol, antiseptic oral rinse, glycopyrrolate, haloperidol lactate, hydrALAZINE, HYDROmorphone (DILAUDID) injection, LORazepam, [DISCONTINUED] ondansetron **OR** ondansetron (ZOFRAN) IV, polyvinyl alcohol, sodium chloride flush  Physical Exam    Constitutional: She appears well-developed. She appears toxic. She has a sickly appearance.  HENT:  Head: Normocephalic and atraumatic.  Cardiovascular: Tachycardia present.  Pulmonary/Chest: Agonal  Abdominal: Soft. Normal appearance.  Neurological:  Unresponsive  Nursing note and vitals reviewed.   Vital Signs: BP (!) 158/55 (BP Location: Left Arm)   Pulse 86   Temp 98.9 F (37.2 C) (Oral)   Resp (!) 28   Ht 5\' 2"  (1.575 m)   Wt 70.6 kg (155 lb 10.3 oz)   SpO2 91%   BMI 28.47 kg/m  SpO2: SpO2: 91 % O2 Device: O2 Device: Nasal Cannula O2 Flow Rate: O2 Flow Rate (L/min): 4 L/min  Intake/output summary:   Intake/Output Summary (Last 24 hours) at 07/19/2017 4098 Last data filed at 07/19/2017 0606 Gross per 24 hour  Intake 0 ml  Output 800 ml  Net -800 ml   LBM: Last BM Date: (PTA) Baseline Weight: Weight: 64.9 kg (143 lb) Most recent  weight: Weight: 70.6 kg (155 lb 10.3 oz)       Palliative Assessment/Data: 10%     Patient Active Problem List   Diagnosis Date Noted  . Respiratory failure (HCC)   . Respiratory distress   . Palliative care encounter   . Sepsis (HCC) 07/16/2017  . Epistaxis 07/10/2016  . Kyphoscoliosis 11/16/2015  . Advanced directives, counseling/discussion 04/27/2014  . Generalized anxiety disorder 02/19/2014  . Abnormal EKG 10/02/2013  . Hyperlipidemia 10/02/2013  . Vertigo 01/13/2013  . Routine general medical examination at a health care facility 10/13/2012  .  Episodic mood disorder (HCC)   . Allergic rhinitis, cause unspecified   . Osteoarthritis of multiple joints   . URINARY INCONTINENCE 04/02/2007  . Essential hypertension 03/17/2007  . GERD 03/17/2007  . FIBROCYSTIC BREAST DISEASE 03/17/2007  . OSTEOPOROSIS 03/17/2007  . COLONIC POLYPS, HX OF 03/17/2007    Palliative Care Assessment & Plan   HPI: 84 y.o. female  with past medical history of vertigo, hypertension, hyperlipidemia, hypothyroidism,osteoporosis, urinary incontinence, arthritis admitted on 07/25/2017 with AMS r/t left hydronephritis and UTI sepsis along with left side effusion and atelectasis/PNA. Ureteral stent placed but patient remained delirious in ICU with difficult course. She improved and was alert and more oriented yesterday and was transferred to floor from ICU. Overnight she developed severe respiratory decline and was placed on BiPAP. Palliative care requested to assist with family conversations regarding options and next steps in their GOC.    Assessment: Discussed comfort and symptom management with RN. Discussed progression at EOL with family at bedside. Provided emotional support. Yvette Thomas could pass at anytime now.   Recommendations/Plan:  SOB/pain: Dilaudid increased to 2 mg every 30 min prn. This worked much better than morphine. Yvette Thomas was still having increasing WOB after 1 mg dilaudid so I  increased dilaudid.   Agitation: Ativan prn.   Robinul prn secretions.    Goals of Care and Additional Recommendations:  Limitations on Scope of Treatment: Full Comfort Care  Code Status:  DNR  Prognosis:   Hours - Days  Discharge Planning:  Anticipated Hospital Death  Care plan was discussed with RN, Dr. Imogene Burnhen, family.   Thank you for allowing the Palliative Medicine Team to assist in the care of this patient.   Total Time 25 min Prolonged Time Billed  no       Greater than 50%  of this time was spent counseling and coordinating care related to the above assessment and plan.  Yvette ChannelAlicia Courtland Coppa, NP Palliative Medicine Team Pager # [phone] (M-F 8a-5p) Team Phone # [phone] (Nights/Weekends)

## 2017-07-22 ENCOUNTER — Telehealth: Payer: Self-pay

## 2017-07-22 LAB — CULTURE, BLOOD (ROUTINE X 2)
CULTURE: NO GROWTH
Culture: NO GROWTH
Special Requests: ADEQUATE
Special Requests: ADEQUATE

## 2017-07-22 NOTE — Telephone Encounter (Signed)
Recieved Death Certificate from ___[employer]_______ Delivered/Placed __nurses Bin__________

## 2017-07-23 NOTE — Telephone Encounter (Signed)
Funeral home called and stated they were bringing another death cert due to there is a mistake on the one they brought. Funeral home picked up the first one but didn't bring another back. Called [facility] [place] and she states that this pt's death cert was to [facility] to [facility] and not pulmonary. Will close encounter.

## 2017-07-23 NOTE — Telephone Encounter (Signed)
Death cert given to DS to sign. 

## 2017-08-02 NOTE — Progress Notes (Signed)
I called son Peyton NajjarLarry to inform him of patient's respiratory changes and decreased oxygen levels. He stated hew appreciated the call and that he would call his sister to let her know as well. Anselm Junglingonyers,Javeah Loeza M

## 2017-08-02 NOTE — Progress Notes (Signed)
Patient passed away at 1910. RN pronounced. Post-mortem sheet completed. Lines and foley removed. Family was with patient.All belongings had gone home with family previously. Gown changed and care given.

## 2017-08-02 NOTE — Death Summary Note (Signed)
DEATH SUMMARY   Patient Details  Name: DANEEN VOLCY MRN: [id number] DOB: 10-Dec-1924  Admission/Discharge Information   Admit Date:  07-31-17  Date of Death: Date of Death: Aug 07, 2017  Time of Death: Time of Death: 1910  Length of Stay: 7  Referring Physician: Karie Schwalbe, MD   Reason(s) for Hospitalization  sepsis  Diagnoses  Preliminary cause of death:  Secondary Diagnoses (including complications and co-morbidities):  Active Problems:   Sepsis (HCC)   Respiratory failure (HCC)   Respiratory distress   Palliative care encounter   Terminal care   Brief Hospital Course (including significant findings, care, treatment, and services provided and events leading to death)  ENIOLA CERULLO i33 year old female with history of hypertension who was brought into the hospital due to urinary tract infection.  1. Escherichia coli sepsis due to UTI 2. Acute kidney injury due to nephrolithiasis with left hydronephrosis 3. Acute hypoxic respiratory failure status post extubation 4. Acute metabolic encephalopathy 5. Hypotension 6. Dementia  Patient had severe resp distress. Palliative care was consulted and family decided on comfort care as she was doing poorly.     Pertinent Labs and Studies  Significant Diagnostic Studies Dg Abd 1 View  Result Date: 07/31/2017 CLINICAL DATA:  Check gastric catheter placement EXAM: ABDOMEN - 1 VIEW COMPARISON:  07/12/2017 FINDINGS: Gastric catheter is noted in the stomach. Left ureteral stent is seen in satisfactory position. Proximal left ureteral stone is again identified. Nonobstructive bowel gas pattern is noted. Right femoral central line is noted. IMPRESSION: Gastric catheter within the stomach. Other tubes and lines as described. Stable proximal left ureteral stone. Electronically Signed   By: Alcide Clever M.D.   On: 07/31/17 19:39   Ct Head Wo Contrast  Result Date: July 31, 2017 CLINICAL DATA:  Patient found unresponsive. EXAM: CT  HEAD WITHOUT CONTRAST TECHNIQUE: Contiguous axial images were obtained from the base of the skull through the vertex without intravenous contrast. COMPARISON:  October 01, 2013 FINDINGS: Brain: No subdural, epidural, or subarachnoid hemorrhage. Cerebellum, brainstem, and basal cisterns are normal. White matter changes are stable. Ventricles and sulci are unremarkable. No mass effect or midline shift. No acute cortical ischemia or infarct. Vascular: No hyperdense vessel or unexpected calcification. Skull: Normal. Negative for fracture or focal lesion. Sinuses/Orbits: Mild mucosal thickening in the ethmoid sinuses. Paranasal sinuses, mastoid air cells, and middle ears are otherwise normal. Other: There is soft tissue swelling over the left side of the scalp which has increased since 2,015. IMPRESSION: 1. No acute intracranial abnormality identified. 2. Soft tissue thickening in the left scalp. Recommend clinical correlation. 3. Mild mucosal thickening in the ethmoid sinuses. Electronically Signed   By: Gerome Sam III M.D   On: July 31, 2017 10:40   US Renal  Result Date: 07/16/2017 CLINICAL DATA:  Left hydronephrosis due to  kidney stone. EXAM: RENAL / URINARY TRACT ULTRASOUND COMPLETE COMPARISON:  CT abdomen pelvis 07/12/2017 FINDINGS: Right Kidney: Length: 11.0 cm. 5.5 cm right lower pole cyst. Echogenicity within normal limits. No mass or hydronephrosis visualized. Left Kidney: Length: 12.2 cm.  Negative for hydronephrosis.  No renal mass. Bladder: Distended urinary bladder with Foley catheter. Question clamps catheter versus obstruction of the catheter. IMPRESSION: Negative for hydronephrosis. Mild hydronephrosis and obstructing stone noted on recent CT Urinary bladder distended despite Foley catheter. Question obstructed or clamped Foley catheter Electronically Signed   By: Marlan Palau M.D.   On: 07/16/2017 10:55   Dg Chest Port 1 View  Result Date: 07/16/2017 CLINICAL DATA:  Respiratory failure. EXAM:  PORTABLE CHEST 1 VIEW COMPARISON:  07/09/2017. FINDINGS: Interim removal of NG tube. Mediastinum and hilar structures normal. Cardiomegaly with normal pulmonary vascularity. Mild bibasilar atelectasis. Small left pleural effusion cannot be excluded. No pneumothorax. Degenerative changes thoracic spine. IMPRESSION: 1.  Interim removal of NG tube. 2.  Cardiomegaly with normal pulmonary vascularity. 3. Low lung volumes with bibasilar atelectasis. Small left pleural effusion cannot be excluded. Electronically Signed   By: Maisie Fus  Register   On: 07/16/2017 06:42   Dg Chest Port 1 View  Result Date: 07/30/2017 CLINICAL DATA:  Check endotracheal tube placement EXAM: PORTABLE CHEST 1 VIEW COMPARISON:  07/31/2017 FINDINGS: Endotracheal tube and nasogastric catheter are now seen in satisfactory position. Cardiac shadow is enlarged but stable. Right hemidiaphragm is again elevated but stable. No focal infiltrate or sizable effusion is noted. Previously seen changes in the bases were likely related incomplete aeration. IMPRESSION: Tubes and lines as described above. Improved aeration in the bases bilaterally. Electronically Signed   By: Alcide Clever M.D.   On: 07/10/2017 19:38   Dg Chest Portable 1 View  Result Date: 07/08/2017 CLINICAL DATA:  Hypoxia EXAM: PORTABLE CHEST 1 VIEW COMPARISON:  None. FINDINGS: Cardiomegaly. Elevation of the right hemidiaphragm. The left lower lobe airspace opacity, possible pneumonia. Suspect small left effusion. Right base atelectasis. IMPRESSION: Cardiomegaly. Left side effusion with left lower lobe atelectasis or pneumonia. Right base atelectasis. Electronically Signed   By: Charlett Nose M.D.   On: 07/23/2017 09:22   Ct Renal Stone Study  Result Date: 07/12/2017 CLINICAL DATA:  Sharp left flank pain EXAM: CT ABDOMEN AND PELVIS WITHOUT CONTRAST TECHNIQUE: Multidetector CT imaging of the abdomen and pelvis was performed following the standard protocol without IV contrast. COMPARISON:   None. FINDINGS: Lower chest: Cardiomegaly.  No acute findings. Hepatobiliary: No focal hepatic abnormality. Gallbladder unremarkable. Pancreas: No focal abnormality or ductal dilatation. Spleen: No focal abnormality.  Normal size. Adrenals/Urinary Tract: Mild to moderate left hydronephrosis and perinephric stranding due to 7 mm proximal left ureteral stone. No hydronephrosis on the right. Punctate nonobstructing stones in the lower pole of the left kidney. Adrenal glands and urinary bladder unremarkable.5.6 cm low-density lesion off the lower pole of the right kidney likely reflects a cyst although this cannot be characterized without intravenous contrast. Stomach/Bowel: Sigmoid diverticulosis. No active diverticulitis. Appendix is normal. Stomach and small bowel decompressed, unremarkable. Vascular/Lymphatic: Diffuse aortic and iliac calcifications. No evidence of aneurysm or adenopathy. Reproductive: Uterus and adnexa unremarkable.  No mass. Other: No free fluid or free air. Musculoskeletal: No acute bony abnormality. IMPRESSION: 7 mm proximal left ureteral stone with mild to moderate left hydronephrosis and perinephric stranding. Left lower pole punctate nonobstructing nephrolithiasis. Aortoiliac atherosclerosis. Sigmoid diverticulosis. Cardiomegaly. Electronically Signed   By: Charlett Nose M.D.   On: 07/12/2017 12:02    Microbiology Recent Results (from the past 240 hour(s))  Urine Culture     Status: Abnormal   Collection Time: 07/12/17 10:29 AM  Result Value Ref Range Status   Specimen Description   Final    URINE, CATHETERIZED Performed at [facility] [address], [place], [place] [address]    Special Requests   Final    NONE Performed at [facility], [address] [place], [place] [address]    Culture >=100,000 COLONIES/mL ESCHERICHIA COLI (A)  Final   Report Status 07/14/2017 FINAL  Final   Organism ID, Bacteria ESCHERICHIA COLI (A)  Final      Susceptibility  Escherichia coli - MIC*    AMPICILLIN >=32 RESISTANT Resistant     CEFAZOLIN 16 SENSITIVE Sensitive     CEFTRIAXONE <=1 SENSITIVE Sensitive     CIPROFLOXACIN <=0.25 SENSITIVE Sensitive     GENTAMICIN <=1 SENSITIVE Sensitive     IMIPENEM <=0.25 SENSITIVE Sensitive     NITROFURANTOIN 32 SENSITIVE Sensitive     TRIMETH/SULFA <=20 SENSITIVE Sensitive     AMPICILLIN/SULBACTAM >=32 RESISTANT Resistant     PIP/TAZO 8 SENSITIVE Sensitive     Extended ESBL NEGATIVE Sensitive     * >=100,000 COLONIES/mL ESCHERICHIA COLI  Blood culture (routine x 2)     Status: Abnormal   Collection Time: 30-Jul-2017  8:35 AM  Result Value Ref Range Status   Specimen Description   Final    BLOOD LEFT WRIST Performed at [facility], [address], [place], [place] [address]    Special Requests   Final    BOTTLES DRAWN AEROBIC AND ANAEROBIC Blood Culture adequate volume Performed at [facility], [address] [place], [place] [address]    Culture  Setup Time   Final    GRAM NEGATIVE RODS IN BOTH AEROBIC AND ANAEROBIC BOTTLES CRITICAL RESULT CALLED TO, READ BACK BY AND VERIFIED WITH: MATT MCBANE AT 0435 ON 07/14/17 RWW    Culture (A)  Final    ESCHERICHIA COLI SUSCEPTIBILITIES PERFORMED ON PREVIOUS CULTURE WITHIN THE LAST 5 DAYS. Performed at [facility], [address], [place], [place] [address]    Report Status 07/17/2017 FINAL  Final  Blood culture (routine x 2)     Status: Abnormal   Collection Time: 07-30-2017  8:35 AM  Result Value Ref Range Status   Specimen Description   Final    LEFT ANTECUBITAL Performed at [facility], [address] [place], [place] [address]    Special Requests   Final    BOTTLES DRAWN AEROBIC AND ANAEROBIC Blood Culture results may not be optimal due to an excessive volume of blood received in culture bottles Performed at [facility], [address] [place], [place] [address]    Culture  Setup Time   Final    GRAM  NEGATIVE RODS CRITICAL RESULT CALLED TO, READ BACK BY AND VERIFIED WITH: MATT MCBANE AT 0435 ON 07/14/17 RWW IN BOTH AEROBIC AND ANAEROBIC BOTTLES    Culture ESCHERICHIA COLI (A)  Final   Report Status 07/17/2017 FINAL  Final   Organism ID, Bacteria ESCHERICHIA COLI  Final      Susceptibility   Escherichia coli - MIC*    AMPICILLIN >=32 RESISTANT Resistant     CEFAZOLIN >=64 RESISTANT Resistant     CEFEPIME <=1 SENSITIVE Sensitive     CEFTAZIDIME <=1 SENSITIVE Sensitive     CEFTRIAXONE <=1 SENSITIVE Sensitive     CIPROFLOXACIN <=0.25 SENSITIVE Sensitive     GENTAMICIN <=1 SENSITIVE Sensitive     IMIPENEM <=0.25 SENSITIVE Sensitive     TRIMETH/SULFA <=20 SENSITIVE Sensitive     AMPICILLIN/SULBACTAM >=32 RESISTANT Resistant     PIP/TAZO 8 SENSITIVE Sensitive     Extended ESBL NEGATIVE Sensitive     * ESCHERICHIA COLI  Blood Culture ID Panel (Reflexed)     Status: Abnormal   Collection Time: 30-Jul-2017  8:35 AM  Result Value Ref Range Status   Enterococcus species NOT DETECTED NOT DETECTED Final   Listeria monocytogenes NOT DETECTED NOT DETECTED Final   Staphylococcus species NOT DETECTED NOT DETECTED Final   Staphylococcus aureus NOT  DETECTED NOT DETECTED Final   Streptococcus species NOT DETECTED NOT DETECTED Final   Streptococcus agalactiae NOT DETECTED NOT DETECTED Final   Streptococcus pneumoniae NOT DETECTED NOT DETECTED Final   Streptococcus pyogenes NOT DETECTED NOT DETECTED Final   Acinetobacter baumannii NOT DETECTED NOT DETECTED Final   Enterobacteriaceae species DETECTED (A) NOT DETECTED Final    Comment: Enterobacteriaceae represent a large family of gram-negative bacteria, not a single organism. CRITICAL RESULT CALLED TO, READ BACK BY AND VERIFIED WITH: MATT MCBANE AT 0435 ON 07/14/17 RWW    Enterobacter cloacae complex NOT DETECTED NOT DETECTED Final   Escherichia coli DETECTED (A) NOT DETECTED Final    Comment: CRITICAL RESULT CALLED TO, READ BACK BY AND VERIFIED  WITH: MATT MCBANE AT 0435 ON 07/14/17 RWW    Klebsiella oxytoca NOT DETECTED NOT DETECTED Final   Klebsiella pneumoniae NOT DETECTED NOT DETECTED Final   Proteus species NOT DETECTED NOT DETECTED Final   Serratia marcescens NOT DETECTED NOT DETECTED Final   Carbapenem resistance NOT DETECTED NOT DETECTED Final   Haemophilus influenzae NOT DETECTED NOT DETECTED Final   Neisseria meningitidis NOT DETECTED NOT DETECTED Final   Pseudomonas aeruginosa NOT DETECTED NOT DETECTED Final   Candida albicans NOT DETECTED NOT DETECTED Final   Candida glabrata NOT DETECTED NOT DETECTED Final   Candida krusei NOT DETECTED NOT DETECTED Final   Candida parapsilosis NOT DETECTED NOT DETECTED Final   Candida tropicalis NOT DETECTED NOT DETECTED Final    Comment: Performed at [facility], [address], [place], [place] [address]  Urine culture     Status: Abnormal   Collection Time: 08-07-2017 10:31 AM  Result Value Ref Range Status   Specimen Description   Final    URINE, CATHETERIZED Performed at [facility], [address] [place], [place] [address]    Special Requests   Final    NONE Performed at [facility], [address] [place], [place] [address]    Culture >=100,000 COLONIES/mL ESCHERICHIA COLI (A)  Final   Report Status 07/15/2017 FINAL  Final   Organism ID, Bacteria ESCHERICHIA COLI (A)  Final      Susceptibility   Escherichia coli - MIC*    AMPICILLIN >=32 RESISTANT Resistant     CEFAZOLIN 8 SENSITIVE Sensitive     CEFTRIAXONE <=1 SENSITIVE Sensitive     CIPROFLOXACIN <=0.25 SENSITIVE Sensitive     GENTAMICIN <=1 SENSITIVE Sensitive     IMIPENEM <=0.25 SENSITIVE Sensitive     NITROFURANTOIN <=16 SENSITIVE Sensitive     TRIMETH/SULFA <=20 SENSITIVE Sensitive     AMPICILLIN/SULBACTAM >=32 RESISTANT Resistant     PIP/TAZO <=4 SENSITIVE Sensitive     Extended ESBL NEGATIVE Sensitive     * >=100,000 COLONIES/mL ESCHERICHIA COLI  MRSA PCR Screening      Status: None   Collection Time: 08-07-2017  6:27 PM  Result Value Ref Range Status   MRSA by PCR NEGATIVE NEGATIVE Final    Comment:        The GeneXpert MRSA Assay (FDA approved for NASAL specimens only), is one component of a comprehensive MRSA colonization surveillance program. It is not intended to diagnose MRSA infection nor to guide or monitor treatment for MRSA infections. Performed at [facility], [address] [place], [place] [address]   Urine Culture     Status: None   Collection Time: 07/17/17  2:06 AM  Result Value Ref Range Status   Specimen Description   Final  URINE, RANDOM Performed at [facility], [address] [place], [place] [address]    Special Requests   Final    NONE Performed at [facility], [address] [place], [place] [address]    Culture   Final    NO GROWTH Performed at [facility], [address] [place], [place] [address]    Report Status 07/18/2017 FINAL  Final  Culture, blood (Routine X 2) w Reflex to ID Panel     Status: None (Preliminary result)   Collection Time: 07/17/17  2:07 AM  Result Value Ref Range Status   Specimen Description BLOOD RIGHT [facility]  Final   Special Requests   Final    BOTTLES DRAWN AEROBIC AND ANAEROBIC Blood Culture adequate volume   Culture   Final    NO GROWTH 4 DAYS Performed at [facility], [address] [place], [place] [address]    Report Status PENDING  Incomplete  Culture, blood (Routine X 2) w Reflex to ID Panel     Status: None (Preliminary result)   Collection Time: 07/17/17  2:12 AM  Result Value Ref Range Status   Specimen Description BLOOD RIGHT FORE ARM  Final   Special Requests   Final    BOTTLES DRAWN AEROBIC AND ANAEROBIC Blood Culture adequate volume   Culture   Final    NO GROWTH 4 DAYS Performed at [facility], [address] [place], [place] [address]    Report Status PENDING  Incomplete    Lab Basic Metabolic  Panel: Recent Labs  Lab 07/15/17 0527 07/16/17 0627 07/17/17 0016 07/18/17 0500  NA 138 143 145 148*  K 4.9 5.0 3.8 3.7  CL 109 116* 116* 115*  CO2 15* 16* 16* 24  GLUCOSE 76 94 128* 189*  BUN 68* 78* 70* 38*  CREATININE 2.88* 2.21* 1.60* 0.95  CALCIUM 8.2* 8.8* 9.0 9.1  MG 1.8  --  1.9  --   PHOS 5.4*  --  3.0  --    Liver Function Tests: Recent Labs  Lab 07/16/17 0627  AST 20  ALT 47  ALKPHOS 103  BILITOT 1.2  PROT 5.0*  ALBUMIN 2.6*   No results for input(s): LIPASE, AMYLASE in the last 168 hours. No results for input(s): AMMONIA in the last 168 hours. CBC: Recent Labs  Lab 07/15/17 0527 07/16/17 0627 07/17/17 0016 07/18/17 0500  WBC 28.1* 36.2* 52.8* 45.0*  HGB 12.1 13.4 12.5 12.8  HCT 37.8 41.2 39.3 39.3  MCV 93.6 92.8 91.7 91.3  PLT 52* 53* 58* 67*   Cardiac Enzymes: No results for input(s): CKTOTAL, CKMB, CKMBINDEX, TROPONINI in the last 168 hours. Sepsis Labs: Recent Labs  Lab 07/15/17 0527 07/16/17 0627 07/17/17 0016 07/17/17 0054 07/18/17 0500  PROCALCITON  --   --  3.02  --  0.79  WBC 28.1* 36.2* 52.8*  --  45.0*  LATICACIDVEN  --   --   --  1.1  --     Procedures/Operations  extubation   Khadijah Mastrianni 07/21/2017, 6:40 AM

## 2017-08-02 NOTE — Plan of Care (Signed)
Comfort measures-turning Q2h, mouth care and dialudid/ativan as requested by family and/or per nurse discretion.

## 2017-08-02 NOTE — Progress Notes (Signed)
Patient turned to her left side. Patient febrile 102 axillary, tylenol suppository given.  Anselm Junglingonyers,Lyndie Vanderloop M

## 2017-08-02 NOTE — Progress Notes (Signed)
[facility] [facility] [place] [facility]   PATIENT NAME: Napoleon Formlaine Flanary    MR#:  [id number]  DATE OF BIRTH:  08-05-1924  SUBJECTIVE:  Patient on comfort care family at bedside  REVIEW OF SYSTEMS:    Unable to perform      DRUG ALLERGIES:   Allergies  Allergen Reactions  . Buspirone Hcl     REACTION: Tinnitis, dizzy  . Oxybutynin Chloride     REACTION: Dried up  . Tolterodine Tartrate     REACTION: Headache    VITALS:  Blood pressure (!) 133/39, pulse 82, temperature 99.9 F (37.7 C), temperature source Oral, resp. rate (!) 25, height 5\' 2"  (1.575 m), weight 69.4 kg (153 lb 1.6 oz), SpO2 91 %.  PHYSICAL EXAMINATION:  Constitutional: frail tachypnic    LABORATORY PANEL:   CBC Recent Labs  Lab 07/18/17 0500  WBC 45.0*  HGB 12.8  HCT 39.3  PLT 67*   ------------------------------------------------------------------------------------------------------------------  Chemistries  Recent Labs  Lab 07/16/17 0627 07/17/17 0016 07/18/17 0500  NA 143 145 148*  K 5.0 3.8 3.7  CL 116* 116* 115*  CO2 16* 16* 24  GLUCOSE 94 128* 189*  BUN 78* 70* 38*  CREATININE 2.21* 1.60* 0.95  CALCIUM 8.8* 9.0 9.1  MG  --  1.9  --   AST 20  --   --   ALT 47  --   --   ALKPHOS 103  --   --   BILITOT 1.2  --   --    ------------------------------------------------------------------------------------------------------------------  Cardiac Enzymes Recent Labs  Lab 07/05/2017 2039  TROPONINI 1.07*   ------------------------------------------------------------------------------------------------------------------  RADIOLOGY:  No results found.   ASSESSMENT AND PLAN:    84 year old female with history of hypertension who was brought into the hospital due to urinary tract infection.  1. Escherichia coli sepsis due to UTI 2. Acute kidney injury due to nephrolithiasis with left hydronephrosis 3. Acute hypoxic respiratory failure status post extubation 4.  Acute metabolic encephalopathy 5. Hypotension 6. Dementia  Patient is on comfort care        CODE STATUS: dnr  TOTAL TIME TAKING CARE OF THIS PATIENT: 5 minutes.     POSSIBLE D/C anytime,   Coy Rochford M.D on 27-May-2018 at 10:10 AM  Between 7am to 6pm - Pager - [phone] After 6pm go to [email or web address] - password [email or web address]  [facility]  Office  [phone]  CC: Primary care physician; Karie SchwalbeLetvak, Richard I, MD  Note: This dictation was prepared with Dragon dictation along with smaller phrase technology. Any transcriptional errors that result from this process are unintentional.

## 2017-08-02 DEATH — deceased

## 2018-06-02 ENCOUNTER — Encounter: Payer: PPO | Admitting: Internal Medicine

## 2019-08-20 IMAGING — US US RENAL
1 series · 14 of 25 positions shown · non-contrast
Comparison: CT abdomen pelvis 07/12/2017

CLINICAL DATA: Left hydronephrosis due to  kidney stone.

EXAM:
RENAL / URINARY TRACT ULTRASOUND COMPLETE

[Series 1: us renal · 38 acquisitions, 14 frames shown]
[im 1/38]
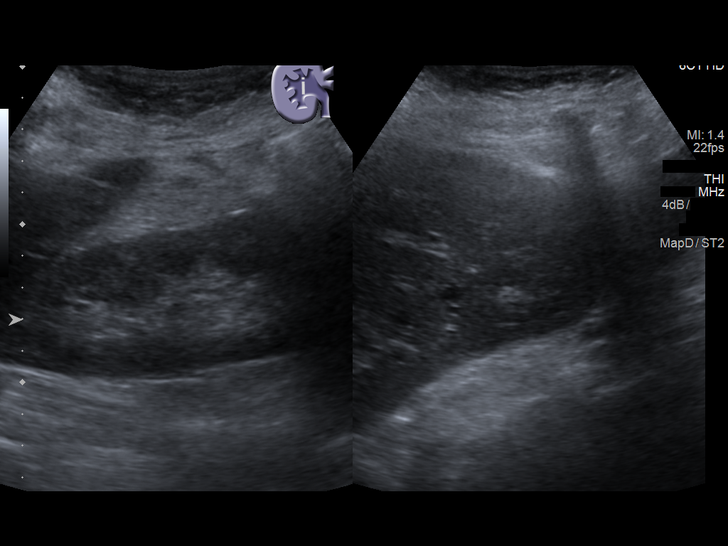
[im 4/38]
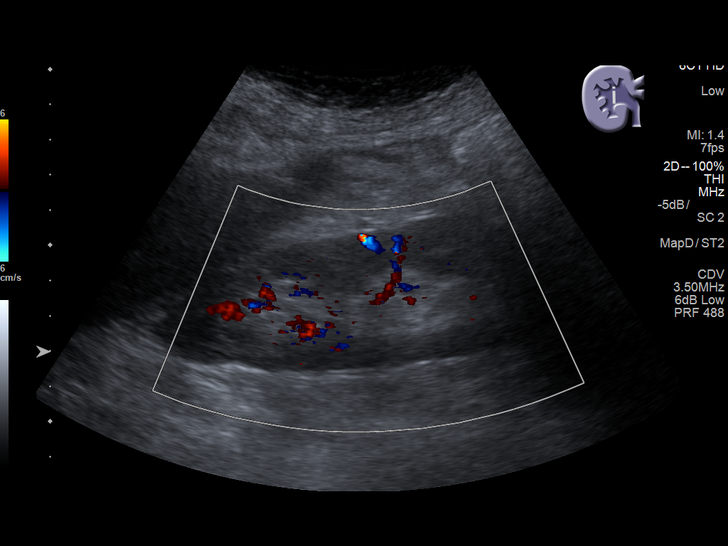
[im 7/38]
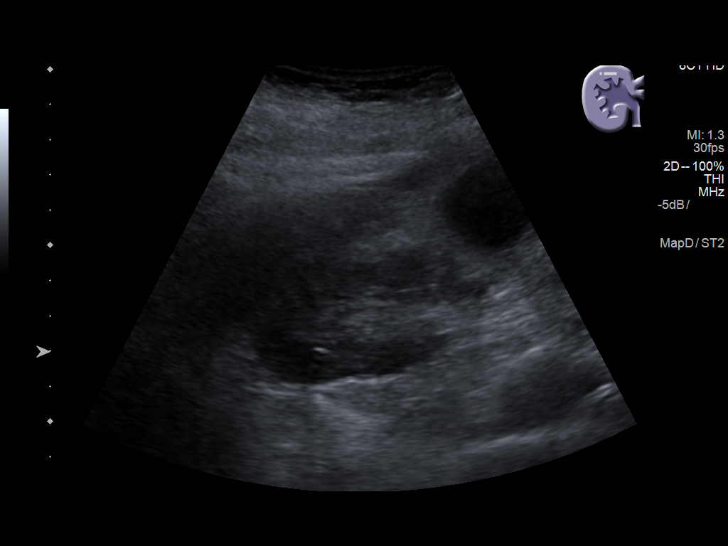
[im 10/38]
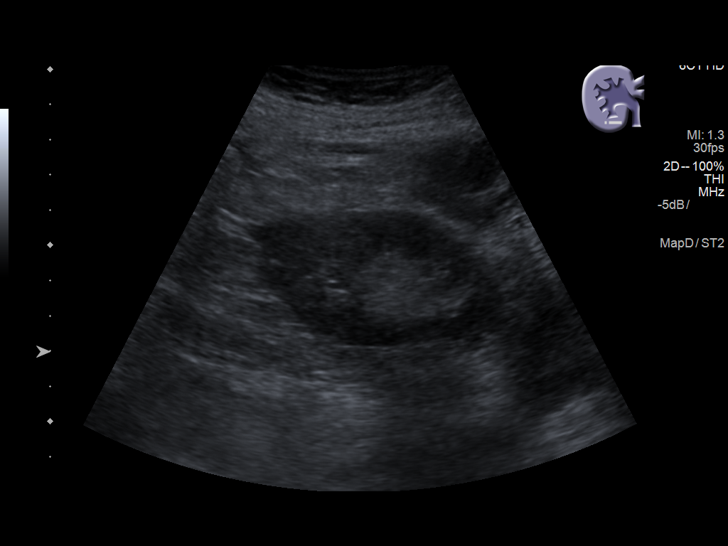
[im 13/38]
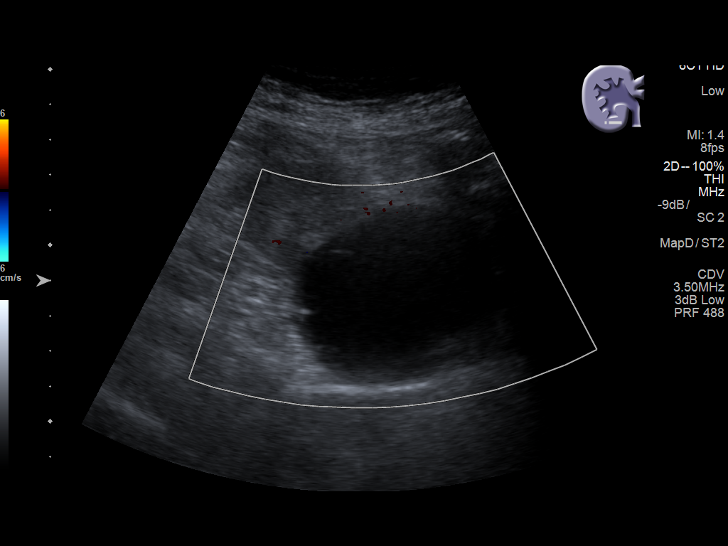
[im 14/38]
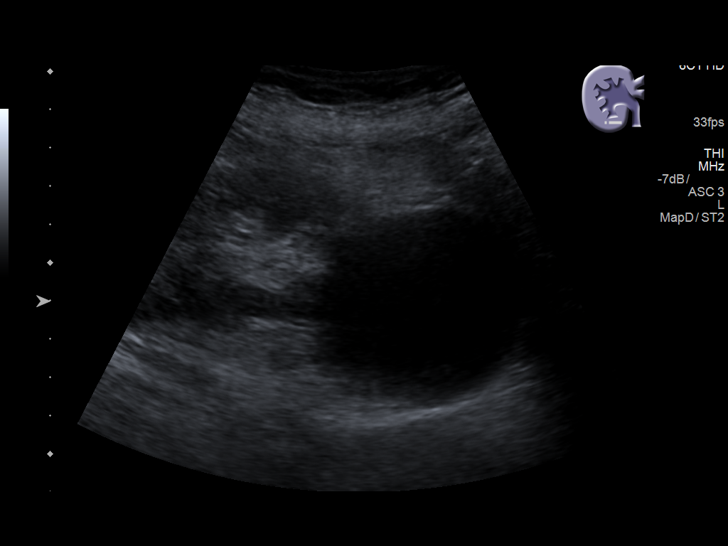
[im 17/38]
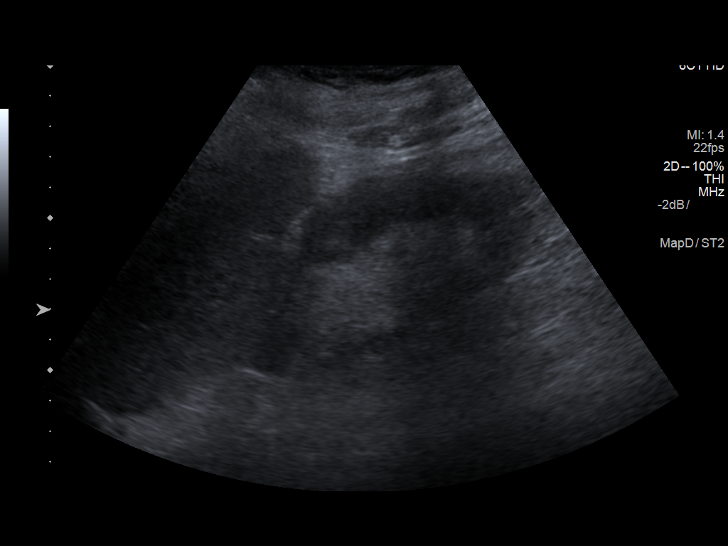
[im 21/38]
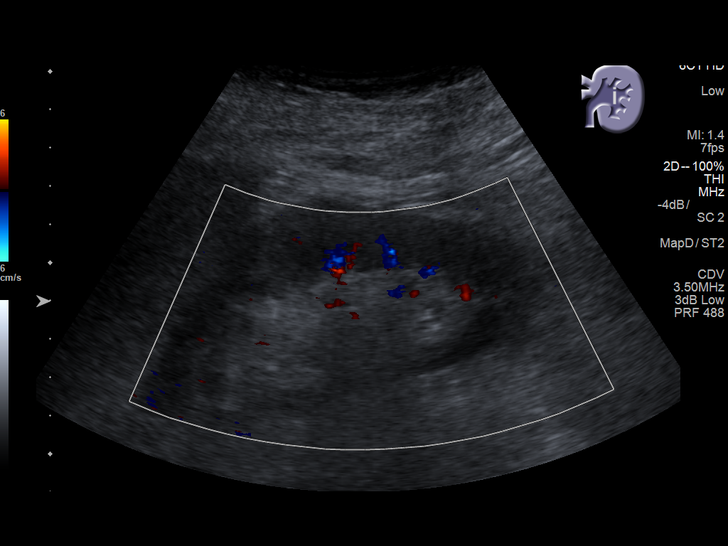
[im 24/38]
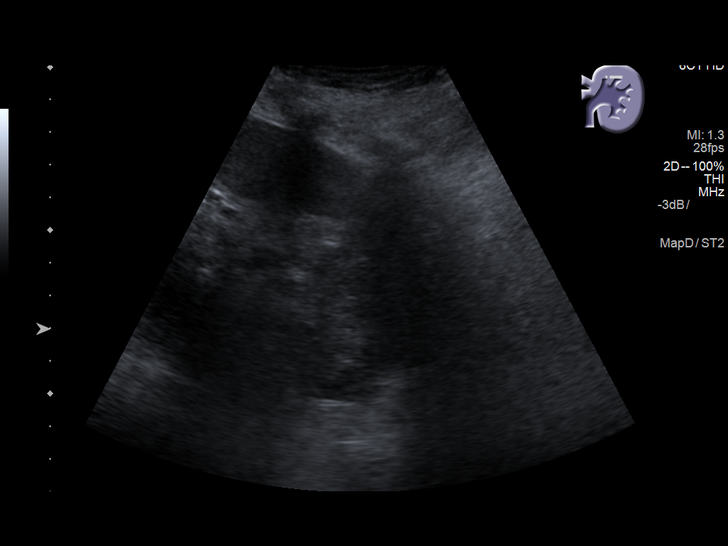
[im 25/38]
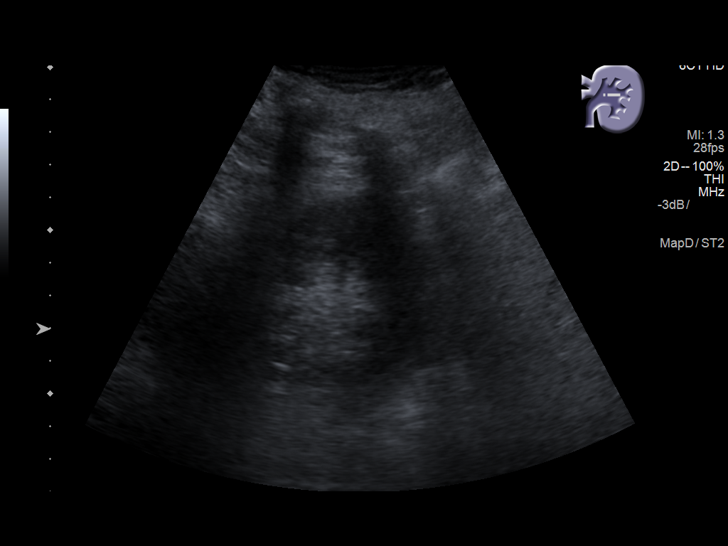
[im 28/38]
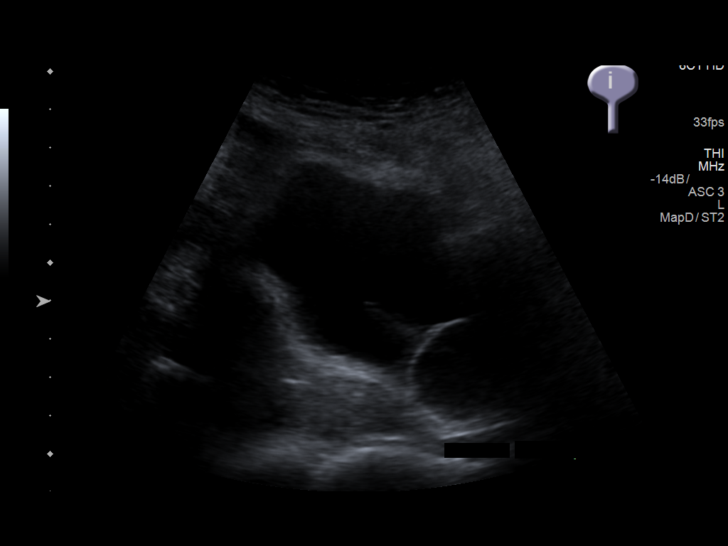
[im 31/38]
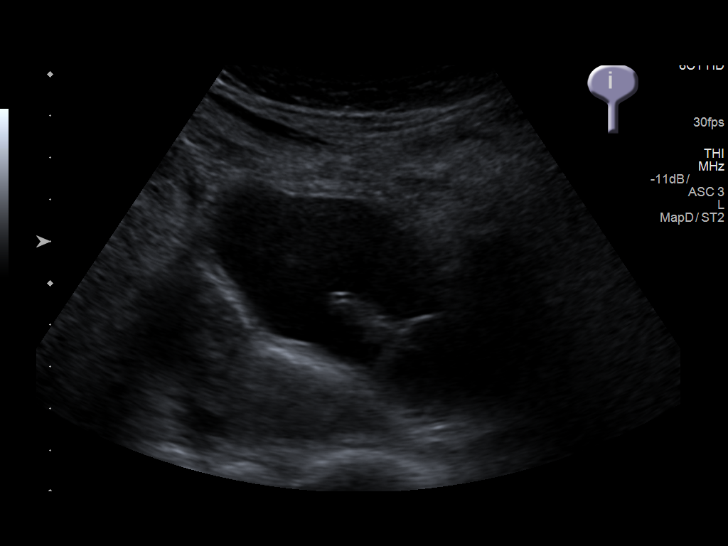
[im 34/38]
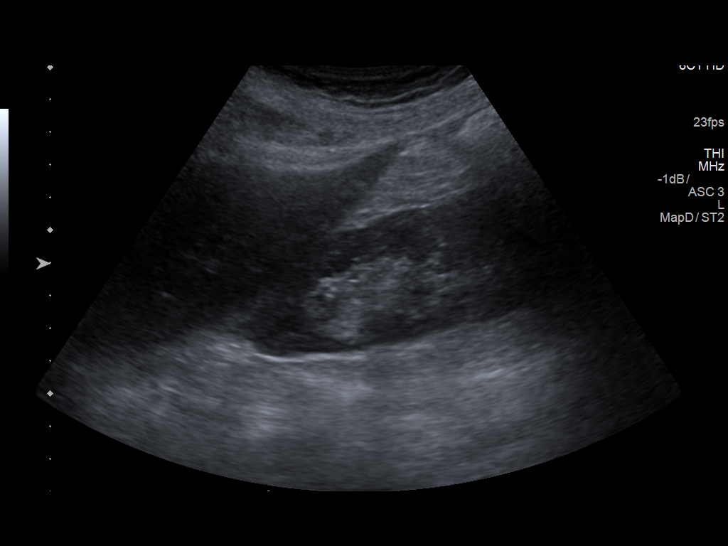
[im 38/38]
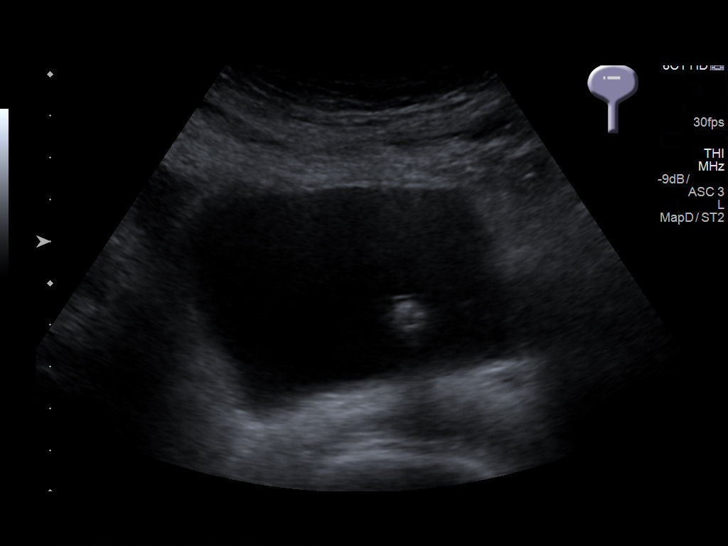

[14 of 25 positions shown; findings below may reference images not displayed]

FINDINGS: Right Kidney:

Length: 11.0 cm. 5.5 cm right lower pole cyst.. Echogenicity within
normal limits. No mass or hydronephrosis visualized.

Left Kidney:

Length: 12.2 cm.  Negative for hydronephrosis.  No renal mass.

Bladder:

Distended urinary bladder with Foley catheter. Question clamps
catheter versus obstruction of the catheter.
IMPRESSION: Negative for hydronephrosis. Mild hydronephrosis and obstructing
stone noted on recent CT

Urinary bladder distended despite Foley catheter. Question
obstructed or clamped Foley catheter

## 2019-12-01 IMAGING — CT CT RENAL STONE PROTOCOL
2 of 4 series · 16 of 46 positions shown, 18 images · non-contrast
Comparison: None.

CLINICAL DATA: Sharp left flank pain

EXAM:
CT ABDOMEN AND PELVIS WITHOUT CONTRAST
TECHNIQUE: Multidetector CT imaging of the abdomen and pelvis was performed
following the standard protocol without IV contrast.

[Series 2: stone full standard · axial · 0.73mm/px · z∈[-1049,-664]mm · 13 of 85 slices shown, 15 images]
[im 4/85  soft-tissue]
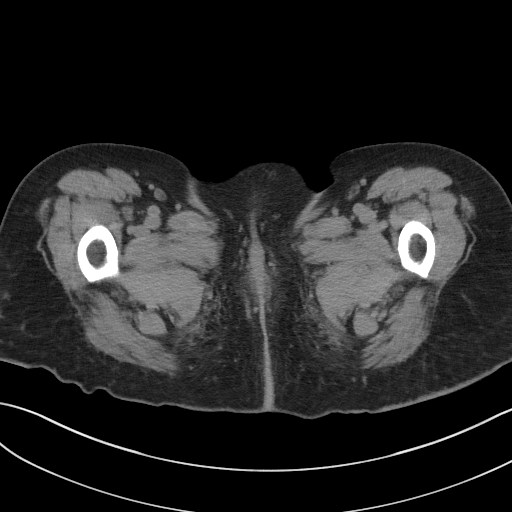
[im 4/85  bone]
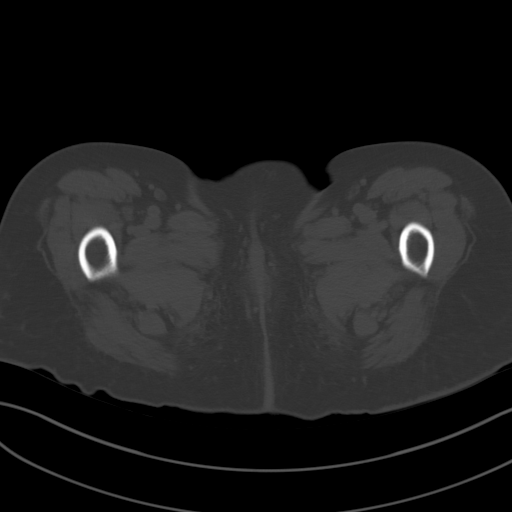
[im 11/85  soft-tissue]
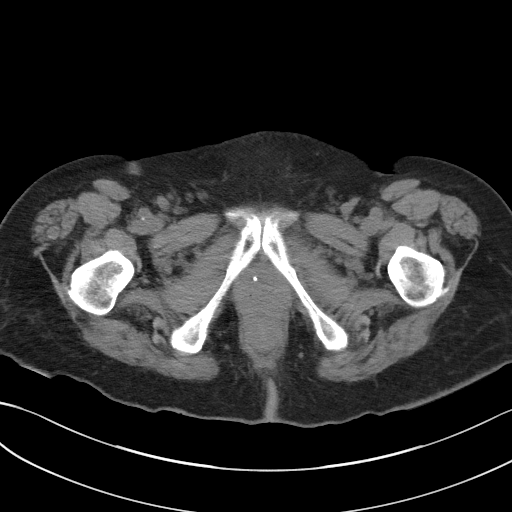
[im 18/85  soft-tissue]
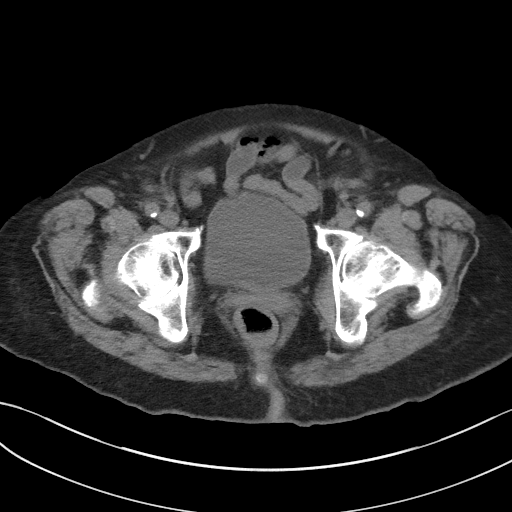
[im 25/85  soft-tissue]
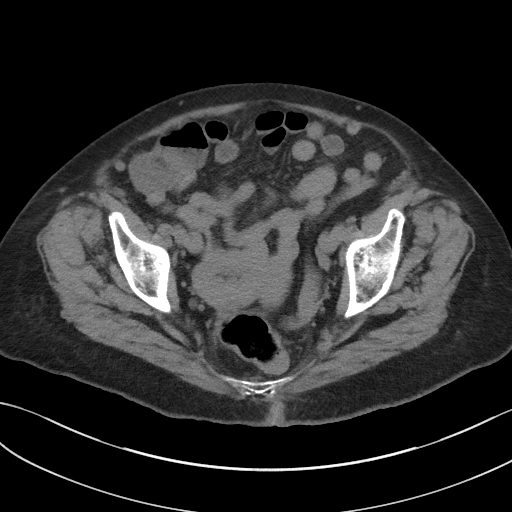
[im 29/85  soft-tissue]
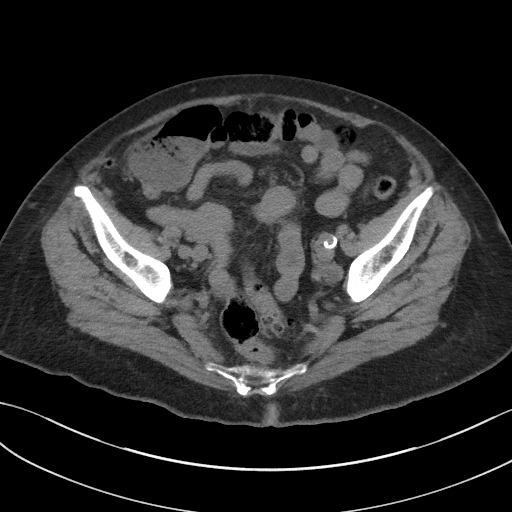
[im 36/85  soft-tissue]
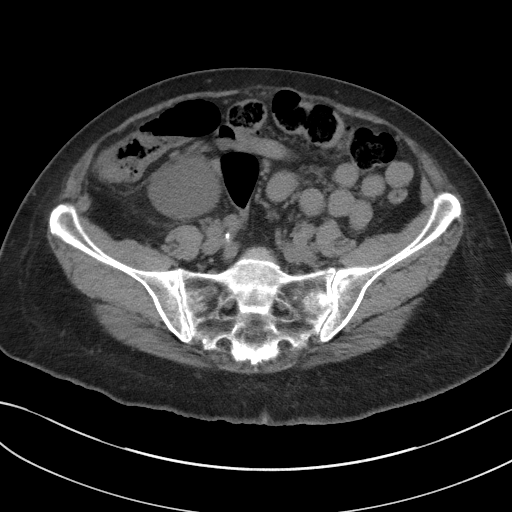
[im 43/85  soft-tissue]
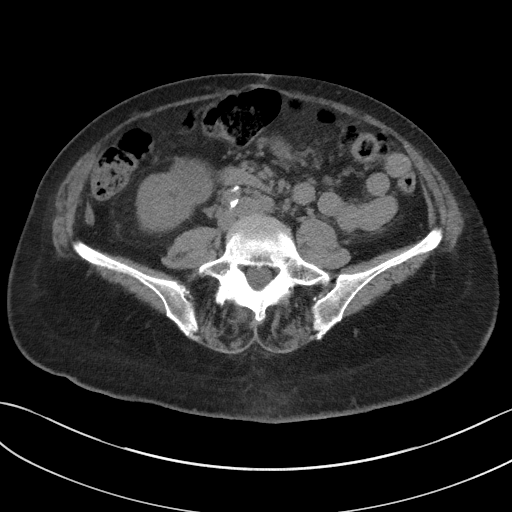
[im 50/85  soft-tissue]
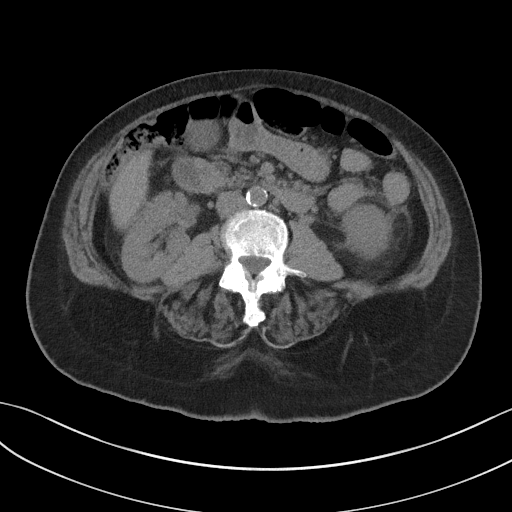
[im 57/85  soft-tissue]
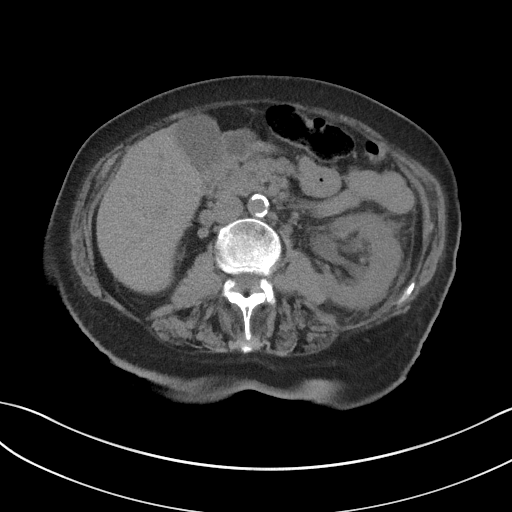
[im 57/85  bone]
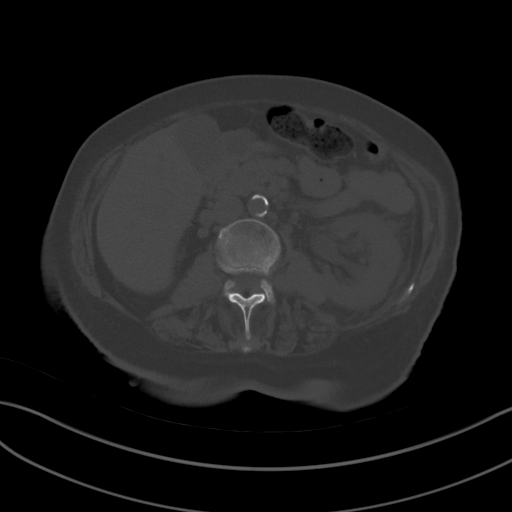
[im 60/85  soft-tissue]
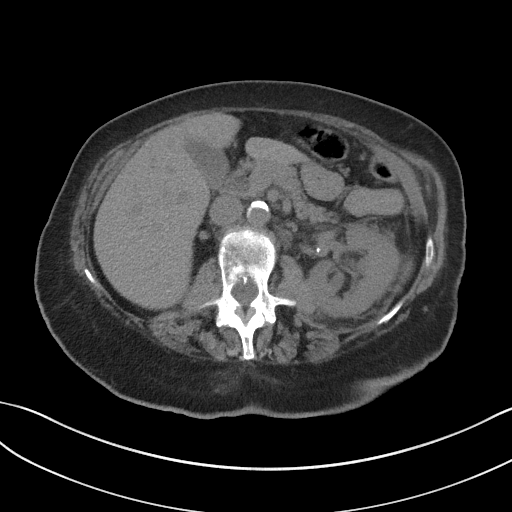
[im 67/85  soft-tissue]
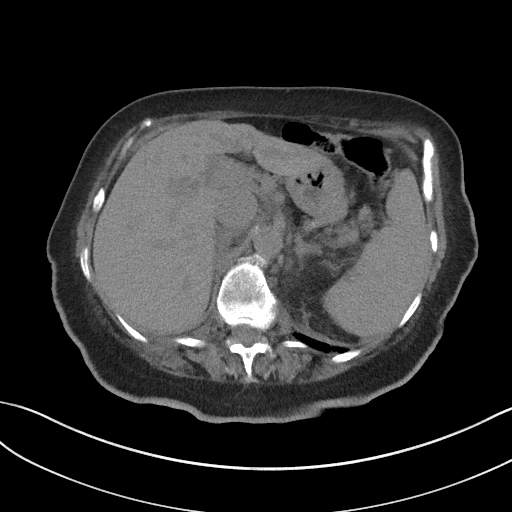
[im 74/85  soft-tissue]
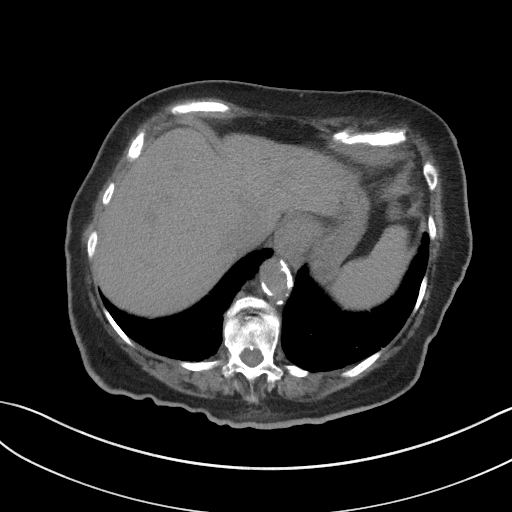
[im 81/85  soft-tissue]
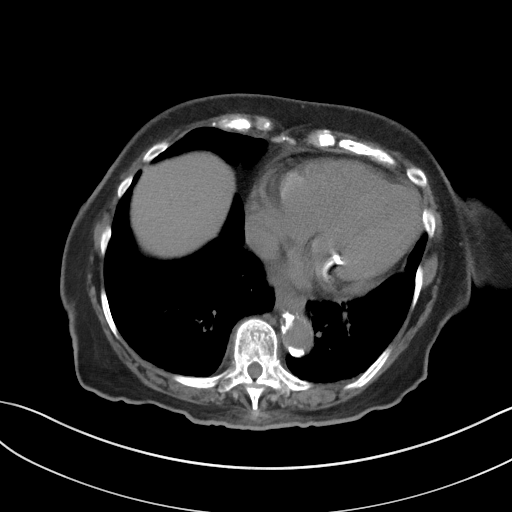

[Series 5: coronal · coronal · 0.75mm/px · 3 of 134 slices shown]
[im 45/134  soft-tissue]
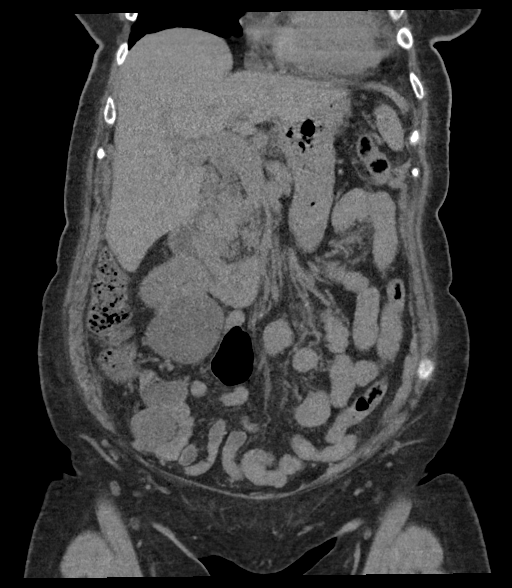
[im 60/134  soft-tissue]
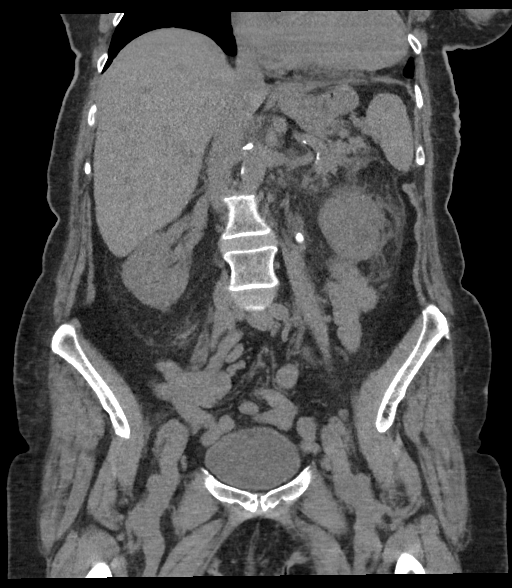
[im 74/134  soft-tissue]
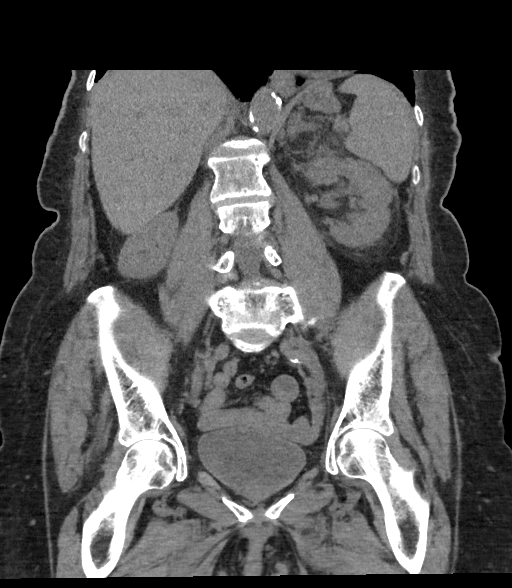

[16 of 46 positions shown; findings below may reference images not displayed]

FINDINGS: Lower chest: Cardiomegaly.  No acute findings.

Hepatobiliary: No focal hepatic abnormality. Gallbladder
unremarkable.

Pancreas: No focal abnormality or ductal dilatation.

Spleen: No focal abnormality.  Normal size.

Adrenals/Urinary Tract: Mild to moderate left hydronephrosis and
perinephric stranding due to 7 mm proximal left ureteral stone. No
hydronephrosis on the right. Punctate nonobstructing stones in the
lower pole of the left kidney. Adrenal glands and urinary bladder
unremarkable.5.6 cm low-density lesion off the lower pole of the
right kidney likely reflects a cyst although this cannot be
characterized without intravenous contrast.

Stomach/Bowel: Sigmoid diverticulosis. No active diverticulitis.
Appendix is normal. Stomach and small bowel decompressed,
unremarkable.

Vascular/Lymphatic: Diffuse aortic and iliac calcifications. No
evidence of aneurysm or adenopathy.

Reproductive: Uterus and adnexa unremarkable.  No mass.

Other: No free fluid or free air.

Musculoskeletal: No acute bony abnormality.
IMPRESSION: 7 mm proximal left ureteral stone with mild to moderate left
hydronephrosis and perinephric stranding.

Left lower pole punctate nonobstructing nephrolithiasis.

Aortoiliac atherosclerosis.

Sigmoid diverticulosis.

Cardiomegaly.

## 2019-12-02 IMAGING — DX DG CHEST 1V PORT
1 series · 1 of 1 positions shown · non-contrast
Comparison: None.

CLINICAL DATA: Hypoxia

EXAM:
PORTABLE CHEST 1 VIEW

[chest ap]
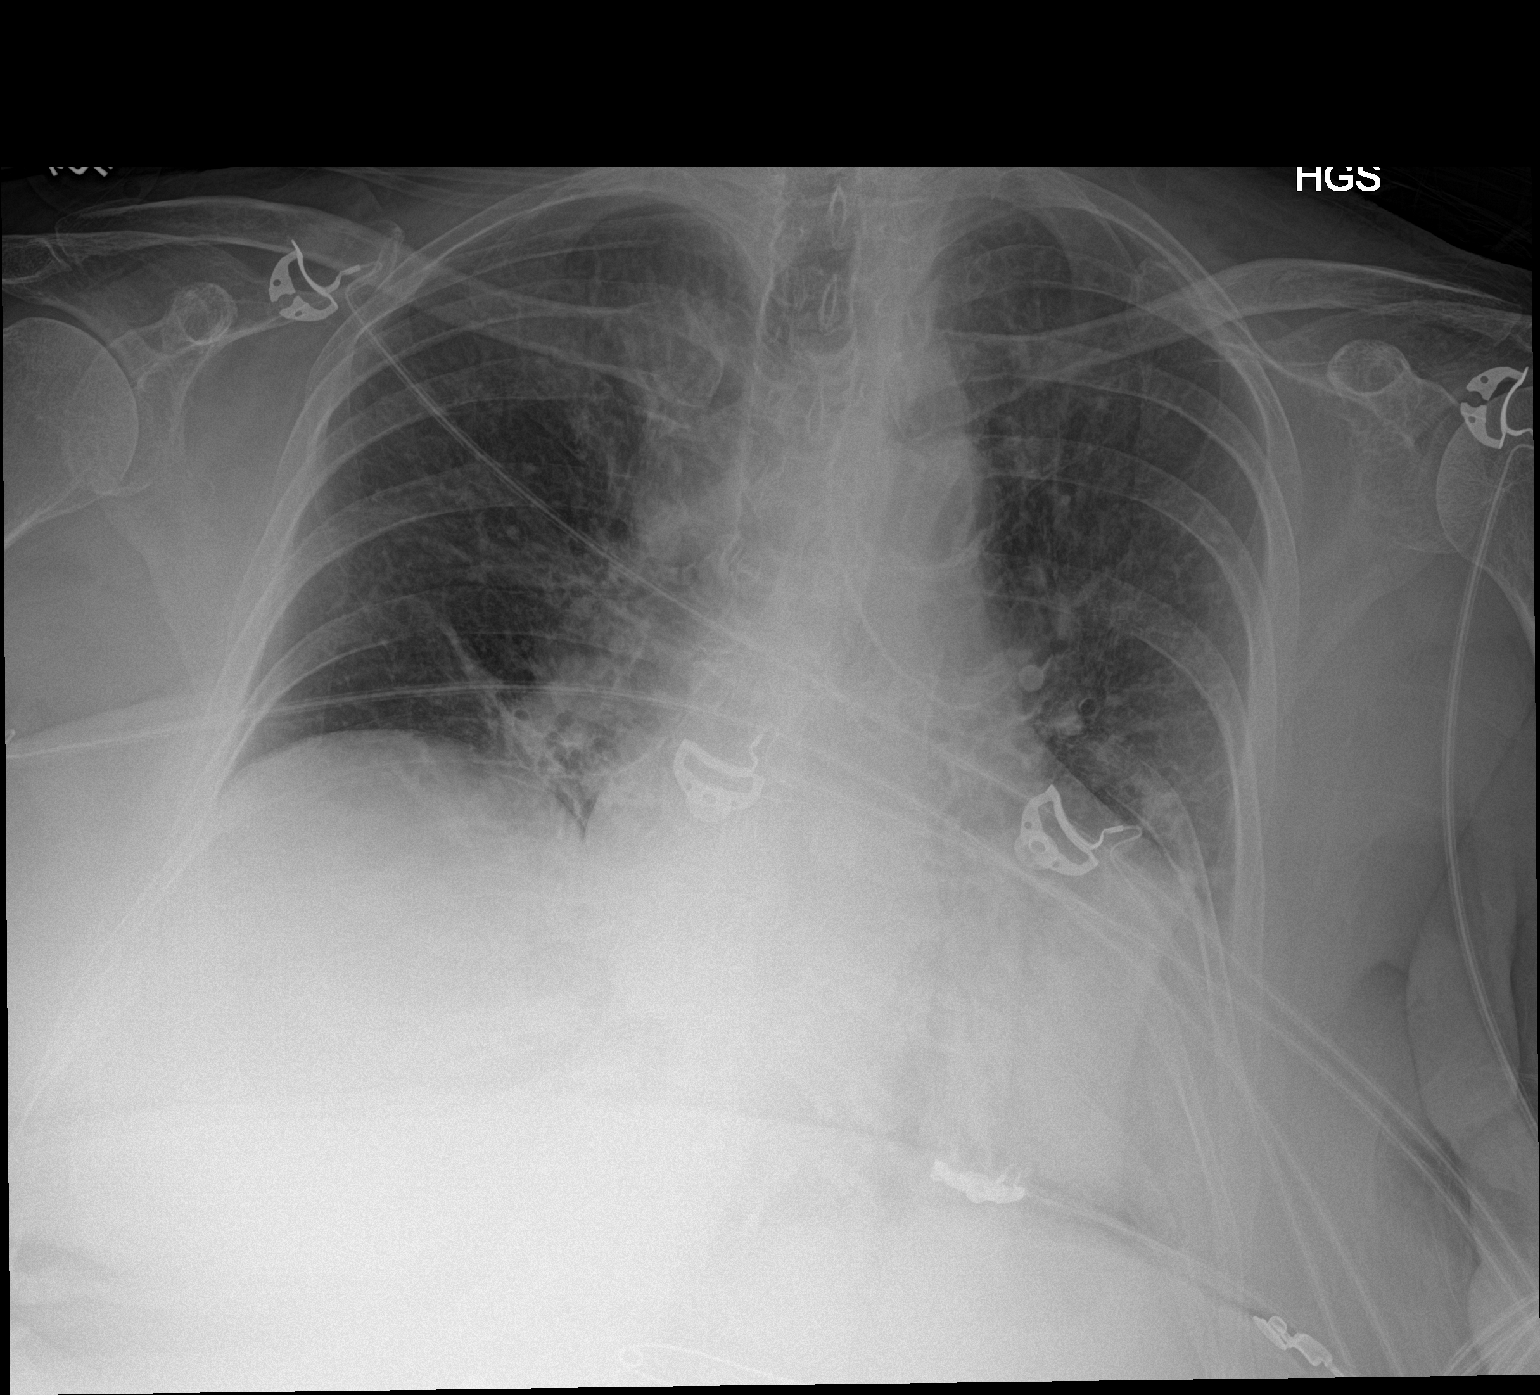

[1 of 1 positions shown; findings below may reference images not displayed]

FINDINGS: Cardiomegaly. Elevation of the right hemidiaphragm. The left lower
lobe airspace opacity, possible pneumonia. Suspect small left
effusion. Right base atelectasis.
IMPRESSION: Cardiomegaly. Left side effusion with left lower lobe atelectasis or
pneumonia.

Right base atelectasis.
